# Patient Record
Sex: Female | Born: 1950 | Race: Black or African American | Hispanic: No | Smoking: Current every day smoker
Health system: Southern US, Community
[De-identification: ages and names within clinical notes are randomized; demographics above are authoritative.]

## PROBLEM LIST (undated history)

## (undated) DIAGNOSIS — N183 Chronic kidney disease, stage 3 unspecified: Secondary | ICD-10-CM

## (undated) DIAGNOSIS — I447 Left bundle-branch block, unspecified: Secondary | ICD-10-CM

## (undated) DIAGNOSIS — I10 Essential (primary) hypertension: Secondary | ICD-10-CM

## (undated) DIAGNOSIS — I340 Nonrheumatic mitral (valve) insufficiency: Secondary | ICD-10-CM

## (undated) DIAGNOSIS — I5022 Chronic systolic (congestive) heart failure: Secondary | ICD-10-CM

## (undated) DIAGNOSIS — I2510 Atherosclerotic heart disease of native coronary artery without angina pectoris: Secondary | ICD-10-CM

## (undated) DIAGNOSIS — I428 Other cardiomyopathies: Secondary | ICD-10-CM

## (undated) HISTORY — DX: Chronic kidney disease, stage 3 unspecified: N18.30

## (undated) HISTORY — DX: Left bundle-branch block, unspecified: I44.7

## (undated) HISTORY — DX: Nonrheumatic mitral (valve) insufficiency: I34.0

## (undated) HISTORY — DX: Other cardiomyopathies: I42.8

## (undated) HISTORY — DX: Atherosclerotic heart disease of native coronary artery without angina pectoris: I25.10

## (undated) HISTORY — DX: Chronic systolic (congestive) heart failure: I50.22

---

## 2004-08-30 ENCOUNTER — Emergency Department: Payer: Self-pay | Admitting: General Practice

## 2020-11-25 ENCOUNTER — Inpatient Hospital Stay
Admission: EM | Admit: 2020-11-25 | Discharge: 2020-12-09 | DRG: 286 | Disposition: A | Discharge status: Home / Self Care | Payer: Medicare PPO | Attending: Internal Medicine | Admitting: Internal Medicine

## 2020-11-25 ENCOUNTER — Emergency Department: Payer: Medicare PPO

## 2020-11-25 ENCOUNTER — Encounter: Payer: Self-pay | Admitting: Emergency Medicine

## 2020-11-25 ENCOUNTER — Other Ambulatory Visit: Payer: Self-pay

## 2020-11-25 DIAGNOSIS — K769 Liver disease, unspecified: Secondary | ICD-10-CM | POA: Diagnosis present

## 2020-11-25 DIAGNOSIS — I959 Hypotension, unspecified: Secondary | ICD-10-CM | POA: Diagnosis not present

## 2020-11-25 DIAGNOSIS — I5031 Acute diastolic (congestive) heart failure: Secondary | ICD-10-CM | POA: Diagnosis not present

## 2020-11-25 DIAGNOSIS — I43 Cardiomyopathy in diseases classified elsewhere: Secondary | ICD-10-CM | POA: Diagnosis present

## 2020-11-25 DIAGNOSIS — I248 Other forms of acute ischemic heart disease: Secondary | ICD-10-CM | POA: Diagnosis present

## 2020-11-25 DIAGNOSIS — R0781 Pleurodynia: Secondary | ICD-10-CM | POA: Diagnosis not present

## 2020-11-25 DIAGNOSIS — I5021 Acute systolic (congestive) heart failure: Secondary | ICD-10-CM | POA: Diagnosis not present

## 2020-11-25 DIAGNOSIS — Z79899 Other long term (current) drug therapy: Secondary | ICD-10-CM | POA: Diagnosis not present

## 2020-11-25 DIAGNOSIS — I5043 Acute on chronic combined systolic (congestive) and diastolic (congestive) heart failure: Secondary | ICD-10-CM | POA: Diagnosis present

## 2020-11-25 DIAGNOSIS — R000 Tachycardia, unspecified: Secondary | ICD-10-CM | POA: Diagnosis not present

## 2020-11-25 DIAGNOSIS — I5041 Acute combined systolic (congestive) and diastolic (congestive) heart failure: Secondary | ICD-10-CM | POA: Diagnosis not present

## 2020-11-25 DIAGNOSIS — E669 Obesity, unspecified: Secondary | ICD-10-CM | POA: Diagnosis present

## 2020-11-25 DIAGNOSIS — N170 Acute kidney failure with tubular necrosis: Secondary | ICD-10-CM | POA: Diagnosis not present

## 2020-11-25 DIAGNOSIS — Z8249 Family history of ischemic heart disease and other diseases of the circulatory system: Secondary | ICD-10-CM | POA: Diagnosis not present

## 2020-11-25 DIAGNOSIS — Z20822 Contact with and (suspected) exposure to covid-19: Secondary | ICD-10-CM | POA: Diagnosis present

## 2020-11-25 DIAGNOSIS — I2721 Secondary pulmonary arterial hypertension: Secondary | ICD-10-CM | POA: Diagnosis present

## 2020-11-25 DIAGNOSIS — I083 Combined rheumatic disorders of mitral, aortic and tricuspid valves: Secondary | ICD-10-CM | POA: Diagnosis present

## 2020-11-25 DIAGNOSIS — I2510 Atherosclerotic heart disease of native coronary artery without angina pectoris: Secondary | ICD-10-CM | POA: Diagnosis present

## 2020-11-25 DIAGNOSIS — I447 Left bundle-branch block, unspecified: Secondary | ICD-10-CM | POA: Diagnosis not present

## 2020-11-25 DIAGNOSIS — I160 Hypertensive urgency: Secondary | ICD-10-CM | POA: Diagnosis not present

## 2020-11-25 DIAGNOSIS — I420 Dilated cardiomyopathy: Secondary | ICD-10-CM | POA: Diagnosis not present

## 2020-11-25 DIAGNOSIS — Y92239 Unspecified place in hospital as the place of occurrence of the external cause: Secondary | ICD-10-CM | POA: Diagnosis not present

## 2020-11-25 DIAGNOSIS — R0602 Shortness of breath: Secondary | ICD-10-CM | POA: Diagnosis not present

## 2020-11-25 DIAGNOSIS — R7989 Other specified abnormal findings of blood chemistry: Secondary | ICD-10-CM | POA: Diagnosis present

## 2020-11-25 DIAGNOSIS — T501X5A Adverse effect of loop [high-ceiling] diuretics, initial encounter: Secondary | ICD-10-CM | POA: Diagnosis not present

## 2020-11-25 DIAGNOSIS — I130 Hypertensive heart and chronic kidney disease with heart failure and stage 1 through stage 4 chronic kidney disease, or unspecified chronic kidney disease: Secondary | ICD-10-CM | POA: Diagnosis present

## 2020-11-25 DIAGNOSIS — J90 Pleural effusion, not elsewhere classified: Secondary | ICD-10-CM

## 2020-11-25 DIAGNOSIS — R778 Other specified abnormalities of plasma proteins: Secondary | ICD-10-CM | POA: Diagnosis present

## 2020-11-25 DIAGNOSIS — I340 Nonrheumatic mitral (valve) insufficiency: Secondary | ICD-10-CM | POA: Diagnosis not present

## 2020-11-25 DIAGNOSIS — F17213 Nicotine dependence, cigarettes, with withdrawal: Secondary | ICD-10-CM | POA: Diagnosis not present

## 2020-11-25 DIAGNOSIS — R52 Pain, unspecified: Secondary | ICD-10-CM

## 2020-11-25 DIAGNOSIS — I10 Essential (primary) hypertension: Secondary | ICD-10-CM | POA: Diagnosis not present

## 2020-11-25 DIAGNOSIS — F17200 Nicotine dependence, unspecified, uncomplicated: Secondary | ICD-10-CM | POA: Diagnosis present

## 2020-11-25 DIAGNOSIS — F17210 Nicotine dependence, cigarettes, uncomplicated: Secondary | ICD-10-CM | POA: Diagnosis present

## 2020-11-25 DIAGNOSIS — R946 Abnormal results of thyroid function studies: Secondary | ICD-10-CM | POA: Diagnosis present

## 2020-11-25 DIAGNOSIS — Z6836 Body mass index (BMI) 36.0-36.9, adult: Secondary | ICD-10-CM

## 2020-11-25 DIAGNOSIS — I428 Other cardiomyopathies: Secondary | ICD-10-CM | POA: Diagnosis present

## 2020-11-25 DIAGNOSIS — I493 Ventricular premature depolarization: Secondary | ICD-10-CM | POA: Diagnosis not present

## 2020-11-25 DIAGNOSIS — N1831 Chronic kidney disease, stage 3a: Secondary | ICD-10-CM | POA: Diagnosis present

## 2020-11-25 DIAGNOSIS — I161 Hypertensive emergency: Secondary | ICD-10-CM | POA: Diagnosis present

## 2020-11-25 DIAGNOSIS — I509 Heart failure, unspecified: Secondary | ICD-10-CM

## 2020-11-25 DIAGNOSIS — R5381 Other malaise: Secondary | ICD-10-CM | POA: Diagnosis present

## 2020-11-25 DIAGNOSIS — J189 Pneumonia, unspecified organism: Secondary | ICD-10-CM

## 2020-11-25 HISTORY — DX: Essential (primary) hypertension: I10

## 2020-11-25 LAB — LIPID PANEL
Cholesterol: 162 mg/dL (ref 0–200)
HDL: 41 mg/dL (ref >40)
LDL Cholesterol: 101 mg/dL — ABNORMAL HIGH (ref 0–99)
Total CHOL/HDL Ratio: 4 RATIO
Triglycerides: 98 mg/dL (ref <150)
VLDL: 20 mg/dL (ref 0–40)

## 2020-11-25 LAB — COMPREHENSIVE METABOLIC PANEL
ALT: 8 U/L (ref 0–44)
AST: 28 U/L (ref 15–41)
Albumin: 4 g/dL (ref 3.5–5.0)
Alkaline Phosphatase: 45 U/L (ref 38–126)
Anion gap: 12 (ref 5–15)
BUN: 28 mg/dL — ABNORMAL HIGH (ref 8–23)
CO2: 21 mmol/L — ABNORMAL LOW (ref 22–32)
Calcium: 9.2 mg/dL (ref 8.9–10.3)
Chloride: 104 mmol/L (ref 98–111)
Creatinine, Ser: 1.3 mg/dL — ABNORMAL HIGH (ref 0.44–1.00)
GFR, Estimated: 45 mL/min — ABNORMAL LOW (ref >60)
Glucose, Bld: 134 mg/dL — ABNORMAL HIGH (ref 70–99)
Potassium: 3.6 mmol/L (ref 3.5–5.1)
Sodium: 137 mmol/L (ref 135–145)
Total Bilirubin: 1.6 mg/dL — ABNORMAL HIGH (ref 0.3–1.2)
Total Protein: 7.5 g/dL (ref 6.5–8.1)

## 2020-11-25 LAB — CBC WITH DIFFERENTIAL/PLATELET
Abs Immature Granulocytes: 0.01 10*3/uL (ref 0.00–0.07)
Basophils Absolute: 0.1 10*3/uL (ref 0.0–0.1)
Basophils Relative: 1 %
Eosinophils Absolute: 0.1 10*3/uL (ref 0.0–0.5)
Eosinophils Relative: 1 %
HCT: 41.9 % (ref 36.0–46.0)
Hemoglobin: 14.4 g/dL (ref 12.0–15.0)
Immature Granulocytes: 0 %
Lymphocytes Relative: 36 %
Lymphs Abs: 1.6 10*3/uL (ref 0.7–4.0)
MCH: 32.4 pg (ref 26.0–34.0)
MCHC: 34.4 g/dL (ref 30.0–36.0)
MCV: 94.2 fL (ref 80.0–100.0)
Monocytes Absolute: 0.4 10*3/uL (ref 0.1–1.0)
Monocytes Relative: 8 %
Neutro Abs: 2.4 10*3/uL (ref 1.7–7.7)
Neutrophils Relative %: 54 %
Platelets: 169 10*3/uL (ref 150–400)
RBC: 4.45 MIL/uL (ref 3.87–5.11)
RDW: 13.6 % (ref 11.5–15.5)
WBC: 4.5 10*3/uL (ref 4.0–10.5)
nRBC: 0 % (ref 0.0–0.2)

## 2020-11-25 LAB — MAGNESIUM: Magnesium: 2.2 mg/dL (ref 1.7–2.4)

## 2020-11-25 LAB — RESP PANEL BY RT-PCR (FLU A&B, COVID) ARPGX2
Influenza A by PCR: NEGATIVE
Influenza B by PCR: NEGATIVE
SARS Coronavirus 2 by RT PCR: NEGATIVE

## 2020-11-25 LAB — TROPONIN I (HIGH SENSITIVITY)
Troponin I (High Sensitivity): 149 ng/L (ref <18)
Troponin I (High Sensitivity): 134 ng/L (ref <18)

## 2020-11-25 LAB — HEMOGLOBIN A1C
Hgb A1c MFr Bld: 6.4 % — ABNORMAL HIGH (ref 4.8–5.6)
Mean Plasma Glucose: 137 mg/dL

## 2020-11-25 LAB — PROTIME-INR
INR: 1.2 (ref 0.8–1.2)
Prothrombin Time: 15 seconds (ref 11.4–15.2)

## 2020-11-25 LAB — APTT: aPTT: 26 seconds (ref 24–36)

## 2020-11-25 LAB — BRAIN NATRIURETIC PEPTIDE: B Natriuretic Peptide: 1370.8 pg/mL — ABNORMAL HIGH (ref 0.0–100.0)

## 2020-11-25 MED ORDER — ACETAMINOPHEN 325 MG PO TABS
650.0000 mg | ORAL_TABLET | ORAL | Status: DC | PRN
  Administered 2020-11-27 – 2020-12-02 (×11): 650 mg via ORAL
  Filled 2020-11-25 (×12): qty 2

## 2020-11-25 MED ORDER — SODIUM CHLORIDE 0.9% FLUSH: 3.0000 mL | INTRAVENOUS | Status: DC | PRN

## 2020-11-25 MED ORDER — METOPROLOL SUCCINATE ER 50 MG PO TB24
50.0000 mg | ORAL_TABLET | Freq: Every day | ORAL | Status: DC
  Administered 2020-11-25 – 2020-11-26 (×2): 50 mg via ORAL
  Filled 2020-11-25 (×2): qty 1

## 2020-11-25 MED ORDER — IOHEXOL 350 MG/ML SOLN
75.0000 mL | Freq: Once | INTRAVENOUS | Status: AC | PRN
  Administered 2020-11-25: 75 mL via INTRAVENOUS

## 2020-11-25 MED ORDER — ENOXAPARIN SODIUM 40 MG/0.4ML IJ SOSY
40.0000 mg | PREFILLED_SYRINGE | INTRAMUSCULAR | Status: DC
  Administered 2020-11-26 – 2020-11-28 (×2): 40 mg via SUBCUTANEOUS
  Filled 2020-11-25 (×3): qty 0.4

## 2020-11-25 MED ORDER — NITROGLYCERIN 2 % TD OINT
0.5000 [in_us] | TOPICAL_OINTMENT | Freq: Four times a day (QID) | TRANSDERMAL | Status: DC
  Administered 2020-11-25 – 2020-11-26 (×3): 0.5 [in_us] via TOPICAL
  Filled 2020-11-25 (×3): qty 1

## 2020-11-25 MED ORDER — SODIUM CHLORIDE 0.9% FLUSH
3.0000 mL | Freq: Two times a day (BID) | INTRAVENOUS | Status: DC
  Administered 2020-11-26 – 2020-12-06 (×19): 3 mL via INTRAVENOUS

## 2020-11-25 MED ORDER — FUROSEMIDE 10 MG/ML IJ SOLN
40.0000 mg | Freq: Once | INTRAMUSCULAR | Status: AC
  Administered 2020-11-25: 40 mg via INTRAVENOUS
  Filled 2020-11-25: qty 4

## 2020-11-25 MED ORDER — FUROSEMIDE 10 MG/ML IJ SOLN
40.0000 mg | Freq: Every day | INTRAMUSCULAR | Status: DC
  Administered 2020-11-26: 40 mg via INTRAVENOUS
  Filled 2020-11-25: qty 4

## 2020-11-25 MED ORDER — SODIUM CHLORIDE 0.9 % IV SOLN: 250.0000 mL | INTRAVENOUS | Status: DC | PRN

## 2020-11-25 MED ORDER — ONDANSETRON HCL 4 MG/2ML IJ SOLN
4.0000 mg | Freq: Four times a day (QID) | INTRAMUSCULAR | Status: DC | PRN
  Administered 2020-11-26: 4 mg via INTRAVENOUS
  Filled 2020-11-25: qty 2

## 2020-11-25 MED ORDER — SODIUM CHLORIDE 0.9 % IV SOLN: INTRAVENOUS | Status: DC

## 2020-11-25 NOTE — ED Notes (Signed)
Pt provided blanket per request

## 2020-11-25 NOTE — Consult Note (Signed)
Cardiology Consultation:   Patient ID: Evelyn Dunn MRN: [id number]; DOB: Oct 21, 1950  Admit date: 11/25/2020 Date of Consult: 11/25/2020  PCP:  No primary care provider on file.   [facility] Providers Cardiologist: New - Olsen Mccutchan   Patient Profile:   Evelyn Dunn is a 70 y.o. female with no significant past medical history (has not seen a doctor in at least 3 to 4 years), who is being seen 11/25/2020 for the evaluation of abnormal EKG at the request of Dr. Fuller Plan.  History of Present Illness:   Evelyn Dunn began to notice shortness of breath over the last 3 to 4 days as well as intermittent nausea (especially when waking up in the morning).  Her shortness of breath seems to be most pronounced when she is anxious or when lying down.  At times, she feels like she has been wheezing a bit.  She has needed to prop her self up at night the last few days to help her breathing.  She denies chest pain, palpitations, lightheadedness, and edema.  This morning, she felt anxious and short of breath after having an argument with her brother.  When EMS was summoned, left bundle branch block was identified on EKG (no prior tracing is available in our system).  Patient was noted to be quite hypertensive when EMS arrived.  She was given aspirin and nitroglycerin paste.  At this time, she still feels a little short of breath but otherwise is without symptoms.  Past medical history: None  History reviewed. No pertinent surgical history.   Home medications: None  Allergies:   No Known Allergies  Social History:   Social History   Tobacco Use   Smoking status: Every Day    Packs/day: 0.25    Pack years: 0.00    Types: Cigarettes   Smokeless tobacco: Never  Substance Use Topics   Alcohol use: Yes    Alcohol/week: 4.0 standard drinks    Types: 4 Standard drinks or equivalent per week   Drug use: Never     Family History:   Family History  Problem Relation Age of Onset   Vascular Disease  Brother      ROS:  Please see the history of present illness. All other ROS reviewed and negative.     Physical Exam/Data:   Vitals:   11/25/20 1050 11/25/20 1054 11/25/20 1059  BP:  (!) 182/114   Pulse: (!) 112    Resp: 16    Temp:   97.8 F (36.6 C)  TempSrc:   Oral  SpO2: 96%    Weight: 97.7 kg     No intake or output data in the 24 hours ending 11/25/20 1105 Last 3 Weights 11/25/2020  Weight (lbs) 215 lb 6.4 oz  Weight (kg) 97.705 kg     There is no height or weight on file to calculate BMI.  General:  Well nourished, well developed, in no acute distress HEENT: normal Lymph: no adenopathy Neck: JVP approximately 8 to 10 cm without HJR. Endocrine:  No thryomegaly Vascular: No carotid bruits; FA pulses 2+ bilaterally without bruits  Cardiac: Tachycardic but regular without murmurs, rubs, or gallops. Lungs:  clear to auscultation bilaterally, no wheezing, rhonchi or rales  Abd: soft, nontender, no hepatomegaly  Ext: no edema Musculoskeletal:  No deformities, BUE and BLE strength normal and equal Skin: warm and dry  Neuro:  CNs 2-12 intact, no focal abnormalities noted Psych:  Normal affect   EKG:  The EKG was personally  reviewed and demonstrates: Sinus tachycardia with left bundle branch block.  No prior tracing available for comparison.  Relevant CV Studies: None.  Laboratory Data:  High Sensitivity Troponin:  No results for input(s): TROPONINIHS in the last 720 hours.   ChemistryNo results for input(s): NA, K, CL, CO2, GLUCOSE, BUN, CREATININE, CALCIUM, GFRNONAA, GFRAA, ANIONGAP in the last 168 hours.  No results for input(s): PROT, ALBUMIN, AST, ALT, ALKPHOS, BILITOT in the last 168 hours. HematologyNo results for input(s): WBC, RBC, HGB, HCT, MCV, MCH, MCHC, RDW, PLT in the last 168 hours. BNPNo results for input(s): BNP, PROBNP in the last 168 hours.  DDimer No results for input(s): DDIMER in the last 168 hours.   Radiology/Studies:  No results  found.   Assessment and Plan:   LBBB: Patient presents with 3 to 4-day history of shortness of breath and intermittent nausea, found to have LBBB of uncertain chronicity on EKG.  She does not have any angina.  EKG does not meet Sgarbossa criteria.  While her dyspnea and orthopnea are concerning for heart failure, findings are not suggestive of STEMI.  Particularly given her lack of medical care over the last several years, I think that the risks of emergent catheterization outweigh benefits.  It would be helpful to obtain labs to assess renal function and hemoglobin before considering an invasive procedure like catheterization.  High sensitive troponin I should be trended.  I think is reasonable to defer aspirin at this time pending further evaluation.  Heart failure: Patient with symptoms of acute heart failure developing over the last 3 to 4 days.  She does not have any edema but exhibits JVD on exam.  She denies a history of heart disease but does not see a physician regularly.  She is also noted to be severely hypertensive.  Transthoracic echocardiogram and BNP should be checked.  Consider addition of low-dose beta-blocker in the setting of hypertension and tachycardia.  Hypertensive emergency: Patient quite hypertensive on arrival with associated dyspnea.  Concern for heart failure is also present (see above).  Consider IV nitroglycerin to help with blood pressure control and decongestion in the short-term, as well as addition of low-dose beta-blocker as noted above.  Patient may also benefit from diuresis, particularly if chest radiograph and BNP support diagnosis of heart failure.    For questions or updates, please contact [facility] Please consult [email or web address] for contact info under [facility].  Signed, Yvonne Kendall, MD  11/25/2020 11:05 AM

## 2020-11-25 NOTE — ED Notes (Signed)
Artis Delay, MD notified of troponin of 149 at this time.

## 2020-11-25 NOTE — ED Provider Notes (Signed)
[facility] Emergency Department Provider Note  ____________________________________________   Event Date/Time   First MD Initiated Contact with Patient 11/25/20 1054     (approximate)  I have reviewed the triage vital signs and the nursing notes.   HISTORY  Chief Complaint Code STEMI    HPI Evelyn Dunn is a 70 y.o. female who is otherwise healthy but has not seen a doctor for years who comes in for shortness of breath and nausea.  Initial thought was for anxiety as her brother and her had a disagreement.  When EMS got there she was noted to have a left bundle branch block but no prior EKGs.  She denied any chest pain but has had some shortness of breath and congestion for the past 4 days.  Shortness of breath is worse at nighttime and laying flat.  Nothing makes it better.  She reports some associated nausea that started today but denies any at this time.  Patient was given 324 of aspirin and 1 inch of Nitropaste.  She does report a history of smoking and drinking "night" Daily.  Denies Any Drug Use.  Denies Any Recent Falls.  Has Had 2 COVID Vaccines but Not the Booster.  Denies Any Fevers.     Medical: None     Prior to Admission medications   Not on File    Allergies Patient has no known allergies.  No family history on file.  Social History Social History   Tobacco Use   Smoking status: Every Day    Pack years: 0.00    Types: Cigarettes   Smokeless tobacco: Never  Substance Use Topics   Alcohol use: Yes   Drug use: Not Currently      Review of Systems Constitutional: No fever/chills Eyes: No visual changes. ENT: No sore throat.  Positive congestion Cardiovascular: No chest pain Respiratory: Positive shortness of breath, cough Gastrointestinal: No abdominal pain.  Positive nausea, no vomiting.  No diarrhea.  No constipation. Genitourinary: Negative for dysuria. Musculoskeletal: Negative for back pain. Skin: Negative for  rash. Neurological: Negative for headaches, focal weakness or numbness. All other ROS negative ____________________________________________   PHYSICAL EXAM:  VITAL SIGNS: ED Triage Vitals  Enc Vitals Group     BP 11/25/20 1054 (!) 182/114     Pulse Rate 11/25/20 1050 (!) 112     Resp 11/25/20 1050 16     Temp --      Temp src --      SpO2 11/25/20 1050 96 %     Weight 11/25/20 1050 215 lb 6.4 oz (97.7 kg)     Height --      Head Circumference --      Peak Flow --      Pain Score 11/25/20 1050 0     Pain Loc --      Pain Edu? --      Excl. in GC? --     Constitutional: Alert and oriented. Well appearing and in no acute distress. Eyes: Conjunctivae are normal. EOMI. Head: Atraumatic. Nose: No congestion/rhinnorhea. Mouth/Throat: Mucous membranes are moist.   Neck: No stridor. Trachea Midline. FROM Cardiovascular: Tachycardic, regular rhythm. Grossly normal heart sounds.  Good peripheral circulation. Respiratory: Normal respiratory effort.  No retractions. Lungs CTAB. Gastrointestinal: Soft and nontender. No distention. No abdominal bruits.  Musculoskeletal: No lower extremity tenderness nor edema.  No joint effusions. Neurologic:  Normal speech and language. No gross focal neurologic deficits are appreciated.  Skin:  Skin  is warm, dry and intact. No rash noted. Psychiatric: Mood and affect are normal. Speech and behavior are normal. GU: Deferred   ____________________________________________   LABS (all labs ordered are listed, but only abnormal results are displayed)  Labs Reviewed  COMPREHENSIVE METABOLIC PANEL - Abnormal; Notable for the following components:      Result Value   CO2 21 (*)    Glucose, Bld 134 (*)    BUN 28 (*)    Creatinine, Ser 1.30 (*)    Total Bilirubin 1.6 (*)    GFR, Estimated 45 (*)    All other components within normal limits  LIPID PANEL - Abnormal; Notable for the following components:   LDL Cholesterol 101 (*)    All other  components within normal limits  BRAIN NATRIURETIC PEPTIDE - Abnormal; Notable for the following components:   B Natriuretic Peptide 1,370.8 (*)    All other components within normal limits  TROPONIN I (HIGH SENSITIVITY) - Abnormal; Notable for the following components:   Troponin I (High Sensitivity) 149 (*)    All other components within normal limits  TROPONIN I (HIGH SENSITIVITY) - Abnormal; Notable for the following components:   Troponin I (High Sensitivity) 134 (*)    All other components within normal limits  RESP PANEL BY RT-PCR (FLU A&B, COVID) ARPGX2  CBC WITH DIFFERENTIAL/PLATELET  PROTIME-INR  APTT  MAGNESIUM  HEMOGLOBIN A1C   ____________________________________________   ED ECG REPORT I, Concha Se, the attending physician, personally viewed and interpreted this ECG.  Sinus tachycardia rate of 113 with left bundle branch block, T wave inversions in 1, aVL V5 and V6, negative scar Bosa ____________________________________________  RADIOLOGY Vela Prose, personally viewed and evaluated these images (plain radiographs) as part of my medical decision making, as well as reviewing the written report by the radiologist.  ED MD interpretation:  fullness r bronchi   Official radiology report(s): CT Angio Chest PE W and/or Wo Contrast  Result Date: 11/25/2020 CLINICAL DATA:  Shortness of breath.  PE suspected. EXAM: CT ANGIOGRAPHY CHEST WITH CONTRAST TECHNIQUE: Multidetector CT imaging of the chest was performed using the standard protocol during bolus administration of intravenous contrast. Multiplanar CT image reconstructions and MIPs were obtained to evaluate the vascular anatomy. CONTRAST:  49mL OMNIPAQUE IOHEXOL 350 MG/ML SOLN COMPARISON:  None. FINDINGS: Cardiovascular: Heart is enlarged. No pericardial effusion. Coronary artery calcification is evident. Atherosclerotic calcification is noted in the wall of the thoracic aorta. Enlargement of the pulmonary outflow  tract and main pulmonary arteries suggests pulmonary arterial hypertension. There is no large central pulmonary embolus in the main pulmonary outflow tract or either main pulmonary artery. No evidence for lobar pulmonary embolic disease. Segmental and subsegmental branches to both lung show no definite pulmonary embolus although assessment is markedly degraded by breathing motion and may be unreliable. Mediastinum/Nodes: Scattered small mediastinal lymph nodes are evident. 14 mm short axis subcarinal lymph node is mildly enlarged. 11 mm short axis lymph node identified in the right hilum. The esophagus has normal imaging features. There is no axillary lymphadenopathy. Lungs/Pleura: Fine parenchymal detail the lungs is obscured by breathing motion. Patchy ground-glass attenuation in the dependent lower lungs bilaterally is probably atelectasis although infectious/inflammatory airspace disease cannot be excluded. Small bilateral pleural effusions noted, right greater than left. Upper Abdomen: Reflux of injected contrast into the IVC and hepatic veins suggests right heart dysfunction. 8 mm hypervascular lesion in the medial segment left liver on 75/4 cannot be further characterized. 1.7 cm  low-density lesion posteriorly in the lateral segment left liver cannot be definitively characterized but is likely benign. Layering tiny gallstones evident. Musculoskeletal: No worrisome lytic or sclerotic osseous abnormality. Review of the MIP images confirms the above findings. IMPRESSION: 1. Limited study due to breathing motion artifact. No large central pulmonary embolus evident. Segmental and subsegmental branches to both lung show no definite pulmonary embolus although assessment is markedly degraded by breathing motion and may be unreliable. 2. Enlargement of the pulmonary outflow tract and main pulmonary arteries suggests pulmonary arterial hypertension. 3. Reflux of intravenous contrast into the IVC and hepatic veins  suggests right heart dysfunction. 4. Small bilateral pleural effusions, right greater than left. 5. Patchy ground-glass attenuation in the dependent lower lungs bilaterally is probably atelectasis although infectious/inflammatory airspace disease cannot be excluded. 6. Mild mediastinal and right hilar lymphadenopathy, potentially reactive. 7. 8 mm hypervascular lesion in the medial segment left liver is associated with a low-density lesion in the posterior aspect of the lateral segment left liver. Follow-up non emergent outpatient MRI without and with contrast after the patient recovers from this acute episode is recommended. 8. Cholelithiasis. 9. Aortic Atherosclerosis (ICD10-I70.0). Electronically Signed   By: Kennith Center M.D.   On: 11/25/2020 13:01   DG Chest Portable 1 View  Result Date: 11/25/2020 CLINICAL DATA:  Shortness of breath. EXAM: PORTABLE CHEST 1 VIEW COMPARISON:  None FINDINGS: Image rotated to the RIGHT. Cardiomediastinal contours accounting for this may be normal though there is fullness of the RIGHT hilum which is asymmetric. Added density projecting over the RIGHT mainstem bronchus. EKG leads project over the chest. No lobar consolidation. No sign of pleural effusion on frontal radiograph. No acute skeletal process on limited assessment. IMPRESSION: 1. Asymmetric fullness of the RIGHT hilum and density projecting over the RIGHT mainstem bronchus. Fullness of hilum could be due to rotation or hilar adenopathy. 2. Question of material in the RIGHT mainstem bronchus. While some of this could be due to artifact due to rotation would suggest consideration for PA and lateral chest when the patient is able or CT as warranted. Electronically Signed   By: Donzetta Kohut M.D.   On: 11/25/2020 11:34    ____________________________________________   PROCEDURES  Procedure(s) performed (including Critical Care):  .1-3 Lead EKG Interpretation  Date/Time: 11/25/2020 10:58 AM Performed by: Concha Se, MD Authorized by: Concha Se, MD     Interpretation: abnormal     ECG rate:  110s   ECG rate assessment: tachycardic     Rhythm: sinus tachycardia     Ectopy: none     Conduction: normal   .Critical Care  Date/Time: 11/25/2020 2:32 PM Performed by: Concha Se, MD Authorized by: Concha Se, MD   Critical care provider statement:    Critical care time (minutes):  35   Critical care was necessary to treat or prevent imminent or life-threatening deterioration of the following conditions: CHF needing lasix.   Critical care was time spent personally by me on the following activities:  Discussions with consultants, evaluation of patient's response to treatment, examination of patient, ordering and performing treatments and interventions, ordering and review of laboratory studies, ordering and review of radiographic studies, pulse oximetry, re-evaluation of patient's condition, obtaining history from patient or surrogate and review of old charts   ____________________________________________   INITIAL IMPRESSION / ASSESSMENT AND PLAN / ED COURSE   Evelyn Dunn was evaluated in Emergency Department on 11/25/2020 for the symptoms described in the history  of present illness. She was evaluated in the context of the global COVID-19 pandemic, which necessitated consideration that the patient might be at risk for infection with the SARS-CoV-2 virus that causes COVID-19. Institutional protocols and algorithms that pertain to the evaluation of patients at risk for COVID-19 are in a state of rapid change based on information released by regulatory bodies including the CDC and federal and state organizations. These policies and algorithms were followed during the patient's care in the ED.    Most Likely DDx:  -STEMI was called from EMS.  Dr. Okey Dupre was at bedside for evaluation but STEMI was deactivated due to patient's story and EKG only showing a left bundle branch block.  Given patient's  shortness of breath I most concerned about the possibility of pulmonary embolism versus COVID.  Will get further work-up to evaluate.   DDx that was also considered d/t potential to cause harm, but was found less likely based on history and physical (as detailed above): -PNA (no fevers, cough but CXR to evaluate) -PNX (reassured with equal b/l breath sounds, CXR to evaluate) -Symptomatic anemia (will get H&H) -Pulmonary embolism as no sob at rest, not pleuritic in nature, no hypoxia -Aortic Dissection as no tearing pain and no radiation to the mid back, pulses equal -Pericarditis no rub on exam, EKG changes or hx to suggest dx -Tamponade (no notable SOB, tachycardic, hypotensive) -Esophageal rupture (no h/o diffuse vomitting/no crepitus)  2:14 PM initial cardiac marker was elevated and BNP is elevated.  CT scan without evidence of PE but does have evidence of pulmonary arterial hypertension as well as right heart dysfunction with some small effusions.  This is most likely the cause of patient's shortness of breath.  Will give dose of IV Lasix.  Patient also noted to have a lesion on the liver that needs outpatient follow-up.  I did discuss this with patient. She is aware she needs outpt MRI   Patient's repeat troponin is downtrending therefore we will hold off on heparin.  I did let cardiology know about patient's results.  Will discuss hospital team for admission         ____________________________________________   FINAL CLINICAL IMPRESSION(S) / ED DIAGNOSES   Final diagnoses:  Tachycardia  Shortness of breath  Pleural effusion  LBBB (left bundle branch block)  Congestive heart failure, unspecified HF chronicity, unspecified heart failure type (HCC)     MEDICATIONS GIVEN DURING THIS VISIT:  Medications  iohexol (OMNIPAQUE) 350 MG/ML injection 75 mL (75 mLs Intravenous Contrast Given 11/25/20 1213)  furosemide (LASIX) injection 40 mg (40 mg Intravenous Given 11/25/20 1433)      ED Discharge Orders     None        Note:  This document was prepared using Dragon voice recognition software and may include unintentional dictation errors.    Concha Se, MD 11/25/20 1434

## 2020-11-25 NOTE — ED Notes (Signed)
Pt assisted to commode at this time.  Pt noted to have BM.  Pt ambulated with steady gait to toilet, but was noticeably winded.

## 2020-11-25 NOTE — ED Triage Notes (Signed)
Pt to ED via [facility] from home for nausea and shortness of breath. Per EMS they were called out for anxiety because she and her brother were having a disagreement. Per EMS pt has not seen a PCP in about 3 years, pt not known to have BBB, EMS 12 lead showing left BBB. Pt hypertensive. Pt was given 324 mg of ASA and 1 inch of Ntg paste was placed on left chest. Pt is a smoker. Unknown medical history. Pt is A & O on arrival

## 2020-11-25 NOTE — H&P (Addendum)
History and Physical    Evelyn Dunn:[id number] DOB: 1951/04/19 DOA: 11/25/2020  PCP: Pcp, No   Patient coming from: Home  I have personally briefly reviewed patient's old medical records in [facility]  Chief Complaint: Shortness of breath  HPI: Evelyn Dunn is a 70 y.o. female with medical history significant for nicotine dependence and hypertension who was brought into the ER by EMS after they were called out for evaluation of what was initially thought to be an anxiety episode.  Patient was having a disagreement with her brother when all of a sudden she became very short of breath and nauseous.  She states that she has had shortness of breath for the last 4 days but worse on the day of admission.  Shortness of breath is not related to rest or exertion and it is associated with 3 pillow orthopnea.  She denies having any lower extremity swelling or PND.  She complains of nausea but denies having any vomiting She denies having any chest pain, no diaphoresis, no palpitations, no dizziness, no lightheadedness, no headache, no changes in her bowel habits, no urinary symptoms, no weakness, no focal deficits, no fever, no chills, no cough, no blurred vision. Labs show sodium 137, potassium 3.6, chloride 104, bicarb 21, glucose 134, BUN 28, creatinine 1.30, calcium 9.2, magnesium 2.2, alkaline phosphatase 45, albumin 4.0, AST 28, ALT 8, total protein 7.5, BNP 1370, troponin 149 >> 134, white count 4.5, hemoglobin 14, hematocrit 41.9, MCV 94.2, RDW 13.6, platelet count 169, PT 15, INR 1.2 Respiratory viral panel is negative Chest x-ray reviewed by me shows asymmetric fullness of the RIGHT hilum and density projecting over the RIGHT mainstem bronchus. Fullness of hilum could be due to rotation or hilar adenopathy. Question of material in the RIGHT mainstem bronchus. While some of this could be due to artifact due to rotation would suggest consideration for PA and lateral chest when the  patient is able or CT as warranted. CT angiogram of the chest shows limited study due to breathing motion artifact. No large central pulmonary embolus evident. Segmental and subsegmental branches to both lung show no definite pulmonary embolus although assessment is markedly degraded by breathing motion and may be unreliable. Enlargement of the pulmonary outflow tract and main pulmonary arteries suggests pulmonary arterial hypertension. Reflux of intravenous contrast into the IVC and hepatic veins suggests right heart dysfunction.Small bilateral pleural effusions, right greater than left. Patchy ground-glass attenuation in the dependent lower lungs bilaterally is probably atelectasis although infectious/inflammatory airspace disease cannot be excluded. Mild mediastinal and right hilar lymphadenopathy, potentially reactive.  It is currently 8 mm hypervascular lesion in the medial segment left liver is associated with a low-density lesion in the posterior aspect of the lateral segment left liver. Follow-up non emergent outpatient MRI without and with contrast after the patient recovers from this acute episode is recommended. Cholelithiasis. Aortic Atherosclerosis  Twelve-lead EKG shows sinus tachycardia with a left bundle branch block of unclear chronicity   ED Course: Patient is a 70 year old female who presents to the ER via EMS for evaluation of shortness of breath associated with 3 pillow orthopnea which she has had for 4 days. Imaging shows bilateral pleural effusions and patient's blood pressure was significantly elevated when she arrived to the emergency room with blood pressure 182/114.  She initially presented to the ER as a code STEMI because her twelve-lead EKG showed a left bundle branch block and there was no prior ones to compare with.  She was seen in consultation by cardiology and code STEMI was discontinued. She received a dose of IV Lasix in the ER and will be admitted to the  hospital for further evaluation.      Review of Systems: As per HPI otherwise all other systems reviewed and negative.    Past Medical History:  Diagnosis Date   Hypertension     History reviewed. No pertinent surgical history.   reports that she has been smoking cigarettes. She has been smoking an average of 0.25 packs per day. She has never used smokeless tobacco. She reports current alcohol use of about 5.0 standard drinks of alcohol per week. She reports that she does not use drugs.  No Known Allergies  Family History  Problem Relation Age of Onset   Valvular heart disease Mother    Heart failure Father    Coronary artery disease Sister    Vascular Disease Brother       Prior to Admission medications   Medication Sig Start Date End Date Taking? Authorizing Provider  diphenhydrAMINE (BENADRYL) 25 MG tablet Take 25 mg by mouth every 6 (six) hours as needed.   Yes [provider]  lansoprazole (PREVACID) 15 MG capsule Take 15 mg by mouth daily at 12 noon.   Yes [provider]    Physical Exam: Vitals:   11/25/20 1544 11/25/20 1545 11/25/20 1600 11/25/20 1615  BP: (!) 153/115  (!) 137/96   Pulse: (!) 109 (!) 110 (!) 106 (!) 109  Resp:  (!) 23 (!) 35 (!) 21  Temp:      TempSrc:      SpO2:  92% 92% 95%  Weight:         Vitals:   11/25/20 1544 11/25/20 1545 11/25/20 1600 11/25/20 1615  BP: (!) 153/115  (!) 137/96   Pulse: (!) 109 (!) 110 (!) 106 (!) 109  Resp:  (!) 23 (!) 35 (!) 21  Temp:      TempSrc:      SpO2:  92% 92% 95%  Weight:          Constitutional: Alert and oriented x 3 . Not in any apparent distress HEENT:      Head: Normocephalic and atraumatic.         Eyes: PERLA, EOMI, Conjunctivae are normal. Sclera is non-icteric.       Mouth/Throat: Mucous membranes are moist.       Neck: Supple with no signs of meningismus. Cardiovascular: Tachycardic. No murmurs, gallops, or rubs. 2+ symmetrical distal pulses are present . No  JVD. No LE edema Respiratory: Respiratory effort normal .crackles at the lung bases bilaterally. No wheezes or rhonchi.  Gastrointestinal: Soft, non tender, and non distended with positive bowel sounds.  Genitourinary: No CVA tenderness. Musculoskeletal: Nontender with normal range of motion in all extremities. No cyanosis, or erythema of extremities. Neurologic:  Face is symmetric. Moving all extremities. No gross focal neurologic deficits . Skin: Skin is warm, dry.  No rash or ulcers Psychiatric: Mood and affect are normal    Labs on Admission: I have personally reviewed following labs and imaging studies  CBC: Recent Labs  Lab 11/25/20 1053  WBC 4.5  NEUTROABS 2.4  HGB 14.4  HCT 41.9  MCV 94.2  PLT 169   Basic Metabolic Panel: Recent Labs  Lab 11/25/20 1053  NA 137  K 3.6  CL 104  CO2 21*  GLUCOSE 134*  BUN 28*  CREATININE 1.30*  CALCIUM 9.2  MG  2.2   GFR: CrCl cannot be calculated (Unknown ideal weight.). Liver Function Tests: Recent Labs  Lab 11/25/20 1053  AST 28  ALT 8  ALKPHOS 45  BILITOT 1.6*  PROT 7.5  ALBUMIN 4.0   No results for input(s): LIPASE, AMYLASE in the last 168 hours. No results for input(s): AMMONIA in the last 168 hours. Coagulation Profile: Recent Labs  Lab 11/25/20 1053  INR 1.2   Cardiac Enzymes: No results for input(s): CKTOTAL, CKMB, CKMBINDEX, TROPONINI in the last 168 hours. BNP (last 3 results) No results for input(s): PROBNP in the last 8760 hours. HbA1C: No results for input(s): HGBA1C in the last 72 hours. CBG: No results for input(s): GLUCAP in the last 168 hours. Lipid Profile: Recent Labs    11/25/20 1053  CHOL 162  HDL 41  LDLCALC 101*  TRIG 98  CHOLHDL 4.0   Thyroid Function Tests: No results for input(s): TSH, T4TOTAL, FREET4, T3FREE, THYROIDAB in the last 72 hours. Anemia Panel: No results for input(s): VITAMINB12, FOLATE, FERRITIN, TIBC, IRON, RETICCTPCT in the last 72 hours. Urine analysis: No  results found for: COLORURINE, APPEARANCEUR, LABSPEC, PHURINE, GLUCOSEU, HGBUR, BILIRUBINUR, KETONESUR, PROTEINUR, UROBILINOGEN, NITRITE, LEUKOCYTESUR  Radiological Exams on Admission: CT Angio Chest PE W and/or Wo Contrast  Result Date: 11/25/2020 CLINICAL DATA:  Shortness of breath.  PE suspected. EXAM: CT ANGIOGRAPHY CHEST WITH CONTRAST TECHNIQUE: Multidetector CT imaging of the chest was performed using the standard protocol during bolus administration of intravenous contrast. Multiplanar CT image reconstructions and MIPs were obtained to evaluate the vascular anatomy. CONTRAST:  41mL OMNIPAQUE IOHEXOL 350 MG/ML SOLN COMPARISON:  None. FINDINGS: Cardiovascular: Heart is enlarged. No pericardial effusion. Coronary artery calcification is evident. Atherosclerotic calcification is noted in the wall of the thoracic aorta. Enlargement of the pulmonary outflow tract and main pulmonary arteries suggests pulmonary arterial hypertension. There is no large central pulmonary embolus in the main pulmonary outflow tract or either main pulmonary artery. No evidence for lobar pulmonary embolic disease. Segmental and subsegmental branches to both lung show no definite pulmonary embolus although assessment is markedly degraded by breathing motion and may be unreliable. Mediastinum/Nodes: Scattered small mediastinal lymph nodes are evident. 14 mm short axis subcarinal lymph node is mildly enlarged. 11 mm short axis lymph node identified in the right hilum. The esophagus has normal imaging features. There is no axillary lymphadenopathy. Lungs/Pleura: Fine parenchymal detail the lungs is obscured by breathing motion. Patchy ground-glass attenuation in the dependent lower lungs bilaterally is probably atelectasis although infectious/inflammatory airspace disease cannot be excluded. Small bilateral pleural effusions noted, right greater than left. Upper Abdomen: Reflux of injected contrast into the IVC and hepatic veins suggests  right heart dysfunction. 8 mm hypervascular lesion in the medial segment left liver on 75/4 cannot be further characterized. 1.7 cm low-density lesion posteriorly in the lateral segment left liver cannot be definitively characterized but is likely benign. Layering tiny gallstones evident. Musculoskeletal: No worrisome lytic or sclerotic osseous abnormality. Review of the MIP images confirms the above findings. IMPRESSION: 1. Limited study due to breathing motion artifact. No large central pulmonary embolus evident. Segmental and subsegmental branches to both lung show no definite pulmonary embolus although assessment is markedly degraded by breathing motion and may be unreliable. 2. Enlargement of the pulmonary outflow tract and main pulmonary arteries suggests pulmonary arterial hypertension. 3. Reflux of intravenous contrast into the IVC and hepatic veins suggests right heart dysfunction. 4. Small bilateral pleural effusions, right greater than left. 5. Patchy ground-glass attenuation  in the dependent lower lungs bilaterally is probably atelectasis although infectious/inflammatory airspace disease cannot be excluded. 6. Mild mediastinal and right hilar lymphadenopathy, potentially reactive. 7. 8 mm hypervascular lesion in the medial segment left liver is associated with a low-density lesion in the posterior aspect of the lateral segment left liver. Follow-up non emergent outpatient MRI without and with contrast after the patient recovers from this acute episode is recommended. 8. Cholelithiasis. 9. Aortic Atherosclerosis (ICD10-I70.0). Electronically Signed   By: Kennith Center M.D.   On: 11/25/2020 13:01   DG Chest Portable 1 View  Result Date: 11/25/2020 CLINICAL DATA:  Shortness of breath. EXAM: PORTABLE CHEST 1 VIEW COMPARISON:  None FINDINGS: Image rotated to the RIGHT. Cardiomediastinal contours accounting for this may be normal though there is fullness of the RIGHT hilum which is asymmetric. Added  density projecting over the RIGHT mainstem bronchus. EKG leads project over the chest. No lobar consolidation. No sign of pleural effusion on frontal radiograph. No acute skeletal process on limited assessment. IMPRESSION: 1. Asymmetric fullness of the RIGHT hilum and density projecting over the RIGHT mainstem bronchus. Fullness of hilum could be due to rotation or hilar adenopathy. 2. Question of material in the RIGHT mainstem bronchus. While some of this could be due to artifact due to rotation would suggest consideration for PA and lateral chest when the patient is able or CT as warranted. Electronically Signed   By: Donzetta Kohut M.D.   On: 11/25/2020 11:34     Assessment/Plan Principal Problem:   Acute CHF (congestive heart failure) (HCC) Active Problems:   Hypertensive urgency   Nicotine dependence   Elevated troponin     Acute CHF Unclear etiology and probably diastolic Patient had significantly elevated blood pressure when she arrived in the ER Optimize blood pressure control Place patient on Lasix 40 mg IV daily Place patient on  Nitropaste and beta-blocker.  Hold off on ACE inhibitor/ARB for now due to renal insufficiency Obtain 2D echocardiogram to assess LVEF. We will consult cardiology    Hypertensive urgency   Optimize blood pressure control Patient has been started on Toprol and will uptitrate medications to optimize blood pressure control Continue Nitropaste    Nicotine dependence Smoking cessation was discussed with patient in detail She declines a nicotine transdermal patch at this time    Elevated troponin Most likely secondary to demand ischemia from hypertensive urgency Continue aspirin Follow-up results of 2D echocardiogram to rule out regional wall motion abnormality   DVT prophylaxis: Lovenox  Code Status: full code  Family Communication: Greater than 50% of time was spent discussing patient's condition and plan of care with her at the bedside.   All questions and concerns have been addressed.  She verbalizes understanding and agrees with the plan. Disposition Plan: Back to previous home environment Consults called: Cardiology Status: At the time of admission, it appears that the appropriate admission status for this patient is inpatient.   This is judged to be reasonable and necessary in order to provide the required intensity of service to ensure the patient's safety given the presenting symptoms, physical exam findings, and initial radiographic and laboratory data in the context of their comorbid conditions. Patient requires inpatient status due to high intensity of service, high risk for further deterioration and high frequency of surveillance required.    Lucile Shutters MD [facility]     11/25/2020, 4:31 PM

## 2020-11-26 ENCOUNTER — Inpatient Hospital Stay (HOSPITAL_COMMUNITY)
Admit: 2020-11-26 | Discharge: 2020-11-26 | Disposition: A | Discharge status: Home / Self Care | Payer: Medicare PPO | Attending: Internal Medicine | Admitting: Internal Medicine

## 2020-11-26 DIAGNOSIS — I5031 Acute diastolic (congestive) heart failure: Secondary | ICD-10-CM

## 2020-11-26 DIAGNOSIS — I340 Nonrheumatic mitral (valve) insufficiency: Secondary | ICD-10-CM | POA: Insufficient documentation

## 2020-11-26 DIAGNOSIS — I160 Hypertensive urgency: Secondary | ICD-10-CM

## 2020-11-26 DIAGNOSIS — I5021 Acute systolic (congestive) heart failure: Secondary | ICD-10-CM

## 2020-11-26 LAB — ECHOCARDIOGRAM COMPLETE
AR max vel: 2.38 cm2
AV Peak grad: 4.5 mmHg
Ao pk vel: 1.06 m/s
Area-P 1/2: 3.26 cm2
MV M vel: 4.98 m/s
MV Peak grad: 99.2 mmHg
P 1/2 time: 400 msec
Radius: 1 cm
S' Lateral: 5.18 cm
Single Plane A4C EF: 27.2 %
Weight: 3446.4 oz

## 2020-11-26 LAB — BASIC METABOLIC PANEL
Anion gap: 9 (ref 5–15)
BUN: 29 mg/dL — ABNORMAL HIGH (ref 8–23)
CO2: 21 mmol/L — ABNORMAL LOW (ref 22–32)
Calcium: 8.8 mg/dL — ABNORMAL LOW (ref 8.9–10.3)
Chloride: 108 mmol/L (ref 98–111)
Creatinine, Ser: 1.36 mg/dL — ABNORMAL HIGH (ref 0.44–1.00)
GFR, Estimated: 42 mL/min — ABNORMAL LOW (ref >60)
Glucose, Bld: 133 mg/dL — ABNORMAL HIGH (ref 70–99)
Potassium: 3.7 mmol/L (ref 3.5–5.1)
Sodium: 138 mmol/L (ref 135–145)

## 2020-11-26 LAB — HIV ANTIBODY (ROUTINE TESTING W REFLEX): HIV Screen 4th Generation wRfx: NONREACTIVE

## 2020-11-26 MED ORDER — MAGNESIUM HYDROXIDE 400 MG/5ML PO SUSP
15.0000 mL | Freq: Every day | ORAL | Status: DC | PRN
  Administered 2020-11-26 – 2020-12-08 (×2): 15 mL via ORAL
  Filled 2020-11-26 (×2): qty 30

## 2020-11-26 MED ORDER — MELATONIN 5 MG PO TABS
5.0000 mg | ORAL_TABLET | Freq: Every day | ORAL | Status: DC
  Administered 2020-11-26 – 2020-12-08 (×13): 5 mg via ORAL
  Filled 2020-11-26 (×13): qty 1

## 2020-11-26 MED ORDER — FUROSEMIDE 10 MG/ML IJ SOLN
40.0000 mg | Freq: Two times a day (BID) | INTRAMUSCULAR | Status: DC
  Administered 2020-11-26: 40 mg via INTRAVENOUS
  Filled 2020-11-26: qty 4

## 2020-11-26 MED ORDER — ISOSORB DINITRATE-HYDRALAZINE 20-37.5 MG PO TABS
1.0000 | ORAL_TABLET | Freq: Three times a day (TID) | ORAL | Status: DC
  Administered 2020-11-26 (×2): 1 via ORAL
  Filled 2020-11-26 (×5): qty 1

## 2020-11-26 MED ORDER — NYSTATIN 100000 UNIT/GM EX CREA
TOPICAL_CREAM | Freq: Two times a day (BID) | CUTANEOUS | Status: DC
  Filled 2020-11-26: qty 15

## 2020-11-26 MED ORDER — PERFLUTREN LIPID MICROSPHERE
1.0000 mL | INTRAVENOUS | Status: AC | PRN
  Administered 2020-11-26: 5 mL via INTRAVENOUS
  Filled 2020-11-26: qty 10

## 2020-11-26 MED ORDER — NYSTATIN 100000 UNIT/GM EX CREA
TOPICAL_CREAM | Freq: Two times a day (BID) | CUTANEOUS | Status: DC
  Administered 2020-12-05: 1 via TOPICAL
  Filled 2020-11-26 (×2): qty 15

## 2020-11-26 NOTE — ED Notes (Signed)
Pt resting in bed with eyes closed at this time.  Respirations even and unlabored.

## 2020-11-26 NOTE — ED Notes (Signed)
Patients linen changed and purewick repositioned. Patient given warm blanket and resting comfortably at this time. Call light in reach. Fall precautions in place.

## 2020-11-26 NOTE — Progress Notes (Signed)
Progress Note  Patient Name: Evelyn Dunn Date of Encounter: 11/26/2020  [facility] Cardiologist: Dr. Okey Dupre  Subjective   Still with some shortness of breath although improved.  She has had echocardiogram earlier today.  Denies chest pain.  Denies edema.  Inpatient Medications    Scheduled Meds:  enoxaparin (LOVENOX) injection  40 mg Subcutaneous Q24H   furosemide  40 mg Intravenous Q12H   isosorbide-hydrALAZINE  1 tablet Oral TID   metoprolol succinate  50 mg Oral Daily   sodium chloride flush  3 mL Intravenous Q12H   Continuous Infusions:  sodium chloride     PRN Meds: sodium chloride, acetaminophen, ondansetron (ZOFRAN) IV, sodium chloride flush   Vital Signs    Vitals:   11/26/20 0942 11/26/20 1000 11/26/20 1030 11/26/20 1100  BP: (!) 136/94 (!) 128/94 123/80 116/77  Pulse: 91 93 62 89  Resp:    18  Temp:      TempSrc:      SpO2:  99% 95% 95%  Weight:       No intake or output data in the 24 hours ending 11/26/20 1118 Last 3 Weights 11/25/2020  Weight (lbs) 215 lb 6.4 oz  Weight (kg) 97.705 kg      Telemetry    Sinus rhythm- Personally Reviewed  ECG    No new tracing obtained- Personally Reviewed  Physical Exam   GEN: Mild respiratory distress Neck: No JVD Cardiac: Distant heart sounds, regular, no murmurs Respiratory: Diminished breath sounds at bases GI: Soft, nontender, distended  MS: No edema; No deformity. Neuro:  Nonfocal  Psych: Normal affect   Labs    High Sensitivity Troponin:   Recent Labs  Lab 11/25/20 1053 11/25/20 1327  TROPONINIHS 149* 134*      Chemistry Recent Labs  Lab 11/25/20 1053 11/26/20 0500  NA 137 138  K 3.6 3.7  CL 104 108  CO2 21* 21*  GLUCOSE 134* 133*  BUN 28* 29*  CREATININE 1.30* 1.36*  CALCIUM 9.2 8.8*  PROT 7.5  --   ALBUMIN 4.0  --   AST 28  --   ALT 8  --   ALKPHOS 45  --   BILITOT 1.6*  --   GFRNONAA 45* 42*  ANIONGAP 12 9     Hematology Recent Labs  Lab 11/25/20 1053  WBC  4.5  RBC 4.45  HGB 14.4  HCT 41.9  MCV 94.2  MCH 32.4  MCHC 34.4  RDW 13.6  PLT 169    BNP Recent Labs  Lab 11/25/20 1053  BNP 1,370.8*     DDimer No results for input(s): DDIMER in the last 168 hours.   Radiology    CT Angio Chest PE W and/or Wo Contrast  Result Date: 11/25/2020 CLINICAL DATA:  Shortness of breath.  PE suspected. EXAM: CT ANGIOGRAPHY CHEST WITH CONTRAST TECHNIQUE: Multidetector CT imaging of the chest was performed using the standard protocol during bolus administration of intravenous contrast. Multiplanar CT image reconstructions and MIPs were obtained to evaluate the vascular anatomy. CONTRAST:  24mL OMNIPAQUE IOHEXOL 350 MG/ML SOLN COMPARISON:  None. FINDINGS: Cardiovascular: Heart is enlarged. No pericardial effusion. Coronary artery calcification is evident. Atherosclerotic calcification is noted in the wall of the thoracic aorta. Enlargement of the pulmonary outflow tract and main pulmonary arteries suggests pulmonary arterial hypertension. There is no large central pulmonary embolus in the main pulmonary outflow tract or either main pulmonary artery. No evidence for lobar pulmonary embolic disease. Segmental and subsegmental branches  to both lung show no definite pulmonary embolus although assessment is markedly degraded by breathing motion and may be unreliable. Mediastinum/Nodes: Scattered small mediastinal lymph nodes are evident. 14 mm short axis subcarinal lymph node is mildly enlarged. 11 mm short axis lymph node identified in the right hilum. The esophagus has normal imaging features. There is no axillary lymphadenopathy. Lungs/Pleura: Fine parenchymal detail the lungs is obscured by breathing motion. Patchy ground-glass attenuation in the dependent lower lungs bilaterally is probably atelectasis although infectious/inflammatory airspace disease cannot be excluded. Small bilateral pleural effusions noted, right greater than left. Upper Abdomen: Reflux of  injected contrast into the IVC and hepatic veins suggests right heart dysfunction. 8 mm hypervascular lesion in the medial segment left liver on 75/4 cannot be further characterized. 1.7 cm low-density lesion posteriorly in the lateral segment left liver cannot be definitively characterized but is likely benign. Layering tiny gallstones evident. Musculoskeletal: No worrisome lytic or sclerotic osseous abnormality. Review of the MIP images confirms the above findings. IMPRESSION: 1. Limited study due to breathing motion artifact. No large central pulmonary embolus evident. Segmental and subsegmental branches to both lung show no definite pulmonary embolus although assessment is markedly degraded by breathing motion and may be unreliable. 2. Enlargement of the pulmonary outflow tract and main pulmonary arteries suggests pulmonary arterial hypertension. 3. Reflux of intravenous contrast into the IVC and hepatic veins suggests right heart dysfunction. 4. Small bilateral pleural effusions, right greater than left. 5. Patchy ground-glass attenuation in the dependent lower lungs bilaterally is probably atelectasis although infectious/inflammatory airspace disease cannot be excluded. 6. Mild mediastinal and right hilar lymphadenopathy, potentially reactive. 7. 8 mm hypervascular lesion in the medial segment left liver is associated with a low-density lesion in the posterior aspect of the lateral segment left liver. Follow-up non emergent outpatient MRI without and with contrast after the patient recovers from this acute episode is recommended. 8. Cholelithiasis. 9. Aortic Atherosclerosis (ICD10-I70.0). Electronically Signed   By: Kennith Center M.D.   On: 11/25/2020 13:01   DG Chest Portable 1 View  Result Date: 11/25/2020 CLINICAL DATA:  Shortness of breath. EXAM: PORTABLE CHEST 1 VIEW COMPARISON:  None FINDINGS: Image rotated to the RIGHT. Cardiomediastinal contours accounting for this may be normal though there is  fullness of the RIGHT hilum which is asymmetric. Added density projecting over the RIGHT mainstem bronchus. EKG leads project over the chest. No lobar consolidation. No sign of pleural effusion on frontal radiograph. No acute skeletal process on limited assessment. IMPRESSION: 1. Asymmetric fullness of the RIGHT hilum and density projecting over the RIGHT mainstem bronchus. Fullness of hilum could be due to rotation or hilar adenopathy. 2. Question of material in the RIGHT mainstem bronchus. While some of this could be due to artifact due to rotation would suggest consideration for PA and lateral chest when the patient is able or CT as warranted. Electronically Signed   By: Donzetta Kohut M.D.   On: 11/25/2020 11:34   ECHOCARDIOGRAM COMPLETE  Result Date: 11/26/2020    ECHOCARDIOGRAM REPORT   Patient Name:   VINCETTA PUNTER Cork Date of Exam: 11/26/2020 Medical Rec #:  [id number]      Height: Accession #:    [id number]     Weight:       215.4 lb Date of Birth:  06-22-1950      BSA:          2.122 m Patient Age:    69 years       BP:  110/83 mmHg Patient Gender: F              HR:           93 bpm. Exam Location:  [facility] Procedure: 2D Echo Indications:     CHF I50.31  History:         Patient has no prior history of Echocardiogram examinations.  Sonographer:     Overton Mam Referring Phys:  [id number] AGBATA Diagnosing Phys: Debbe Odea MD  Sonographer Comments: Technically difficult study due to poor echo windows. Image acquisition challenging due to respiratory motion. IMPRESSIONS  1. Left ventricular ejection fraction, by estimation, is 20 to 25%. The left ventricle has severely decreased function. The left ventricle demonstrates global hypokinesis. The left ventricular internal cavity size was moderately dilated. There is mild left ventricular hypertrophy. Left ventricular diastolic parameters are indeterminate.  2. Right ventricular systolic function is moderately reduced. The right  ventricular size is normal.  3. Left atrial size was severely dilated.  4. Right atrial size was severely dilated.  5. The mitral valve is normal in structure. Moderate to severe mitral valve regurgitation.  6. The aortic valve was not well visualized. Aortic valve regurgitation is mild. FINDINGS  Left Ventricle: Left ventricular ejection fraction, by estimation, is 20 to 25%. The left ventricle has severely decreased function. The left ventricle demonstrates global hypokinesis. The left ventricular internal cavity size was moderately dilated. There is mild left ventricular hypertrophy. Left ventricular diastolic parameters are indeterminate. Right Ventricle: The right ventricular size is normal. No increase in right ventricular wall thickness. Right ventricular systolic function is moderately reduced. Left Atrium: Left atrial size was severely dilated. Right Atrium: Right atrial size was severely dilated. Pericardium: There is no evidence of pericardial effusion. Mitral Valve: The mitral valve is normal in structure. Moderate to severe mitral valve regurgitation. Tricuspid Valve: The tricuspid valve is normal in structure. Tricuspid valve regurgitation is mild. Aortic Valve: The aortic valve was not well visualized. Aortic valve regurgitation is mild. Aortic regurgitation PHT measures 400 msec. Aortic valve peak gradient measures 4.5 mmHg. Pulmonic Valve: The pulmonic valve was not well visualized. Pulmonic valve regurgitation is not visualized. Aorta: The aortic root is normal in size and structure. Venous: The inferior vena cava was not well visualized. IAS/Shunts: No atrial level shunt detected by color flow Doppler.  LEFT VENTRICLE PLAX 2D LVIDd:         5.86 cm      Diastology LVIDs:         5.18 cm      LV e' medial:    6.96 cm/s LV PW:         1.14 cm      LV E/e' medial:  12.0 LV IVS:        1.25 cm      LV e' lateral:   8.16 cm/s LVOT diam:     2.30 cm      LV E/e' lateral: 10.2 LV SV:         38 LV SV  Index:   18 LVOT Area:     4.15 cm  LV Volumes (MOD) LV vol d, MOD A4C: 158.0 ml LV vol s, MOD A4C: 115.0 ml LV SV MOD A4C:     158.0 ml RIGHT VENTRICLE RV Basal diam:  3.97 cm TAPSE (M-mode): 1.3 cm LEFT ATRIUM             Index       RIGHT ATRIUM  Index LA diam:        4.20 cm 1.98 cm/m  RA Area:     19.30 cm LA Vol (A2C):   71.4 ml 33.65 ml/m RA Volume:   65.90 ml  31.06 ml/m LA Vol (A4C):   75.5 ml 35.59 ml/m LA Biplane Vol: 77.4 ml 36.48 ml/m  AORTIC VALVE AV Area (Vmax): 2.38 cm AV Vmax:        106.00 cm/s AV Peak Grad:   4.5 mmHg LVOT Vmax:      60.70 cm/s LVOT Vmean:     36.200 cm/s LVOT VTI:       0.092 m AI PHT:         400 msec  AORTA Ao Root diam: 3.50 cm MITRAL VALVE MV Area (PHT): 3.26 cm      SHUNTS MV Decel Time: 233 msec      Systemic VTI:  0.09 m MR Peak grad:    99.2 mmHg   Systemic Diam: 2.30 cm MR Mean grad:    57.0 mmHg MR Vmax:         498.00 cm/s MR Vmean:        356.0 cm/s MR PISA:         6.28 cm MR PISA Eff ROA: 40 mm MR PISA Radius:  1.00 cm MV E velocity: 83.60 cm/s MV A velocity: 23.10 cm/s MV E/A ratio:  3.62 Debbe Odea MD Electronically signed by Debbe Odea MD Signature Date/Time: 11/26/2020/11:18:15 AM    Final     Cardiac Studies   Echo 11/26/2020 1. Left ventricular ejection fraction, by estimation, is 20 to 25%. The  left ventricle has severely decreased function. The left ventricle  demonstrates global hypokinesis. The left ventricular internal cavity size  was moderately dilated. There is mild  left ventricular hypertrophy. Left ventricular diastolic parameters are  indeterminate.   2. Right ventricular systolic function is moderately reduced. The right  ventricular size is normal.   3. Left atrial size was severely dilated.   4. Right atrial size was severely dilated.   5. The mitral valve is normal in structure. Moderate to severe mitral  valve regurgitation.   6. The aortic valve was not well visualized. Aortic valve  regurgitation  is mild.  Patient Profile     70 y.o. female with history of hypertension, current smoker x40+ years presenting with worsening shortness of breath.  Found to have severely reduced ejection fraction, EF 20 to 25%.  Moderate to severe MR being seen for CHF.  Assessment & Plan   HFrEF, EF 20 to 25% -Increase Lasix to 40 mg twice daily -Continue Toprol-XL, start BiDil (avoiding ACE, ARB, Arni due to renal dysfunction).   -Tentatively plan for right and left heart cath on Monday if able to lay flat/respiratory status improves.  2.  Hypertension  -BP improved ,stop Nitropaste -Continue Toprol-XL, start BiDil.  3.  Moderate to severe MR -Lasix as above -Consider TEE for better eval, especially if left heart cath shows no significant CAD.  4.  Current smoker -Cessation advised  Total encounter time 35 minutes  Greater than 50% was spent in counseling and coordination of care with the patient     Signed, Debbe Odea, MD  11/26/2020, 11:18 AM

## 2020-11-26 NOTE — Progress Notes (Signed)
PROGRESS NOTE  Evelyn Dunn TDD:[id number] DOB: Feb 18, 1951 DOA: 11/25/2020 PCP: Pcp, No   LOS: 1 day   Brief Narrative / Interim history: 70 year old female with HTN, tobacco use, who comes to the hospital with shortness of breath.  This is been progressively getting worse over the last 4 days, worse with exertion and unable to lay flat.  Impression in the ED was acute on chronic diastolic CHF, she was placed on diuretics and was admitted to the hospital.  She was also significantly hypertensive.  Cardiology consulted as she was found to have left bundle branch block.  Subjective / 24h Interval events: She is feeling better this morning, less shortness of breath.  Has not been up to walk  Assessment & Plan: Principal Problem Acute CHF, likely diastolic-2D echo pending at this time.  BNP significantly elevated. -Seems to have responded to Lasix, continue today -Continue isosorbide hydralazine, metoprolol  Active Problems Hypertensive emergency-blood pressure improved significantly with Lasix, metoprolol.  Continue to monitor  Tobacco use-cessation has been encouraged  Troponin elevation-flat, downtrending, not in a pattern consistent with ACS.  Likely demand ischemia from severe hypertension  Creatinine elevation-creatinine is noted to be 1.3 on arrival.  Unknown past values so not clear whether she has chronic kidney disease stage IIIa versus acute kidney injury.  Creatinine is stable today with diuresis  Scheduled Meds:  enoxaparin (LOVENOX) injection  40 mg Subcutaneous Q24H   furosemide  40 mg Intravenous Q12H   isosorbide-hydrALAZINE  1 tablet Oral TID   metoprolol succinate  50 mg Oral Daily   sodium chloride flush  3 mL Intravenous Q12H   Continuous Infusions:  sodium chloride     PRN Meds:.sodium chloride, acetaminophen, ondansetron (ZOFRAN) IV, sodium chloride flush  Diet Orders (From admission, onward)     Start     Ordered   11/25/20 1450  Diet 2 gram sodium Room  service appropriate? Yes; Fluid consistency: Thin  Diet effective now       Question Answer Comment  Room service appropriate? Yes   Fluid consistency: Thin      11/25/20 1451            DVT prophylaxis: enoxaparin (LOVENOX) injection 40 mg Start: 11/25/20 1500     Code Status: Full Code  Family Communication: No family at bedside  Status is: Inpatient  Remains inpatient appropriate because:Inpatient level of care appropriate due to severity of illness  Dispo: The patient is from: Home              Anticipated d/c is to: Home              Patient currently is not medically stable to d/c.   Difficult to place patient No    Level of care: Progressive Cardiac  Consultants:  Cardiology  Procedures:  2D echo: Pending  Microbiology  none  Antimicrobials: none    Objective: Vitals:   11/26/20 0800 11/26/20 0830 11/26/20 0900 11/26/20 0942  BP: 128/88 111/72 119/77 (!) 136/94  Pulse: 63 70 91 91  Resp:      Temp:      TempSrc:      SpO2: 99% 92% 95%   Weight:       No intake or output data in the 24 hours ending 11/26/20 1023 Filed Weights   11/25/20 1050  Weight: 97.7 kg    Examination:  Constitutional: NAD Eyes: no scleral icterus ENMT: Mucous membranes are moist.  Neck: normal, supple Respiratory: clear to auscultation  bilaterally, no wheezing, no crackles. Normal respiratory effort.  Cardiovascular: Regular rate and rhythm, no murmurs / rubs / gallops.  No significant lower extremity edema Abdomen: non distended, no tenderness. Bowel sounds positive.  Musculoskeletal: no clubbing / cyanosis.  Skin: no rashes Neurologic: CN 2-12 grossly intact. Strength 5/5 in all 4.   Data Reviewed: I have independently reviewed following labs and imaging studies   CBC: Recent Labs  Lab 11/25/20 1053  WBC 4.5  NEUTROABS 2.4  HGB 14.4  HCT 41.9  MCV 94.2  PLT 169   Basic Metabolic Panel: Recent Labs  Lab 11/25/20 1053 11/26/20 0500  NA 137 138   K 3.6 3.7  CL 104 108  CO2 21* 21*  GLUCOSE 134* 133*  BUN 28* 29*  CREATININE 1.30* 1.36*  CALCIUM 9.2 8.8*  MG 2.2  --    Liver Function Tests: Recent Labs  Lab 11/25/20 1053  AST 28  ALT 8  ALKPHOS 45  BILITOT 1.6*  PROT 7.5  ALBUMIN 4.0   Coagulation Profile: Recent Labs  Lab 11/25/20 1053  INR 1.2   HbA1C: Recent Labs    11/25/20 1327  HGBA1C 6.4*   CBG: No results for input(s): GLUCAP in the last 168 hours.  Recent Results (from the past 240 hour(s))  Resp Panel by RT-PCR (Flu A&B, Covid) Nasopharyngeal Swab     Status: None   Collection Time: 11/25/20 11:18 AM   Specimen: Nasopharyngeal Swab; Nasopharyngeal(NP) swabs in vial transport medium  Result Value Ref Range Status   SARS Coronavirus 2 by RT PCR NEGATIVE NEGATIVE Final    Comment: (NOTE) SARS-CoV-2 target nucleic acids are NOT DETECTED.  The SARS-CoV-2 RNA is generally detectable in upper respiratory specimens during the acute phase of infection. The lowest concentration of SARS-CoV-2 viral copies this assay can detect is 138 copies/mL. A negative result does not preclude SARS-Cov-2 infection and should not be used as the sole basis for treatment or other patient management decisions. A negative result may occur with  improper specimen collection/handling, submission of specimen other than nasopharyngeal swab, presence of viral mutation(s) within the areas targeted by this assay, and inadequate number of viral copies(<138 copies/mL). A negative result must be combined with clinical observations, patient history, and epidemiological information. The expected result is Negative.  Fact Sheet for Patients:  BloggerCourse.com  Fact Sheet for Healthcare Providers:  SeriousBroker.it  This test is no t yet approved or cleared by the [place] FDA and  has been authorized for detection and/or diagnosis of SARS-CoV-2 by FDA under an Emergency  Use Authorization (EUA). This EUA will remain  in effect (meaning this test can be used) for the duration of the COVID-19 declaration under Section 564(b)(1) of the Act, 21 U.S.C.section 360bbb-3(b)(1), unless the authorization is terminated  or revoked sooner.       Influenza A by PCR NEGATIVE NEGATIVE Final   Influenza B by PCR NEGATIVE NEGATIVE Final    Comment: (NOTE) The Xpert Xpress SARS-CoV-2/FLU/RSV plus assay is intended as an aid in the diagnosis of influenza from Nasopharyngeal swab specimens and should not be used as a sole basis for treatment. Nasal washings and aspirates are unacceptable for Xpert Xpress SARS-CoV-2/FLU/RSV testing.  Fact Sheet for Patients: BloggerCourse.com  Fact Sheet for Healthcare Providers: SeriousBroker.it  This test is not yet approved or cleared by the [place] FDA and has been authorized for detection and/or diagnosis of SARS-CoV-2 by FDA under an Emergency Use Authorization (EUA). This EUA will remain  in effect (meaning this test can be used) for the duration of the COVID-19 declaration under Section 564(b)(1) of the Act, 21 U.S.C. section 360bbb-3(b)(1), unless the authorization is terminated or revoked.  Performed at [facility], [address] [place], [place] [address]      Radiology Studies: CT Angio Chest PE W and/or Wo Contrast  Result Date: 11/25/2020 CLINICAL DATA:  Shortness of breath.  PE suspected. EXAM: CT ANGIOGRAPHY CHEST WITH CONTRAST TECHNIQUE: Multidetector CT imaging of the chest was performed using the standard protocol during bolus administration of intravenous contrast. Multiplanar CT image reconstructions and MIPs were obtained to evaluate the vascular anatomy. CONTRAST:  5mL OMNIPAQUE IOHEXOL 350 MG/ML SOLN COMPARISON:  None. FINDINGS: Cardiovascular: Heart is enlarged. No pericardial effusion. Coronary artery calcification is evident.  Atherosclerotic calcification is noted in the wall of the thoracic aorta. Enlargement of the pulmonary outflow tract and main pulmonary arteries suggests pulmonary arterial hypertension. There is no large central pulmonary embolus in the main pulmonary outflow tract or either main pulmonary artery. No evidence for lobar pulmonary embolic disease. Segmental and subsegmental branches to both lung show no definite pulmonary embolus although assessment is markedly degraded by breathing motion and may be unreliable. Mediastinum/Nodes: Scattered small mediastinal lymph nodes are evident. 14 mm short axis subcarinal lymph node is mildly enlarged. 11 mm short axis lymph node identified in the right hilum. The esophagus has normal imaging features. There is no axillary lymphadenopathy. Lungs/Pleura: Fine parenchymal detail the lungs is obscured by breathing motion. Patchy ground-glass attenuation in the dependent lower lungs bilaterally is probably atelectasis although infectious/inflammatory airspace disease cannot be excluded. Small bilateral pleural effusions noted, right greater than left. Upper Abdomen: Reflux of injected contrast into the IVC and hepatic veins suggests right heart dysfunction. 8 mm hypervascular lesion in the medial segment left liver on 75/4 cannot be further characterized. 1.7 cm low-density lesion posteriorly in the lateral segment left liver cannot be definitively characterized but is likely benign. Layering tiny gallstones evident. Musculoskeletal: No worrisome lytic or sclerotic osseous abnormality. Review of the MIP images confirms the above findings. IMPRESSION: 1. Limited study due to breathing motion artifact. No large central pulmonary embolus evident. Segmental and subsegmental branches to both lung show no definite pulmonary embolus although assessment is markedly degraded by breathing motion and may be unreliable. 2. Enlargement of the pulmonary outflow tract and main pulmonary arteries  suggests pulmonary arterial hypertension. 3. Reflux of intravenous contrast into the IVC and hepatic veins suggests right heart dysfunction. 4. Small bilateral pleural effusions, right greater than left. 5. Patchy ground-glass attenuation in the dependent lower lungs bilaterally is probably atelectasis although infectious/inflammatory airspace disease cannot be excluded. 6. Mild mediastinal and right hilar lymphadenopathy, potentially reactive. 7. 8 mm hypervascular lesion in the medial segment left liver is associated with a low-density lesion in the posterior aspect of the lateral segment left liver. Follow-up non emergent outpatient MRI without and with contrast after the patient recovers from this acute episode is recommended. 8. Cholelithiasis. 9. Aortic Atherosclerosis (ICD10-I70.0). Electronically Signed   By: Kennith Center M.D.   On: 11/25/2020 13:01   DG Chest Portable 1 View  Result Date: 11/25/2020 CLINICAL DATA:  Shortness of breath. EXAM: PORTABLE CHEST 1 VIEW COMPARISON:  None FINDINGS: Image rotated to the RIGHT. Cardiomediastinal contours accounting for this may be normal though there is fullness of the RIGHT hilum which is asymmetric. Added density projecting over the RIGHT mainstem bronchus. EKG leads project over the chest. No  lobar consolidation. No sign of pleural effusion on frontal radiograph. No acute skeletal process on limited assessment. IMPRESSION: 1. Asymmetric fullness of the RIGHT hilum and density projecting over the RIGHT mainstem bronchus. Fullness of hilum could be due to rotation or hilar adenopathy. 2. Question of material in the RIGHT mainstem bronchus. While some of this could be due to artifact due to rotation would suggest consideration for PA and lateral chest when the patient is able or CT as warranted. Electronically Signed   By: Donzetta Kohut M.D.   On: 11/25/2020 11:34     Pamella Pert, MD, PhD [facility]  Between 7 am - 7 pm I am available, please  contact me via Amion (for emergencies) or Securechat (non urgent messages)  Between 7 pm - 7 am I am not available, please contact night coverage MD/APP via Amion

## 2020-11-26 NOTE — ED Notes (Signed)
Spoke with Cardiology MD on his rounds. D/C nitro paste, daily Lasix incr to 80mg /day, and Bidil added. NTG paste removed, awaiting Bidil from pharmacy.

## 2020-11-26 NOTE — Progress Notes (Signed)
*  PRELIMINARY RESULTS* Echocardiogram 2D Echocardiogram has been performed. Definity IV Contrast used on this study.  Lenor Coffin 11/26/2020, 10:21 AM

## 2020-11-27 LAB — CBC
HCT: 38.9 % (ref 36.0–46.0)
Hemoglobin: 13.3 g/dL (ref 12.0–15.0)
MCH: 32.8 pg (ref 26.0–34.0)
MCHC: 34.2 g/dL (ref 30.0–36.0)
MCV: 96 fL (ref 80.0–100.0)
Platelets: 154 10*3/uL (ref 150–400)
RBC: 4.05 MIL/uL (ref 3.87–5.11)
RDW: 13.6 % (ref 11.5–15.5)
WBC: 6.1 10*3/uL (ref 4.0–10.5)
nRBC: 0 % (ref 0.0–0.2)

## 2020-11-27 LAB — BASIC METABOLIC PANEL
Anion gap: 7 (ref 5–15)
BUN: 34 mg/dL — ABNORMAL HIGH (ref 8–23)
CO2: 24 mmol/L (ref 22–32)
Calcium: 8.9 mg/dL (ref 8.9–10.3)
Chloride: 109 mmol/L (ref 98–111)
Creatinine, Ser: 1.53 mg/dL — ABNORMAL HIGH (ref 0.44–1.00)
GFR, Estimated: 37 mL/min — ABNORMAL LOW (ref >60)
Glucose, Bld: 137 mg/dL — ABNORMAL HIGH (ref 70–99)
Potassium: 3.3 mmol/L — ABNORMAL LOW (ref 3.5–5.1)
Sodium: 140 mmol/L (ref 135–145)

## 2020-11-27 MED ORDER — SENNOSIDES-DOCUSATE SODIUM 8.6-50 MG PO TABS
2.0000 | ORAL_TABLET | Freq: Every day | ORAL | Status: DC
  Administered 2020-11-27 – 2020-12-08 (×11): 2 via ORAL
  Filled 2020-11-27 (×12): qty 2

## 2020-11-27 MED ORDER — MILK AND MOLASSES ENEMA
1.0000 | Freq: Once | RECTAL | Status: DC
  Filled 2020-11-27: qty 240

## 2020-11-27 MED ORDER — METOPROLOL SUCCINATE ER 25 MG PO TB24
25.0000 mg | ORAL_TABLET | Freq: Every day | ORAL | Status: DC
  Administered 2020-11-27 – 2020-12-09 (×13): 25 mg via ORAL
  Filled 2020-11-27 (×13): qty 1

## 2020-11-27 MED ORDER — POTASSIUM CHLORIDE CRYS ER 20 MEQ PO TBCR
40.0000 meq | EXTENDED_RELEASE_TABLET | Freq: Once | ORAL | Status: AC
  Administered 2020-11-27: 40 meq via ORAL
  Filled 2020-11-27: qty 2

## 2020-11-27 MED ORDER — MAGNESIUM CITRATE PO SOLN
1.0000 | Freq: Once | ORAL | Status: AC
  Administered 2020-11-27: 1 via ORAL
  Filled 2020-11-27: qty 296

## 2020-11-27 MED ORDER — FUROSEMIDE 10 MG/ML IJ SOLN
40.0000 mg | Freq: Every day | INTRAMUSCULAR | Status: DC
  Administered 2020-11-27: 40 mg via INTRAVENOUS
  Filled 2020-11-27: qty 4

## 2020-11-27 MED ORDER — HYDROXYZINE HCL 10 MG PO TABS
10.0000 mg | ORAL_TABLET | Freq: Three times a day (TID) | ORAL | Status: DC | PRN
  Administered 2020-11-27 – 2020-12-08 (×12): 10 mg via ORAL
  Filled 2020-11-27 (×13): qty 1

## 2020-11-27 NOTE — Progress Notes (Addendum)
Progress Note  Patient Name: Evelyn Dunn Date of Encounter: 11/27/2020  [facility] Cardiologist: New  Subjective   UOP - and weights trending down. She is off O2 and feeling breathing is much better. She is able to lay flat on my exam.   Inpatient Medications    Scheduled Meds:  enoxaparin (LOVENOX) injection  40 mg Subcutaneous Q24H   furosemide  40 mg Intravenous Q12H   isosorbide-hydrALAZINE  1 tablet Oral TID   melatonin  5 mg Oral QHS   metoprolol succinate  50 mg Oral Daily   nystatin cream   Topical BID   potassium chloride  40 mEq Oral Once   sodium chloride flush  3 mL Intravenous Q12H   Continuous Infusions:  sodium chloride     PRN Meds: sodium chloride, acetaminophen, magnesium hydroxide, ondansetron (ZOFRAN) IV, sodium chloride flush   Vital Signs    Vitals:   11/26/20 1559 11/26/20 1921 11/27/20 0443 11/27/20 0815  BP: 110/71 (!) 120/96 112/80 99/69  Pulse: 75 98 87 68  Resp: 18 19 18 18   Temp: 97.9 F (36.6 C) 98.3 F (36.8 C) 98.1 F (36.7 C) 98.1 F (36.7 C)  TempSrc:  Oral    SpO2: 90% 98% 96% 94%  Weight:   95.7 kg   Height:        Intake/Output Summary (Last 24 hours) at 11/27/2020 0818 Last data filed at 11/27/2020 0700 Gross per 24 hour  Intake 250 ml  Output 701 ml  Net -451 ml   Last 3 Weights 11/27/2020 11/25/2020  Weight (lbs) 211 lb 215 lb 6.4 oz  Weight (kg) 95.709 kg 97.705 kg      Telemetry    NSR, HR 80-90, PVCs - Personally Reviewed  ECG    No new - Personally Reviewed  Physical Exam   GEN: No acute distress.   Neck: + JVD Cardiac: RRR, no murmurs, rubs, or gallops.  Respiratory: Clear to auscultation bilaterally. GI: Soft, nontender, non-distended  MS: No edema; No deformity. Neuro:  Nonfocal  Psych: Normal affect   Labs    High Sensitivity Troponin:   Recent Labs  Lab 11/25/20 1053 11/25/20 1327  TROPONINIHS 149* 134*      Chemistry Recent Labs  Lab 11/25/20 1053 11/26/20 0500  11/27/20 0501  NA 137 138 140  K 3.6 3.7 3.3*  CL 104 108 109  CO2 21* 21* 24  GLUCOSE 134* 133* 137*  BUN 28* 29* 34*  CREATININE 1.30* 1.36* 1.53*  CALCIUM 9.2 8.8* 8.9  PROT 7.5  --   --   ALBUMIN 4.0  --   --   AST 28  --   --   ALT 8  --   --   ALKPHOS 45  --   --   BILITOT 1.6*  --   --   GFRNONAA 45* 42* 37*  ANIONGAP 12 9 7      Hematology Recent Labs  Lab 11/25/20 1053 11/27/20 0501  WBC 4.5 6.1  RBC 4.45 4.05  HGB 14.4 13.3  HCT 41.9 38.9  MCV 94.2 96.0  MCH 32.4 32.8  MCHC 34.4 34.2  RDW 13.6 13.6  PLT 169 154    BNP Recent Labs  Lab 11/25/20 1053  BNP 1,370.8*     DDimer No results for input(s): DDIMER in the last 168 hours.   Radiology    CT Angio Chest PE W and/or Wo Contrast  Result Date: 11/25/2020 CLINICAL DATA:  Shortness of breath.  PE suspected. EXAM: CT ANGIOGRAPHY CHEST WITH CONTRAST TECHNIQUE: Multidetector CT imaging of the chest was performed using the standard protocol during bolus administration of intravenous contrast. Multiplanar CT image reconstructions and MIPs were obtained to evaluate the vascular anatomy. CONTRAST:  66mL OMNIPAQUE IOHEXOL 350 MG/ML SOLN COMPARISON:  None. FINDINGS: Cardiovascular: Heart is enlarged. No pericardial effusion. Coronary artery calcification is evident. Atherosclerotic calcification is noted in the wall of the thoracic aorta. Enlargement of the pulmonary outflow tract and main pulmonary arteries suggests pulmonary arterial hypertension. There is no large central pulmonary embolus in the main pulmonary outflow tract or either main pulmonary artery. No evidence for lobar pulmonary embolic disease. Segmental and subsegmental branches to both lung show no definite pulmonary embolus although assessment is markedly degraded by breathing motion and may be unreliable. Mediastinum/Nodes: Scattered small mediastinal lymph nodes are evident. 14 mm short axis subcarinal lymph node is mildly enlarged. 11 mm short axis  lymph node identified in the right hilum. The esophagus has normal imaging features. There is no axillary lymphadenopathy. Lungs/Pleura: Fine parenchymal detail the lungs is obscured by breathing motion. Patchy ground-glass attenuation in the dependent lower lungs bilaterally is probably atelectasis although infectious/inflammatory airspace disease cannot be excluded. Small bilateral pleural effusions noted, right greater than left. Upper Abdomen: Reflux of injected contrast into the IVC and hepatic veins suggests right heart dysfunction. 8 mm hypervascular lesion in the medial segment left liver on 75/4 cannot be further characterized. 1.7 cm low-density lesion posteriorly in the lateral segment left liver cannot be definitively characterized but is likely benign. Layering tiny gallstones evident. Musculoskeletal: No worrisome lytic or sclerotic osseous abnormality. Review of the MIP images confirms the above findings. IMPRESSION: 1. Limited study due to breathing motion artifact. No large central pulmonary embolus evident. Segmental and subsegmental branches to both lung show no definite pulmonary embolus although assessment is markedly degraded by breathing motion and may be unreliable. 2. Enlargement of the pulmonary outflow tract and main pulmonary arteries suggests pulmonary arterial hypertension. 3. Reflux of intravenous contrast into the IVC and hepatic veins suggests right heart dysfunction. 4. Small bilateral pleural effusions, right greater than left. 5. Patchy ground-glass attenuation in the dependent lower lungs bilaterally is probably atelectasis although infectious/inflammatory airspace disease cannot be excluded. 6. Mild mediastinal and right hilar lymphadenopathy, potentially reactive. 7. 8 mm hypervascular lesion in the medial segment left liver is associated with a low-density lesion in the posterior aspect of the lateral segment left liver. Follow-up non emergent outpatient MRI without and with  contrast after the patient recovers from this acute episode is recommended. 8. Cholelithiasis. 9. Aortic Atherosclerosis (ICD10-I70.0). Electronically Signed   By: Kennith Center M.D.   On: 11/25/2020 13:01   DG Chest Portable 1 View  Result Date: 11/25/2020 CLINICAL DATA:  Shortness of breath. EXAM: PORTABLE CHEST 1 VIEW COMPARISON:  None FINDINGS: Image rotated to the RIGHT. Cardiomediastinal contours accounting for this may be normal though there is fullness of the RIGHT hilum which is asymmetric. Added density projecting over the RIGHT mainstem bronchus. EKG leads project over the chest. No lobar consolidation. No sign of pleural effusion on frontal radiograph. No acute skeletal process on limited assessment. IMPRESSION: 1. Asymmetric fullness of the RIGHT hilum and density projecting over the RIGHT mainstem bronchus. Fullness of hilum could be due to rotation or hilar adenopathy. 2. Question of material in the RIGHT mainstem bronchus. While some of this could be due to artifact due to rotation would suggest consideration for PA and  lateral chest when the patient is able or CT as warranted. Electronically Signed   By: Donzetta Kohut M.D.   On: 11/25/2020 11:34   ECHOCARDIOGRAM COMPLETE  Result Date: 11/26/2020    ECHOCARDIOGRAM REPORT   Patient Name:   Evelyn Dunn Notaro Date of Exam: 11/26/2020 Medical Rec #:  [id number]      Height: Accession #:    [id number]     Weight:       215.4 lb Date of Birth:  May 11, 1951      BSA:          2.122 m Patient Age:    69 years       BP:           110/83 mmHg Patient Gender: F              HR:           93 bpm. Exam Location:  [facility] Procedure: 2D Echo Indications:     CHF I50.31  History:         Patient has no prior history of Echocardiogram examinations.  Sonographer:     Overton Mam Referring Phys:  [id number] MEQASTMH AGBATA Diagnosing Phys: Debbe Odea MD  Sonographer Comments: Technically difficult study due to poor echo windows. Image acquisition challenging  due to respiratory motion. IMPRESSIONS  1. Left ventricular ejection fraction, by estimation, is 20 to 25%. The left ventricle has severely decreased function. The left ventricle demonstrates global hypokinesis. The left ventricular internal cavity size was moderately dilated. There is mild left ventricular hypertrophy. Left ventricular diastolic parameters are indeterminate.  2. Right ventricular systolic function is moderately reduced. The right ventricular size is normal.  3. Left atrial size was severely dilated.  4. Right atrial size was severely dilated.  5. The mitral valve is normal in structure. Moderate to severe mitral valve regurgitation.  6. The aortic valve was not well visualized. Aortic valve regurgitation is mild. FINDINGS  Left Ventricle: Left ventricular ejection fraction, by estimation, is 20 to 25%. The left ventricle has severely decreased function. The left ventricle demonstrates global hypokinesis. The left ventricular internal cavity size was moderately dilated. There is mild left ventricular hypertrophy. Left ventricular diastolic parameters are indeterminate. Right Ventricle: The right ventricular size is normal. No increase in right ventricular wall thickness. Right ventricular systolic function is moderately reduced. Left Atrium: Left atrial size was severely dilated. Right Atrium: Right atrial size was severely dilated. Pericardium: There is no evidence of pericardial effusion. Mitral Valve: The mitral valve is normal in structure. Moderate to severe mitral valve regurgitation. Tricuspid Valve: The tricuspid valve is normal in structure. Tricuspid valve regurgitation is mild. Aortic Valve: The aortic valve was not well visualized. Aortic valve regurgitation is mild. Aortic regurgitation PHT measures 400 msec. Aortic valve peak gradient measures 4.5 mmHg. Pulmonic Valve: The pulmonic valve was not well visualized. Pulmonic valve regurgitation is not visualized. Aorta: The aortic root is  normal in size and structure. Venous: The inferior vena cava was not well visualized. IAS/Shunts: No atrial level shunt detected by color flow Doppler.  LEFT VENTRICLE PLAX 2D LVIDd:         5.86 cm      Diastology LVIDs:         5.18 cm      LV e' medial:    6.96 cm/s LV PW:         1.14 cm      LV E/e' medial:  12.0 LV IVS:  1.25 cm      LV e' lateral:   8.16 cm/s LVOT diam:     2.30 cm      LV E/e' lateral: 10.2 LV SV:         38 LV SV Index:   18 LVOT Area:     4.15 cm  LV Volumes (MOD) LV vol d, MOD A4C: 158.0 ml LV vol s, MOD A4C: 115.0 ml LV SV MOD A4C:     158.0 ml RIGHT VENTRICLE RV Basal diam:  3.97 cm TAPSE (M-mode): 1.3 cm LEFT ATRIUM             Index       RIGHT ATRIUM           Index LA diam:        4.20 cm 1.98 cm/m  RA Area:     19.30 cm LA Vol (A2C):   71.4 ml 33.65 ml/m RA Volume:   65.90 ml  31.06 ml/m LA Vol (A4C):   75.5 ml 35.59 ml/m LA Biplane Vol: 77.4 ml 36.48 ml/m  AORTIC VALVE AV Area (Vmax): 2.38 cm AV Vmax:        106.00 cm/s AV Peak Grad:   4.5 mmHg LVOT Vmax:      60.70 cm/s LVOT Vmean:     36.200 cm/s LVOT VTI:       0.092 m AI PHT:         400 msec  AORTA Ao Root diam: 3.50 cm MITRAL VALVE MV Area (PHT): 3.26 cm      SHUNTS MV Decel Time: 233 msec      Systemic VTI:  0.09 m MR Peak grad:    99.2 mmHg   Systemic Diam: 2.30 cm MR Mean grad:    57.0 mmHg MR Vmax:         498.00 cm/s MR Vmean:        356.0 cm/s MR PISA:         6.28 cm MR PISA Eff ROA: 40 mm MR PISA Radius:  1.00 cm MV E velocity: 83.60 cm/s MV A velocity: 23.10 cm/s MV E/A ratio:  3.62 Debbe Odea MD Electronically signed by Debbe Odea MD Signature Date/Time: 11/26/2020/11:18:15 AM    Final     Cardiac Studies   Echo 11/26/2020 1. Left ventricular ejection fraction, by estimation, is 20 to 25%. The  left ventricle has severely decreased function. The left ventricle  demonstrates global hypokinesis. The left ventricular internal cavity size  was moderately dilated. There is mild   left ventricular hypertrophy. Left ventricular diastolic parameters are  indeterminate.   2. Right ventricular systolic function is moderately reduced. The right  ventricular size is normal.   3. Left atrial size was severely dilated.   4. Right atrial size was severely dilated.   5. The mitral valve is normal in structure. Moderate to severe mitral  valve regurgitation.   6. The aortic valve was not well visualized. Aortic valve regurgitation  is mild.  Patient Profile     70 y.o. female  with history of hypertension, current smoker x40+ years presenting with worsening shortness of breath.  Found to have severely reduced ejection fraction, EF 20 to 25%.  Moderate to severe MR being seen for CHF.  Assessment & Plan    HFrEF New EF 20-25% - BNP up to 1300 on presentation - Echo this admission showed EF 20-25%, global HK, mild LVH, moderately reduced EF, severely dialted biatrium, mod to severe  MR - IV lasix 40mg  daily - Toprol-XL 25mg  dialy - Bidil stopped for ?hypotension - No ACE/ARB/ARNI due to renal dysfunction - I/Os -700mL overnight, weight down 215lbs>211 - replete potassium - kidney function up today, possible mildly dehydrated - Plan for R/L heart cath once able to lay flat. Can likely schedule for tomorrow, however kidney function mildly worse today, may need to switch to oral diuretics.  Risks and benefits of cardiac catheterization have been discussed with the patient.  These include bleeding, infection, kidney damage, stroke, heart attack, death.  The patient understands these risks and is willing to proceed.   HTN - BP 182/144 on arrival - nitro past discontinued 6/11 - started on Toprol-XL - IV lasix - BPS improved  Mod to severe MR - can consider TEE in the future  Current smoker - cessation advised  LBBB - originally called as CODE STEMI, but no prior EKG for comparison. Denied chest pain, presented with SOB - HS troponin peak 149, BNP significantly  elevated, more supportive of acute CHF - Echo with low EF as above, she will need cardiac cath as above  Kidney insufficiency - on admission Cr 1.36, BUN 29 - today Scr/BUN 1.53/34 - unclear baseline   For questions or updates, please contact [facility] Please consult [email or web address] for contact info under        Signed, Jaliel Deavers David StallH Juanmanuel Marohl, PA-C  11/27/2020, 8:18 AM

## 2020-11-27 NOTE — Progress Notes (Signed)
PROGRESS NOTE  Evelyn Dunn OZH:[id number] DOB: [birth year] DOA: 11/25/2020 PCP: Pcp, No   LOS: 2 days   Brief Narrative / Interim history: 70 year old female with HTN, tobacco use, who comes to the hospital with shortness of breath.  This is been progressively getting worse over the last 4 days, worse with exertion and unable to lay flat.  Impression in the ED was acute on chronic diastolic CHF, she was placed on diuretics and was admitted to the hospital.  She was also significantly hypertensive.  Cardiology consulted as she was found to have left bundle branch block.  Subjective / 24h Interval events: Breathing has improved, she is now able to lay flat.  Denies any chest pain.  Assessment & Plan: Principal Problem Acute systolic CHF-patient was admitted to the hospital with fluid overload in the setting of acute systolic CHF.  A 2D echo done on 6/11 showed EF 20-25% with severely decreased LV function.  RV function was moderately reduced also.  Cardiology consulted and following -Receiving IV Lasix however creatinine slightly up today, can probably hold further diuresis but defer to cardiology -Plan for cardiac cath hopefully tomorrow -Continue metoprolol  Active Problems Hypertensive emergency-blood pressure now normalized with Lasix, now on metoprolol  Tobacco use-cessation has been encouraged  Troponin elevation-flat, downtrending, not in a pattern consistent with ACS.  Likely demand ischemia from severe hypertension  Creatinine elevation-creatinine is noted to be 1.3 on arrival.  Unknown past values so not clear whether she has chronic kidney disease stage IIIa versus acute kidney injury.  Creatinine 1.5 this morning  Scheduled Meds:  enoxaparin (LOVENOX) injection  40 mg Subcutaneous Q24H   furosemide  40 mg Intravenous Daily   melatonin  5 mg Oral QHS   metoprolol succinate  25 mg Oral Daily   nystatin cream   Topical BID   senna-docusate  2 tablet Oral QHS   sodium  chloride flush  3 mL Intravenous Q12H   Continuous Infusions:  sodium chloride     PRN Meds:.sodium chloride, acetaminophen, hydrOXYzine, magnesium hydroxide, ondansetron (ZOFRAN) IV, sodium chloride flush  Diet Orders (From admission, onward)     Start     Ordered   11/25/20 1450  Diet 2 gram sodium Room service appropriate? Yes; Fluid consistency: Thin  Diet effective now       Question Answer Comment  Room service appropriate? Yes   Fluid consistency: Thin      11/25/20 1451            DVT prophylaxis: enoxaparin (LOVENOX) injection 40 mg Start: 11/25/20 1500     Code Status: Full Code  Family Communication: No family at bedside  Status is: Inpatient  Remains inpatient appropriate because:Inpatient level of care appropriate due to severity of illness  Dispo: The patient is from: Home              Anticipated d/c is to: Home              Patient currently is not medically stable to d/c.   Difficult to place patient No    Level of care: Progressive Cardiac  Consultants:  Cardiology  Procedures:  2D echo  1. Left ventricular ejection fraction, by estimation, is 20 to 25%. The left ventricle has severely decreased function. The left ventricle demonstrates global hypokinesis. The left ventricular internal cavity size was moderately dilated. There is mild left ventricular hypertrophy. Left ventricular diastolic parameters are indeterminate.   2. Right ventricular systolic function is moderately reduced.  The right ventricular size is normal.   3. Left atrial size was severely dilated.   4. Right atrial size was severely dilated.   5. The mitral valve is normal in structure. Moderate to severe mitral valve regurgitation.   6. The aortic valve was not well visualized. Aortic valve regurgitation is mild.   Microbiology  none  Antimicrobials: none    Objective: Vitals:   11/26/20 1559 11/26/20 1921 11/27/20 0443 11/27/20 0815  BP: 110/71 (!) 120/96 112/80  99/69  Pulse: 75 98 87 68  Resp: 18 19 18 18   Temp: 97.9 F (36.6 C) 98.3 F (36.8 C) 98.1 F (36.7 C) 98.1 F (36.7 C)  TempSrc:  Oral    SpO2: 90% 98% 96% 94%  Weight:   95.7 kg   Height:        Intake/Output Summary (Last 24 hours) at 11/27/2020 1115 Last data filed at 11/27/2020 0700 Gross per 24 hour  Intake 250 ml  Output 701 ml  Net -451 ml   Filed Weights   11/25/20 1050 11/27/20 0443  Weight: 97.7 kg 95.7 kg    Examination:  Constitutional: She is in no distress Eyes: no scleral icterus ENMT: mmm Neck: normal, supple Respiratory: clear bilaterally, no wheezing, no crackles, normal respiratory effort Cardiovascular: Regular rate and rhythm, no murmurs, no edema Abdomen: soft, NT, ND, bowel sounds positive Musculoskeletal: no clubbing / cyanosis.  Skin: No rashes seen Neurologic: No focal deficits  Data Reviewed: I have independently reviewed following labs and imaging studies   CBC: Recent Labs  Lab 11/25/20 1053 11/27/20 0501  WBC 4.5 6.1  NEUTROABS 2.4  --   HGB 14.4 13.3  HCT 41.9 38.9  MCV 94.2 96.0  PLT 169 154    Basic Metabolic Panel: Recent Labs  Lab 11/25/20 1053 11/26/20 0500 11/27/20 0501  NA 137 138 140  K 3.6 3.7 3.3*  CL 104 108 109  CO2 21* 21* 24  GLUCOSE 134* 133* 137*  BUN 28* 29* 34*  CREATININE 1.30* 1.36* 1.53*  CALCIUM 9.2 8.8* 8.9  MG 2.2  --   --     Liver Function Tests: Recent Labs  Lab 11/25/20 1053  AST 28  ALT 8  ALKPHOS 45  BILITOT 1.6*  PROT 7.5  ALBUMIN 4.0    Coagulation Profile: Recent Labs  Lab 11/25/20 1053  INR 1.2    HbA1C: Recent Labs    11/25/20 1327  HGBA1C 6.4*    CBG: No results for input(s): GLUCAP in the last 168 hours.  Recent Results (from the past 240 hour(s))  Resp Panel by RT-PCR (Flu A&B, Covid) Nasopharyngeal Swab     Status: None   Collection Time: 11/25/20 11:18 AM   Specimen: Nasopharyngeal Swab; Nasopharyngeal(NP) swabs in vial transport medium  Result  Value Ref Range Status   SARS Coronavirus 2 by RT PCR NEGATIVE NEGATIVE Final    Comment: (NOTE) SARS-CoV-2 target nucleic acids are NOT DETECTED.  The SARS-CoV-2 RNA is generally detectable in upper respiratory specimens during the acute phase of infection. The lowest concentration of SARS-CoV-2 viral copies this assay can detect is 138 copies/mL. A negative result does not preclude SARS-Cov-2 infection and should not be used as the sole basis for treatment or other patient management decisions. A negative result may occur with  improper specimen collection/handling, submission of specimen other than nasopharyngeal swab, presence of viral mutation(s) within the areas targeted by this assay, and inadequate number of viral copies(<138 copies/mL). A negative  result must be combined with clinical observations, patient history, and epidemiological information. The expected result is Negative.  Fact Sheet for Patients:  BloggerCourse.com  Fact Sheet for Healthcare Providers:  SeriousBroker.it  This test is no t yet approved or cleared by the [place] FDA and  has been authorized for detection and/or diagnosis of SARS-CoV-2 by FDA under an Emergency Use Authorization (EUA). This EUA will remain  in effect (meaning this test can be used) for the duration of the COVID-19 declaration under Section 564(b)(1) of the Act, 21 U.S.C.section 360bbb-3(b)(1), unless the authorization is terminated  or revoked sooner.       Influenza A by PCR NEGATIVE NEGATIVE Final   Influenza B by PCR NEGATIVE NEGATIVE Final    Comment: (NOTE) The Xpert Xpress SARS-CoV-2/FLU/RSV plus assay is intended as an aid in the diagnosis of influenza from Nasopharyngeal swab specimens and should not be used as a sole basis for treatment. Nasal washings and aspirates are unacceptable for Xpert Xpress SARS-CoV-2/FLU/RSV testing.  Fact Sheet for  Patients: BloggerCourse.com  Fact Sheet for Healthcare Providers: SeriousBroker.it  This test is not yet approved or cleared by the [place] FDA and has been authorized for detection and/or diagnosis of SARS-CoV-2 by FDA under an Emergency Use Authorization (EUA). This EUA will remain in effect (meaning this test can be used) for the duration of the COVID-19 declaration under Section 564(b)(1) of the Act, 21 U.S.C. section 360bbb-3(b)(1), unless the authorization is terminated or revoked.  Performed at [facility], [address] [place], [place] [address]       Radiology Studies: No results found.   Pamella Pert, MD, PhD [facility]  Between 7 am - 7 pm I am available, please contact me via Amion (for emergencies) or Securechat (non urgent messages)  Between 7 pm - 7 am I am not available, please contact night coverage MD/APP via Amion

## 2020-11-28 LAB — BASIC METABOLIC PANEL
Anion gap: 15 (ref 5–15)
BUN: 45 mg/dL — ABNORMAL HIGH (ref 8–23)
CO2: 17 mmol/L — ABNORMAL LOW (ref 22–32)
Calcium: 9.2 mg/dL (ref 8.9–10.3)
Chloride: 105 mmol/L (ref 98–111)
Creatinine, Ser: 2.51 mg/dL — ABNORMAL HIGH (ref 0.44–1.00)
GFR, Estimated: 20 mL/min — ABNORMAL LOW (ref >60)
Glucose, Bld: 105 mg/dL — ABNORMAL HIGH (ref 70–99)
Potassium: 4.7 mmol/L (ref 3.5–5.1)
Sodium: 137 mmol/L (ref 135–145)

## 2020-11-28 LAB — CBC
HCT: 44.3 % (ref 36.0–46.0)
Hemoglobin: 14.3 g/dL (ref 12.0–15.0)
MCH: 32.7 pg (ref 26.0–34.0)
MCHC: 32.3 g/dL (ref 30.0–36.0)
MCV: 101.4 fL — ABNORMAL HIGH (ref 80.0–100.0)
Platelets: 147 10*3/uL — ABNORMAL LOW (ref 150–400)
RBC: 4.37 MIL/uL (ref 3.87–5.11)
RDW: 13.8 % (ref 11.5–15.5)
WBC: 7.5 10*3/uL (ref 4.0–10.5)
nRBC: 0 % (ref 0.0–0.2)

## 2020-11-28 MED ORDER — SODIUM CHLORIDE 0.9 % IV SOLN: INTRAVENOUS | Status: AC

## 2020-11-28 MED ORDER — MORPHINE SULFATE (PF) 2 MG/ML IV SOLN
1.0000 mg | Freq: Once | INTRAVENOUS | Status: AC
  Administered 2020-11-28: 1 mg via INTRAVENOUS
  Filled 2020-11-28: qty 1

## 2020-11-28 MED ORDER — ENOXAPARIN SODIUM 30 MG/0.3ML IJ SOSY
30.0000 mg | PREFILLED_SYRINGE | INTRAMUSCULAR | Status: DC
  Administered 2020-11-29 – 2020-12-01 (×3): 30 mg via SUBCUTANEOUS
  Filled 2020-11-28 (×3): qty 0.3

## 2020-11-28 NOTE — Progress Notes (Signed)
Progress Note  Patient Name: Evelyn Dunn Date of Encounter: 11/28/2020  Primary Cardiologist: New to [facility] - consult by End  Subjective   Dyspnea much improved and back to baseline. No chest pain, lower extremity swelling, abdominal distension or worsening orthopnea. At baseline, she reports stable 3-pillow orthopnea. Documented UOP 1.5 L for the pasts 24 hours with a net negative 1.9 L for the admission. BUN/SCr continue to trend upwards from an admission value of 28/1.30--> 45/2.51 currently. IV Lasix was decreased to 40 mg yesterday, and has been held this morning.   Inpatient Medications    Scheduled Meds:  enoxaparin (LOVENOX) injection  40 mg Subcutaneous Q24H   melatonin  5 mg Oral QHS   metoprolol succinate  25 mg Oral Daily   milk and molasses  1 enema Rectal Once   nystatin cream   Topical BID   senna-docusate  2 tablet Oral QHS   sodium chloride flush  3 mL Intravenous Q12H   Continuous Infusions:  sodium chloride     PRN Meds: sodium chloride, acetaminophen, hydrOXYzine, magnesium hydroxide, ondansetron (ZOFRAN) IV, sodium chloride flush   Vital Signs    Vitals:   11/28/20 0038 11/28/20 0500 11/28/20 0829 11/28/20 0923  BP: 100/62  131/84   Pulse: 65  83   Resp: 18  18   Temp: 98.2 F (36.8 C)   97.8 F (36.6 C)  TempSrc: Oral     SpO2: 96%  95%   Weight:  92.6 kg    Height:        Intake/Output Summary (Last 24 hours) at 11/28/2020 0927 Last data filed at 11/28/2020 0926 Gross per 24 hour  Intake 50 ml  Output 1500 ml  Net -1450 ml   Filed Weights   11/25/20 1050 11/27/20 0443 11/28/20 0500  Weight: 97.7 kg 95.7 kg 92.6 kg    Telemetry    SR - Personally Reviewed  ECG    No new tracings - Personally Reviewed  Physical Exam   GEN: No acute distress.   Neck: No JVD. Cardiac: RRR, II/VI systolic murmur at the apex, no rubs, or gallops.  Respiratory: Clear to auscultation bilaterally.  GI: Soft, nontender, non-distended.   MS: No  edema; No deformity. Neuro:  Alert and oriented x 3; Nonfocal.  Psych: Normal affect.  Labs    Chemistry Recent Labs  Lab 11/25/20 1053 11/26/20 0500 11/27/20 0501 11/28/20 0447  NA 137 138 140 137  K 3.6 3.7 3.3* 4.7  CL 104 108 109 105  CO2 21* 21* 24 17*  GLUCOSE 134* 133* 137* 105*  BUN 28* 29* 34* 45*  CREATININE 1.30* 1.36* 1.53* 2.51*  CALCIUM 9.2 8.8* 8.9 9.2  PROT 7.5  --   --   --   ALBUMIN 4.0  --   --   --   AST 28  --   --   --   ALT 8  --   --   --   ALKPHOS 45  --   --   --   BILITOT 1.6*  --   --   --   GFRNONAA 45* 42* 37* 20*  ANIONGAP 12 9 7 15      Hematology Recent Labs  Lab 11/25/20 1053 11/27/20 0501 11/28/20 0447  WBC 4.5 6.1 7.5  RBC 4.45 4.05 4.37  HGB 14.4 13.3 14.3  HCT 41.9 38.9 44.3  MCV 94.2 96.0 101.4*  MCH 32.4 32.8 32.7  MCHC 34.4 34.2 32.3  RDW 13.6 13.6 13.8  PLT 169 154 147*    Cardiac EnzymesNo results for input(s): TROPONINI in the last 168 hours. No results for input(s): TROPIPOC in the last 168 hours.   BNP Recent Labs  Lab 11/25/20 1053  BNP 1,370.8*     DDimer No results for input(s): DDIMER in the last 168 hours.   Radiology    No results found.  Cardiac Studies   2D echo 11/26/2020: 1. Left ventricular ejection fraction, by estimation, is 20 to 25%. The  left ventricle has severely decreased function. The left ventricle  demonstrates global hypokinesis. The left ventricular internal cavity size  was moderately dilated. There is mild  left ventricular hypertrophy. Left ventricular diastolic parameters are  indeterminate.   2. Right ventricular systolic function is moderately reduced. The right  ventricular size is normal.   3. Left atrial size was severely dilated.   4. Right atrial size was severely dilated.   5. The mitral valve is normal in structure. Moderate to severe mitral  valve regurgitation.   6. The aortic valve was not well visualized. Aortic valve regurgitation  is mild.   Patient  Profile     70 y.o. female with history of HTN and tobacco use who has not seen a physician as an outpatient in several years admitted with sudden onset of dyspnea and hypertensive emergency who we are seeing for acute HFrEF, moderate to severe mitral regurgitation, and LBBB with worsening renal function noted with IV diuresis.   Assessment & Plan    Acute HFrEF: -Dyspnea much improved and now back to baseline -IV Lasix on hold with up trending renal function, give 500 mL normal saline today -Less likely cardiorenal syndrome with noted improvement in symptoms -Will need a R/LHC this admission once renal function is improved -Toprol XL -Relative hypotension in the 90s to low 100s systolic over the past 24  hours precludes escalation of GDMT, advance as able -CHF education discussed in detail -Daily weights -Strict I/O  Elevated high sensitivity troponin: -Mildly elevated and down trending, not consistent with ACS -Echo as above -R/LHC prior to discharge as above  Moderate to severe mitral regurgitation: -Will need a TEE following improvement in clinical picture, possibly as an outpatient  AKI: -Less likely cardiorenal given significant improvement in dyspnea, now back to baseline -Will given 500 mL normal saline today as reassess renal function  Hypertensive emergency: -Blood pressure improved -Continue current medical therapy above  LBBB: -No syncope -Ischemic evaluation as above  Tobacco use: -Cessation recommended    For questions or updates, please contact [facility] Please consult [email or web address] for contact info under Cardiology/STEMI.    Signed, Eula Listen, PA-C [facility] Pager: [phone] 11/28/2020, 9:27 AM

## 2020-11-28 NOTE — Consult Note (Addendum)
   Heart Failure Nurse Navigator Note  HFrEF 20-25% mild LVH.  Normal right ventricular systolic function severe biatrial enlargement.  Moderate to severe mitral regurgitation.  Mild aortic insufficiency.  She presented to the emergency room with complaints of shortness of breath and orthopnea.    Comorbidities:  Hypertension Continued tobacco abuse  Labs:  Sodium 137, potassium 4.0, Chloride 105, CO2 17, BUN 45, creatinine 2.5 on admission 1,3.BNP on admission was 1370.  Weight 92.6 kg yesterday 95.7 Intake not documented Output 1500 mL.  Medications:  Metoprolol succinate 25 mg daily Furosemide on hold   Initial meeting with patient today, her cousin was also present.  Patient states that she currently lives with her brother.  She states that neither of them cook.  She says that they obtained her food from restaurants like  the country grilled or a steak house.  She states that she eats 1 meal a day.  Adding salt at the table.  Also discussed what to order when she orders from a restaurant, used the examples and living with heart failure booklet.  Feels that she drinks approximately 2 L of liquid daily.  She states that she does not have a scale at home.  Discussed the importance of weighing daily and recording.  We will contact TOC for scale.  She states that she wants to read through the heart failure booklet. told her that I would return tomorrow and we could discuss further.  Tresa Endo RN [facility]

## 2020-11-28 NOTE — Progress Notes (Signed)
PROGRESS NOTE  Evelyn Dunn SAY:[id number] DOB: September 16, 1950 DOA: 11/25/2020 PCP: Pcp, No   LOS: 3 days   Brief Narrative / Interim history: 70 year old female with HTN, tobacco use, who comes to the hospital with shortness of breath.  This is been progressively getting worse over the last 4 days, worse with exertion and unable to lay flat.  Impression in the ED was acute on chronic diastolic CHF, she was placed on diuretics and was admitted to the hospital.  She was also significantly hypertensive.  Cardiology consulted as she was found to have left bundle branch block.  Subjective / 24h Interval events: No chest pain, no shortness of breath.  Overall feels well, feels like the room is a little bit hot overnight  Assessment & Plan: Principal Problem Acute systolic CHF-patient was admitted to the hospital with fluid overload in the setting of acute systolic CHF.  A 2D echo done on 6/11 showed EF 20-25% with severely decreased LV function.  RV function was moderately reduced also.  Cardiology consulted and following -Creatinine significantly up today after IV Lasix.  Hold further Lasix -Probably hold cath due to elevation in creatinine -Continue metoprolol  Active Problems Hypertensive emergency-blood pressure now normalized with Lasix, now on metoprolol  Tobacco use-cessation has been encouraged  Troponin elevation-flat, downtrending, not in a pattern consistent with ACS.  Likely demand ischemia from severe hypertension  Acute kidney injury on chronic kidney disease stage IIIa -creatinine is noted to be 1.3 on arrival, likely her baseline.  After receiving Lasix jumped to 2.5 today.  Hold further Lasix..  She appears dry, will discuss with cardiology regarding fluids   Scheduled Meds:  enoxaparin (LOVENOX) injection  40 mg Subcutaneous Q24H   melatonin  5 mg Oral QHS   metoprolol succinate  25 mg Oral Daily   milk and molasses  1 enema Rectal Once   nystatin cream   Topical BID    senna-docusate  2 tablet Oral QHS   sodium chloride flush  3 mL Intravenous Q12H   Continuous Infusions:  sodium chloride     PRN Meds:.sodium chloride, acetaminophen, hydrOXYzine, magnesium hydroxide, ondansetron (ZOFRAN) IV, sodium chloride flush  Diet Orders (From admission, onward)     Start     Ordered   11/25/20 1450  Diet 2 gram sodium Room service appropriate? Yes; Fluid consistency: Thin  Diet effective now       Question Answer Comment  Room service appropriate? Yes   Fluid consistency: Thin      11/25/20 1451            DVT prophylaxis: enoxaparin (LOVENOX) injection 40 mg Start: 11/25/20 1500     Code Status: Full Code  Family Communication: No family at bedside  Status is: Inpatient  Remains inpatient appropriate because:Inpatient level of care appropriate due to severity of illness  Dispo: The patient is from: Home              Anticipated d/c is to: Home              Patient currently is not medically stable to d/c.   Difficult to place patient No    Level of care: Progressive Cardiac  Consultants:  Cardiology  Procedures:  2D echo  1. Left ventricular ejection fraction, by estimation, is 20 to 25%. The left ventricle has severely decreased function. The left ventricle demonstrates global hypokinesis. The left ventricular internal cavity size was moderately dilated. There is mild left ventricular hypertrophy. Left ventricular  diastolic parameters are indeterminate.   2. Right ventricular systolic function is moderately reduced. The right ventricular size is normal.   3. Left atrial size was severely dilated.   4. Right atrial size was severely dilated.   5. The mitral valve is normal in structure. Moderate to severe mitral valve regurgitation.   6. The aortic valve was not well visualized. Aortic valve regurgitation is mild.   Microbiology  none  Antimicrobials: none    Objective: Vitals:   11/28/20 0038 11/28/20 0500 11/28/20 0829  11/28/20 0923  BP: 100/62  131/84   Pulse: 65  83   Resp: 18  18   Temp: 98.2 F (36.8 C)   97.8 F (36.6 C)  TempSrc: Oral     SpO2: 96%  95%   Weight:  92.6 kg    Height:        Intake/Output Summary (Last 24 hours) at 11/28/2020 0959 Last data filed at 11/28/2020 6269 Gross per 24 hour  Intake 50 ml  Output 1500 ml  Net -1450 ml    Filed Weights   11/25/20 1050 11/27/20 0443 11/28/20 0500  Weight: 97.7 kg 95.7 kg 92.6 kg    Examination:  Constitutional: NAD Eyes: Anicteric ENMT: Moist mucous membranes Neck: normal, supple Respiratory: Clear bilaterally without wheezing or crackles Cardiovascular: Regular rate and rhythm, no edema Abdomen: Soft, NT, ND, bowel sounds positive Musculoskeletal: no clubbing / cyanosis.  Skin: No rashes Neurologic: Nonfocal  Data Reviewed: I have independently reviewed following labs and imaging studies   CBC: Recent Labs  Lab 11/25/20 1053 11/27/20 0501 11/28/20 0447  WBC 4.5 6.1 7.5  NEUTROABS 2.4  --   --   HGB 14.4 13.3 14.3  HCT 41.9 38.9 44.3  MCV 94.2 96.0 101.4*  PLT 169 154 147*    Basic Metabolic Panel: Recent Labs  Lab 11/25/20 1053 11/26/20 0500 11/27/20 0501 11/28/20 0447  NA 137 138 140 137  K 3.6 3.7 3.3* 4.7  CL 104 108 109 105  CO2 21* 21* 24 17*  GLUCOSE 134* 133* 137* 105*  BUN 28* 29* 34* 45*  CREATININE 1.30* 1.36* 1.53* 2.51*  CALCIUM 9.2 8.8* 8.9 9.2  MG 2.2  --   --   --     Liver Function Tests: Recent Labs  Lab 11/25/20 1053  AST 28  ALT 8  ALKPHOS 45  BILITOT 1.6*  PROT 7.5  ALBUMIN 4.0    Coagulation Profile: Recent Labs  Lab 11/25/20 1053  INR 1.2    HbA1C: Recent Labs    11/25/20 1327  HGBA1C 6.4*    CBG: No results for input(s): GLUCAP in the last 168 hours.  Recent Results (from the past 240 hour(s))  Resp Panel by RT-PCR (Flu A&B, Covid) Nasopharyngeal Swab     Status: None   Collection Time: 11/25/20 11:18 AM   Specimen: Nasopharyngeal Swab;  Nasopharyngeal(NP) swabs in vial transport medium  Result Value Ref Range Status   SARS Coronavirus 2 by RT PCR NEGATIVE NEGATIVE Final    Comment: (NOTE) SARS-CoV-2 target nucleic acids are NOT DETECTED.  The SARS-CoV-2 RNA is generally detectable in upper respiratory specimens during the acute phase of infection. The lowest concentration of SARS-CoV-2 viral copies this assay can detect is 138 copies/mL. A negative result does not preclude SARS-Cov-2 infection and should not be used as the sole basis for treatment or other patient management decisions. A negative result may occur with  improper specimen collection/handling, submission of specimen other  than nasopharyngeal swab, presence of viral mutation(s) within the areas targeted by this assay, and inadequate number of viral copies(<138 copies/mL). A negative result must be combined with clinical observations, patient history, and epidemiological information. The expected result is Negative.  Fact Sheet for Patients:  BloggerCourse.com  Fact Sheet for Healthcare Providers:  SeriousBroker.it  This test is no t yet approved or cleared by the [place] FDA and  has been authorized for detection and/or diagnosis of SARS-CoV-2 by FDA under an Emergency Use Authorization (EUA). This EUA will remain  in effect (meaning this test can be used) for the duration of the COVID-19 declaration under Section 564(b)(1) of the Act, 21 U.S.C.section 360bbb-3(b)(1), unless the authorization is terminated  or revoked sooner.       Influenza A by PCR NEGATIVE NEGATIVE Final   Influenza B by PCR NEGATIVE NEGATIVE Final    Comment: (NOTE) The Xpert Xpress SARS-CoV-2/FLU/RSV plus assay is intended as an aid in the diagnosis of influenza from Nasopharyngeal swab specimens and should not be used as a sole basis for treatment. Nasal washings and aspirates are unacceptable for Xpert Xpress  SARS-CoV-2/FLU/RSV testing.  Fact Sheet for Patients: BloggerCourse.com  Fact Sheet for Healthcare Providers: SeriousBroker.it  This test is not yet approved or cleared by the [place] FDA and has been authorized for detection and/or diagnosis of SARS-CoV-2 by FDA under an Emergency Use Authorization (EUA). This EUA will remain in effect (meaning this test can be used) for the duration of the COVID-19 declaration under Section 564(b)(1) of the Act, 21 U.S.C. section 360bbb-3(b)(1), unless the authorization is terminated or revoked.  Performed at [facility], [address], [place], [place] [address]       Radiology Studies: No results found.   Pamella Pert, MD, PhD [facility]  Between 7 am - 7 pm I am available, please contact me via Amion (for emergencies) or Securechat (non urgent messages)  Between 7 pm - 7 am I am not available, please contact night coverage MD/APP via Amion

## 2020-11-28 NOTE — Progress Notes (Signed)
IVT consulted for difficult stick.  Upon arrival, RN informed me she was successful at placing a PIV.  Consult cleared.

## 2020-11-28 NOTE — Progress Notes (Signed)
CSW consulted for heart failure home health, Heart Failure RN has completed heart failure education.   CSW provided scale at bedside.   TOC continues to follow for discharge planning needs.   [place], [place] [phone]

## 2020-11-28 NOTE — Care Management Important Message (Signed)
Important Message  Patient Details  Name: Evelyn Dunn MRN: [id number] Date of Birth: 1951/02/09   Medicare Important Message Given:  N/A - LOS <3 / Initial given by admissions  Initial Medicare IM reviewed with Keane Scrape, sister, by Dub Amis, Patient Access Associate on 11/27/2020 at 8:38am.   Johnell Comings 11/28/2020, 9:26 AM

## 2020-11-28 NOTE — Progress Notes (Signed)
11/28/20 0950  Clinical Encounter Type  Visited With Family  Visit Type Social support  Referral From  (Rounding)  Consult/Referral To Chaplain  Recommendations Follow-up  Spiritual Encounters  Spiritual Needs  (Not assessed)  Daryel November met briefly with PT's cousin, Janace Hoard, in hallway. She recommended that the chaplain follow-up later on; Chaplain Deloria Lair affirmed availability of pastoral support for PT and family.

## 2020-11-29 DIAGNOSIS — R0602 Shortness of breath: Secondary | ICD-10-CM

## 2020-11-29 DIAGNOSIS — I420 Dilated cardiomyopathy: Secondary | ICD-10-CM

## 2020-11-29 DIAGNOSIS — J90 Pleural effusion, not elsewhere classified: Secondary | ICD-10-CM

## 2020-11-29 DIAGNOSIS — R000 Tachycardia, unspecified: Secondary | ICD-10-CM

## 2020-11-29 DIAGNOSIS — R778 Other specified abnormalities of plasma proteins: Secondary | ICD-10-CM

## 2020-11-29 LAB — BASIC METABOLIC PANEL
Anion gap: 14 (ref 5–15)
BUN: 53 mg/dL — ABNORMAL HIGH (ref 8–23)
CO2: 20 mmol/L — ABNORMAL LOW (ref 22–32)
Calcium: 8.9 mg/dL (ref 8.9–10.3)
Chloride: 102 mmol/L (ref 98–111)
Creatinine, Ser: 2.75 mg/dL — ABNORMAL HIGH (ref 0.44–1.00)
GFR, Estimated: 18 mL/min — ABNORMAL LOW (ref >60)
Glucose, Bld: 115 mg/dL — ABNORMAL HIGH (ref 70–99)
Potassium: 4 mmol/L (ref 3.5–5.1)
Sodium: 136 mmol/L (ref 135–145)

## 2020-11-29 LAB — CBC
HCT: 42.3 % (ref 36.0–46.0)
Hemoglobin: 14.1 g/dL (ref 12.0–15.0)
MCH: 32.4 pg (ref 26.0–34.0)
MCHC: 33.3 g/dL (ref 30.0–36.0)
MCV: 97.2 fL (ref 80.0–100.0)
Platelets: 148 10*3/uL — ABNORMAL LOW (ref 150–400)
RBC: 4.35 MIL/uL (ref 3.87–5.11)
RDW: 13.6 % (ref 11.5–15.5)
WBC: 6.5 10*3/uL (ref 4.0–10.5)
nRBC: 0 % (ref 0.0–0.2)

## 2020-11-29 MED ORDER — ISOSORB DINITRATE-HYDRALAZINE 20-37.5 MG PO TABS
0.5000 | ORAL_TABLET | Freq: Three times a day (TID) | ORAL | Status: DC
  Administered 2020-11-29 – 2020-12-04 (×18): 0.5 via ORAL
  Filled 2020-11-29 (×19): qty 0.5

## 2020-11-29 NOTE — Progress Notes (Signed)
Progress Note  Patient Name: Evelyn Dunn Date of Encounter: 11/29/2020  Primary Cardiologist: New to [facility] - consult by End  Subjective   Mildly SOB this morning following a bath. Otherwise, she continues to feel like her dyspnea remains improved. No chest pain. Renal function trended to 2.75 today from 2.5 yesterday. Vitals stable.   Inpatient Medications    Scheduled Meds:  enoxaparin (LOVENOX) injection  30 mg Subcutaneous Q24H   melatonin  5 mg Oral QHS   metoprolol succinate  25 mg Oral Daily   milk and molasses  1 enema Rectal Once   nystatin cream   Topical BID   senna-docusate  2 tablet Oral QHS   sodium chloride flush  3 mL Intravenous Q12H   Continuous Infusions:  sodium chloride     PRN Meds: sodium chloride, acetaminophen, hydrOXYzine, magnesium hydroxide, ondansetron (ZOFRAN) IV, sodium chloride flush   Vital Signs    Vitals:   11/28/20 1958 11/29/20 0320 11/29/20 0715 11/29/20 1120  BP: 97/75 129/73 111/71 130/80  Pulse: 82 69 61 66  Resp: 17 18 18    Temp: 98.3 F (36.8 C) 98.6 F (37 C) 97.7 F (36.5 C)   TempSrc:   Oral   SpO2: 100% 96% 99% 95%  Weight:  92 kg    Height:        Intake/Output Summary (Last 24 hours) at 11/29/2020 1132 Last data filed at 11/29/2020 0950 Gross per 24 hour  Intake 885.36 ml  Output --  Net 885.36 ml   Filed Weights   11/27/20 0443 11/28/20 0500 11/29/20 0320  Weight: 95.7 kg 92.6 kg 92 kg    Telemetry    SR with occasional PVCs - Personally Reviewed  ECG    No new tracings - Personally Reviewed  Physical Exam   GEN: No acute distress.   Neck: No JVD. Cardiac: RRR, II/VI systolic murmur at the apex, no rubs, or gallops.  Respiratory: Clear to auscultation bilaterally.  GI: Soft, nontender, non-distended.   MS: No edema; No deformity. Neuro:  Alert and oriented x 3; Nonfocal.  Psych: Normal affect.  Labs    Chemistry Recent Labs  Lab 11/25/20 1053 11/26/20 0500 11/27/20 0501  11/28/20 0447 11/29/20 0543  NA 137   < > 140 137 136  K 3.6   < > 3.3* 4.7 4.0  CL 104   < > 109 105 102  CO2 21*   < > 24 17* 20*  GLUCOSE 134*   < > 137* 105* 115*  BUN 28*   < > 34* 45* 53*  CREATININE 1.30*   < > 1.53* 2.51* 2.75*  CALCIUM 9.2   < > 8.9 9.2 8.9  PROT 7.5  --   --   --   --   ALBUMIN 4.0  --   --   --   --   AST 28  --   --   --   --   ALT 8  --   --   --   --   ALKPHOS 45  --   --   --   --   BILITOT 1.6*  --   --   --   --   GFRNONAA 45*   < > 37* 20* 18*  ANIONGAP 12   < > 7 15 14    < > = values in this interval not displayed.     Hematology Recent Labs  Lab 11/27/20 0501 11/28/20 0447 11/29/20  0543  WBC 6.1 7.5 6.5  RBC 4.05 4.37 4.35  HGB 13.3 14.3 14.1  HCT 38.9 44.3 42.3  MCV 96.0 101.4* 97.2  MCH 32.8 32.7 32.4  MCHC 34.2 32.3 33.3  RDW 13.6 13.8 13.6  PLT 154 147* 148*    Cardiac EnzymesNo results for input(s): TROPONINI in the last 168 hours. No results for input(s): TROPIPOC in the last 168 hours.   BNP Recent Labs  Lab 11/25/20 1053  BNP 1,370.8*     DDimer No results for input(s): DDIMER in the last 168 hours.   Radiology    No results found.  Cardiac Studies   2D echo 11/26/2020: 1. Left ventricular ejection fraction, by estimation, is 20 to 25%. The  left ventricle has severely decreased function. The left ventricle  demonstrates global hypokinesis. The left ventricular internal cavity size  was moderately dilated. There is mild  left ventricular hypertrophy. Left ventricular diastolic parameters are  indeterminate.   2. Right ventricular systolic function is moderately reduced. The right  ventricular size is normal.   3. Left atrial size was severely dilated.   4. Right atrial size was severely dilated.   5. The mitral valve is normal in structure. Moderate to severe mitral  valve regurgitation.   6. The aortic valve was not well visualized. Aortic valve regurgitation  is mild.  Patient Profile     70 y.o.  female with history of HTN and tobacco use who has not seen a physician as an outpatient in several years admitted with sudden onset of dyspnea and hypertensive emergency who we are seeing for acute HFrEF, moderate to severe mitral regurgitation, and LBBB with worsening renal function noted with IV diuresis.   Assessment & Plan    Acute HFrEF: -Dyspnea much improved overall -She does not appear grossly volume up -Continue to hold IV Lasix with up trending renal function -Cannot exclude ATN with hypotension and some degree of over diuresis  -Will hold off on adding milrinone at this time and reassess in 24 hours -Will need a R/LHC this admission once renal function is improved -Toprol XL -Add half dose BiDil with close monitoring of BP -Relative hypotension has precludes escalation of GDMT, advance as able -CHF education discussed in detail -Daily weights -Strict I/O   Elevated high sensitivity troponin: -Mildly elevated and down trending, not consistent with ACS -Echo as above -R/LHC prior to discharge as above   Moderate to severe mitral regurgitation: -Will need a TEE following improvement in clinical picture, possibly as an outpatient   AKI: -Cannot exclude some degree of underlying renal dysfunction at baseline (no old labs) -Trend over the next 24 hours as above   Hypertensive emergency: -Blood pressure improved -Continue current medical therapy above   LBBB: -No syncope -Ischemic evaluation as above   Tobacco use: -Cessation recommended    For questions or updates, please contact [facility] Please consult [email or web address] for contact info under Cardiology/STEMI.    Signed, Eula Listen, PA-C [facility] Pager: [phone] 11/29/2020, 11:32 AM

## 2020-11-29 NOTE — Progress Notes (Signed)
PROGRESS NOTE  Evelyn Dunn VAN:[id number] DOB: [birth year] DOA: 11/25/2020 PCP: Pcp, No   LOS: 4 days   Brief Narrative / Interim history: 70 year old female with HTN, tobacco use, who comes to the hospital with shortness of breath.  This is been progressively getting worse over the last 4 days, worse with exertion and unable to lay flat.  Impression in the ED was acute on chronic diastolic CHF, she was placed on diuretics and was admitted to the hospital.  She was also significantly hypertensive.  Cardiology consulted as she was found to have a left bundle branch block and new onset systolic CHF.  Subjective / 24h Interval events: Feeling well this morning, she denies any chest pain, denies any shortness of breath   Assessment & Plan: Principal Problem Acute systolic CHF-patient was admitted to the hospital with fluid overload in the setting of acute systolic CHF.  A 2D echo done on 6/11 showed EF 20-25% with severely decreased LV function.  RV function was moderately reduced also.  Cardiology consulted and following -she was placed on Lasix and developed AKI, now on hold as she seems to be intravascularly dry. Cath pending improvement in her renal function -Continue metoprolol  Active Problems Hypertensive emergency-blood pressure now normalized  Tobacco use-cessation has been encouraged  Troponin elevation-flat, downtrending, not in a pattern consistent with ACS.  Likely demand ischemia from severe hypertension  Acute kidney injury on chronic kidney disease stage IIIa -creatinine is noted to be 1.3 on arrival, likely her baseline.  After receiving Lasix jumped to 2.5 then slightly worse at 2.7, but this suggests plateau. Anticipate will improve next -hold further Lasix, received limited fluids yesterday  Scheduled Meds:  enoxaparin (LOVENOX) injection  30 mg Subcutaneous Q24H   melatonin  5 mg Oral QHS   metoprolol succinate  25 mg Oral Daily   milk and molasses  1 enema Rectal  Once   nystatin cream   Topical BID   senna-docusate  2 tablet Oral QHS   sodium chloride flush  3 mL Intravenous Q12H   Continuous Infusions:  sodium chloride     PRN Meds:.sodium chloride, acetaminophen, hydrOXYzine, magnesium hydroxide, ondansetron (ZOFRAN) IV, sodium chloride flush  Diet Orders (From admission, onward)     Start     Ordered   11/25/20 1450  Diet 2 gram sodium Room service appropriate? Yes; Fluid consistency: Thin  Diet effective now       Question Answer Comment  Room service appropriate? Yes   Fluid consistency: Thin      11/25/20 1451            DVT prophylaxis: enoxaparin (LOVENOX) injection 30 mg Start: 11/29/20 1800     Code Status: Full Code  Family Communication: No family at bedside  Status is: Inpatient  Remains inpatient appropriate because:Inpatient level of care appropriate due to severity of illness  Dispo: The patient is from: Home              Anticipated d/c is to: Home              Patient currently is not medically stable to d/c.   Difficult to place patient No    Level of care: Progressive Cardiac  Consultants:  Cardiology  Procedures:  2D echo  1. Left ventricular ejection fraction, by estimation, is 20 to 25%. The left ventricle has severely decreased function. The left ventricle demonstrates global hypokinesis. The left ventricular internal cavity size was moderately dilated. There is mild  left ventricular hypertrophy. Left ventricular diastolic parameters are indeterminate.   2. Right ventricular systolic function is moderately reduced. The right ventricular size is normal.   3. Left atrial size was severely dilated.   4. Right atrial size was severely dilated.   5. The mitral valve is normal in structure. Moderate to severe mitral valve regurgitation.   6. The aortic valve was not well visualized. Aortic valve regurgitation is mild.   Microbiology  none  Antimicrobials: none    Objective: Vitals:    11/28/20 1958 11/29/20 0320 11/29/20 0715 11/29/20 1120  BP: 97/75 129/73 111/71 130/80  Pulse: 82 69 61 66  Resp: 17 18 18    Temp: 98.3 F (36.8 C) 98.6 F (37 C) 97.7 F (36.5 C)   TempSrc:   Oral   SpO2: 100% 96% 99% 95%  Weight:  92 kg    Height:        Intake/Output Summary (Last 24 hours) at 11/29/2020 1132 Last data filed at 11/29/2020 0950 Gross per 24 hour  Intake 885.36 ml  Output --  Net 885.36 ml    Filed Weights   11/27/20 0443 11/28/20 0500 11/29/20 0320  Weight: 95.7 kg 92.6 kg 92 kg    Examination:  Constitutional: She is in no distress, laying in bed Eyes: No scleral icterus ENMT: mmm Neck: normal, supple Respiratory: Lungs are clear bilaterally, no wheezing or crackles heard Cardiovascular: Regular rate and rhythm, no edema Abdomen: Soft, nontender, nondistended, positive bowel sounds Musculoskeletal: no clubbing / cyanosis.  Skin: No rashes seen Neurologic: No focal deficits  Data Reviewed: I have independently reviewed following labs and imaging studies   CBC: Recent Labs  Lab 11/25/20 1053 11/27/20 0501 11/28/20 0447 11/29/20 0543  WBC 4.5 6.1 7.5 6.5  NEUTROABS 2.4  --   --   --   HGB 14.4 13.3 14.3 14.1  HCT 41.9 38.9 44.3 42.3  MCV 94.2 96.0 101.4* 97.2  PLT 169 154 147* 148*    Basic Metabolic Panel: Recent Labs  Lab 11/25/20 1053 11/26/20 0500 11/27/20 0501 11/28/20 0447 11/29/20 0543  NA 137 138 140 137 136  K 3.6 3.7 3.3* 4.7 4.0  CL 104 108 109 105 102  CO2 21* 21* 24 17* 20*  GLUCOSE 134* 133* 137* 105* 115*  BUN 28* 29* 34* 45* 53*  CREATININE 1.30* 1.36* 1.53* 2.51* 2.75*  CALCIUM 9.2 8.8* 8.9 9.2 8.9  MG 2.2  --   --   --   --     Liver Function Tests: Recent Labs  Lab 11/25/20 1053  AST 28  ALT 8  ALKPHOS 45  BILITOT 1.6*  PROT 7.5  ALBUMIN 4.0    Coagulation Profile: Recent Labs  Lab 11/25/20 1053  INR 1.2    HbA1C: No results for input(s): HGBA1C in the last 72 hours.  CBG: No results  for input(s): GLUCAP in the last 168 hours.  Recent Results (from the past 240 hour(s))  Resp Panel by RT-PCR (Flu A&B, Covid) Nasopharyngeal Swab     Status: None   Collection Time: 11/25/20 11:18 AM   Specimen: Nasopharyngeal Swab; Nasopharyngeal(NP) swabs in vial transport medium  Result Value Ref Range Status   SARS Coronavirus 2 by RT PCR NEGATIVE NEGATIVE Final    Comment: (NOTE) SARS-CoV-2 target nucleic acids are NOT DETECTED.  The SARS-CoV-2 RNA is generally detectable in upper respiratory specimens during the acute phase of infection. The lowest concentration of SARS-CoV-2 viral copies this assay can detect  is 138 copies/mL. A negative result does not preclude SARS-Cov-2 infection and should not be used as the sole basis for treatment or other patient management decisions. A negative result may occur with  improper specimen collection/handling, submission of specimen other than nasopharyngeal swab, presence of viral mutation(s) within the areas targeted by this assay, and inadequate number of viral copies(<138 copies/mL). A negative result must be combined with clinical observations, patient history, and epidemiological information. The expected result is Negative.  Fact Sheet for Patients:  BloggerCourse.com  Fact Sheet for Healthcare Providers:  SeriousBroker.it  This test is no t yet approved or cleared by the [place] FDA and  has been authorized for detection and/or diagnosis of SARS-CoV-2 by FDA under an Emergency Use Authorization (EUA). This EUA will remain  in effect (meaning this test can be used) for the duration of the COVID-19 declaration under Section 564(b)(1) of the Act, 21 U.S.C.section 360bbb-3(b)(1), unless the authorization is terminated  or revoked sooner.       Influenza A by PCR NEGATIVE NEGATIVE Final   Influenza B by PCR NEGATIVE NEGATIVE Final    Comment: (NOTE) The Xpert Xpress  SARS-CoV-2/FLU/RSV plus assay is intended as an aid in the diagnosis of influenza from Nasopharyngeal swab specimens and should not be used as a sole basis for treatment. Nasal washings and aspirates are unacceptable for Xpert Xpress SARS-CoV-2/FLU/RSV testing.  Fact Sheet for Patients: BloggerCourse.com  Fact Sheet for Healthcare Providers: SeriousBroker.it  This test is not yet approved or cleared by the [place] FDA and has been authorized for detection and/or diagnosis of SARS-CoV-2 by FDA under an Emergency Use Authorization (EUA). This EUA will remain in effect (meaning this test can be used) for the duration of the COVID-19 declaration under Section 564(b)(1) of the Act, 21 U.S.C. section 360bbb-3(b)(1), unless the authorization is terminated or revoked.  Performed at [facility], [address] [place], [place] [address]       Radiology Studies: No results found.   Pamella Pert, MD, PhD [facility]  Between 7 am - 7 pm I am available, please contact me via Amion (for emergencies) or Securechat (non urgent messages)  Between 7 pm - 7 am I am not available, please contact night coverage MD/APP via Amion

## 2020-11-29 NOTE — Progress Notes (Signed)
Mobility Specialist - Progress Note   11/29/20 1541  Mobility  Activity Ambulated in hall  Level of Assistance Minimal assist, patient does 75% or more  Assistive Device Front wheel walker  Distance Ambulated (ft) 180 ft  Mobility Ambulated with assistance in hallway  Mobility Response Tolerated well  Mobility performed by Mobility specialist  $Mobility charge 1 Mobility    Pre-mobility: 72 HR, 96% SpO2 During mobility: 78 HR, 95% SpO2 Post-mobility: 73 HR, 95% SpO2   Pt lying semi-supine in bed upon arrival, utilizing RA. Pt motivated for session. O2 sat at 83% on arrival but quickly rebounded to high 90s prior to OOB. Pt denied pain, nausea, and fatigue. Pt was able to sit EOB with minA and extra time. Denied dizziness. Pt reports being independent in ADLs and denies use of AD PTA. Mildly unsteady to stand without AD, RW was provided for remainder of session. Pt ambulated in hallway, heavy breathing noted. O2 maintained > 93% during activity, still on RA. Pt reports fatigue in LE during ambulation. 3 seated rest breaks taken after every ~45' for activity tolerance. RPE 6/10 with breathing management reported as most difficult part of session. Pt returned supine with alarm set. RN notified of performance.    Filiberto Pinks Mobility Specialist 11/29/20, 3:55 PM

## 2020-11-30 DIAGNOSIS — I10 Essential (primary) hypertension: Secondary | ICD-10-CM

## 2020-11-30 DIAGNOSIS — I5021 Acute systolic (congestive) heart failure: Secondary | ICD-10-CM

## 2020-11-30 DIAGNOSIS — I447 Left bundle-branch block, unspecified: Secondary | ICD-10-CM

## 2020-11-30 LAB — CBC
HCT: 41.8 % (ref 36.0–46.0)
Hemoglobin: 13.9 g/dL (ref 12.0–15.0)
MCH: 32.5 pg (ref 26.0–34.0)
MCHC: 33.3 g/dL (ref 30.0–36.0)
MCV: 97.7 fL (ref 80.0–100.0)
Platelets: 154 10*3/uL (ref 150–400)
RBC: 4.28 MIL/uL (ref 3.87–5.11)
RDW: 13.4 % (ref 11.5–15.5)
WBC: 5.6 10*3/uL (ref 4.0–10.5)
nRBC: 0 % (ref 0.0–0.2)

## 2020-11-30 LAB — BASIC METABOLIC PANEL
Anion gap: 16 — ABNORMAL HIGH (ref 5–15)
BUN: 57 mg/dL — ABNORMAL HIGH (ref 8–23)
CO2: 20 mmol/L — ABNORMAL LOW (ref 22–32)
Calcium: 9 mg/dL (ref 8.9–10.3)
Chloride: 101 mmol/L (ref 98–111)
Creatinine, Ser: 2.54 mg/dL — ABNORMAL HIGH (ref 0.44–1.00)
GFR, Estimated: 20 mL/min — ABNORMAL LOW (ref >60)
Glucose, Bld: 106 mg/dL — ABNORMAL HIGH (ref 70–99)
Potassium: 3.9 mmol/L (ref 3.5–5.1)
Sodium: 137 mmol/L (ref 135–145)

## 2020-11-30 MED ORDER — ATORVASTATIN CALCIUM 20 MG PO TABS
40.0000 mg | ORAL_TABLET | Freq: Every day | ORAL | Status: DC
  Administered 2020-11-30 – 2020-12-08 (×10): 40 mg via ORAL
  Filled 2020-11-30 (×10): qty 2

## 2020-11-30 NOTE — Care Management Important Message (Signed)
Important Message  Patient Details  Name: CLOTILDA HAFER MRN: [id number] Date of Birth: 04/24/1951   Medicare Important Message Given:  Yes     Johnell Comings 11/30/2020, 11:06 AM

## 2020-11-30 NOTE — Progress Notes (Signed)
Mobility Specialist - Progress Note   11/30/20 1200  Mobility  Activity Refused mobility  Mobility performed by Mobility specialist    2nd attempt this date. Pt declined this AM, requested to return at lunch time to transfer to recliner. Upon second attempt, pt up in recliner eating lunch. Will attempt session another date.    Filiberto Pinks Mobility Specialist 11/30/20, 12:43 PM

## 2020-11-30 NOTE — Progress Notes (Signed)
[facility]  - [place] at [facility]   PATIENT NAME: Thomas Rhude    MR#:  [id number]  DATE OF BIRTH:  1950-11-14  SUBJECTIVE:  patient working with physical therapy walked to the sink. No chest pain. Feels her breathing is improving. Vitals are stable. Eating well.  REVIEW OF SYSTEMS:   Review of Systems  Constitutional:  Positive for malaise/fatigue. Negative for chills, fever and weight loss.  HENT:  Negative for ear discharge, ear pain and nosebleeds.   Eyes:  Negative for blurred vision, pain and discharge.  Respiratory:  Negative for sputum production, shortness of breath, wheezing and stridor.   Cardiovascular:  Negative for chest pain, palpitations, orthopnea and PND.  Gastrointestinal:  Negative for abdominal pain, diarrhea, nausea and vomiting.  Genitourinary:  Negative for frequency and urgency.  Musculoskeletal:  Negative for back pain and joint pain.  Neurological:  Positive for weakness. Negative for sensory change, speech change and focal weakness.  Psychiatric/Behavioral:  Negative for depression and hallucinations. The patient is not nervous/anxious.   Tolerating Diet:yes Tolerating PT: home health  DRUG ALLERGIES:  No Known Allergies  VITALS:  Blood pressure 131/87, pulse 88, temperature 97.8 F (36.6 C), resp. rate (!) 22, height 5\' 2"  (1.575 m), weight 92.2 kg, SpO2 94 %.  PHYSICAL EXAMINATION:   Physical Exam  GENERAL:  70 y.o.-year-old patient lying in the bed with no acute distress.  HEENT: Head atraumatic, normocephalic. Oropharynx and nasopharynx clear.  LUNGS: decreased breath sounds bilaterally, no wheezing, rales, rhonchi. No use of accessory muscles of respiration.  CARDIOVASCULAR: S1, S2 normal. No murmurs, rubs, or gallops.  ABDOMEN: Soft, nontender, nondistended. Bowel sounds present. No organomegaly or mass.  EXTREMITIES: No cyanosis, clubbing or edema b/l.    NEUROLOGIC: nonfocal PSYCHIATRIC:  patient is alert and oriented  x 2 SKIN: No obvious rash, lesion, or ulcer.   LABORATORY PANEL:  CBC Recent Labs  Lab 11/30/20 0708  WBC 5.6  HGB 13.9  HCT 41.8  PLT 154    Chemistries  Recent Labs  Lab 11/25/20 1053 11/26/20 0500 11/30/20 0708  NA 137   < > 137  K 3.6   < > 3.9  CL 104   < > 101  CO2 21*   < > 20*  GLUCOSE 134*   < > 106*  BUN 28*   < > 57*  CREATININE 1.30*   < > 2.54*  CALCIUM 9.2   < > 9.0  MG 2.2  --   --   AST 28  --   --   ALT 8  --   --   ALKPHOS 45  --   --   BILITOT 1.6*  --   --    < > = values in this interval not displayed.   Cardiac Enzymes No results for input(s): TROPONINI in the last 168 hours. RADIOLOGY:  No results found. ASSESSMENT AND PLAN:   70 year old female with HTN, tobacco use, who comes to the hospital with shortness of breath.  This is been progressively getting worse over the last 4 days, worse with exertion and unable to lay flat.  Impression in the ED was acute on chronic diastolic CHF, she was placed on diuretics and was admitted to the hospital.  She was also significantly hypertensive.  Cardiology consulted as she was found to have a left bundle branch block and new onset systolic CHF.  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 --  A 2D echo done on 6/11 showed EF 20-25% with severely decreased LV function.  RV function was moderately reduced also.   --[facility] consulted and following-she was placed on Lasix and developed AKI, now on hold as she seems to be intravascularly dry.  --Cath pending improvement in her renal function -Continue metoprolol   Hypertensive emergency-blood pressure now normalized  Tobacco use-cessation has been encouraged  Troponin elevation-flat, downtrending, not in a pattern consistent with ACS.  Likely demand ischemia from severe hypertension   Acute kidney injury on chronic kidney disease  stage IIIa  -creatinine is noted to be 1.3 on arrival, likely her baseline.  -- After receiving Lasix jumped to 2.5 then slightly worse at 2.7, but this suggests plateau. Anticipatte will improve next few days -hold further Lasix  Procedures: Family communication : left sister Corrie Dandy message  Consults : [facility] cardiology CODE STATUS: full DVT Prophylaxis : enoxaparin Level of care: Progressive Cardiac Status is: Inpatient  Remains inpatient appropriate because:Inpatient level of care appropriate due to severity of illness  Dispo: The patient is from: Home              Anticipated d/c is to: Home              Patient currently is not medically stable to d/c.   Difficult to place patient No  patient overall improving clinically. Awaiting creatinine to stabilize their after cardiac cath plan by cardiology.      TOTAL TIME TAKING CARE OF THIS PATIENT: 25 minutes.  >50% time spent on counselling and coordination of care  Note: This dictation was prepared with Dragon dictation along with smaller phrase technology. Any transcriptional errors that result from this process are unintentional.  Enedina Finner M.D    [facility]   CC: Primary care physician; Pcp, No Patient ID: [name], female   DOB: 11-Aug-1950, 70 y.o.   MRN: [id number]

## 2020-11-30 NOTE — Evaluation (Signed)
Physical Therapy Evaluation Patient Details Name: Evelyn Dunn MRN: [id number] DOB: [birth year] Today's Date: 11/30/2020   History of Present Illness  70 year old female with HTN, tobacco use, who comes to the hospital with shortness of breath.  This is been progressively getting worse over the last 4 days, worse with exertion and unable to lay flat.  Impression in the ED was acute on chronic diastolic CHF, she was placed on diuretics and was admitted to the hospital.  She was also significantly hypertensive.  Cardiology consulted as she was found to have a left bundle branch block and new onset systolic CHF.    Clinical Impression  Pt seated upon the EOB upon arrival to room and was agreeable to therapy.  Pt able to perform bed-level exercises with good technique and no complications.  Pt then proceeded to ambulate around the nursing station without any rest breaks, and good technique with FWW.  Pt then returned to bed with all needs met.  Pt SpO2 stayed above 92% throughout session.  Pt left sitting EOB with sister in room and all needs met.  Current discharge plans to HHPT remain appropriate at this time.  Pt will continue to benefit from skilled therapy in order to address deficits listed below.     Follow Up Recommendations Home health PT    Equipment Recommendations       Recommendations for Other Services       Precautions / Restrictions Precautions Precautions: Fall Restrictions Weight Bearing Restrictions: No      Mobility  Bed Mobility Overal bed mobility: Needs Assistance Bed Mobility: Sit to Supine;Supine to Sit     Supine to sit: Supervision Sit to supine: Supervision   General bed mobility comments: min cuing for technique and hand placement    Transfers Overall transfer level: Needs assistance Equipment used: 1 person hand held assist Transfers: Stand Pivot Transfers;Sit to/from Stand Sit to Stand: Supervision;Min guard Stand pivot transfers:  Supervision;Min guard       General transfer comment: min guard progressing to supervision during session  Ambulation/Gait Ambulation/Gait assistance: Supervision;Min guard Gait Distance (Feet): 170 Feet Assistive device: Rolling walker (2 wheeled) Gait Pattern/deviations: Step-through pattern;Decreased stride length     General Gait Details: Pt with slow, but safe gait pattern.  Stairs            Wheelchair Mobility    Modified Rankin (Stroke Patients Only)       Balance Overall balance assessment: Needs assistance Sitting-balance support: Feet supported Sitting balance-Leahy Scale: Good     Standing balance support: During functional activity Standing balance-Leahy Scale: Fair                               Pertinent Vitals/Pain Pain Assessment: No/denies pain    Home Living Family/patient expects to be discharged to:: Private residence Living Arrangements: Other relatives (brother) Available Help at Discharge: Family;Available PRN/intermittently Type of Home: House Home Access: Stairs to enter Entrance Stairs-Rails: Psychiatric nurse of Steps: 2 Home Layout: One level Home Equipment: Shower seat      Prior Function Level of Independence: Independent         Comments: Pt drives and reports I in self care and IADL tasks. Her and brother share IADL responsibilities. She drives and does her own grocery shopping.     Hand Dominance   Dominant Hand: Right    Extremity/Trunk Assessment   Upper Extremity Assessment Upper Extremity Assessment:  Generalized weakness    Lower Extremity Assessment Lower Extremity Assessment: Generalized weakness;Overall WFL for tasks assessed       Communication   Communication: No difficulties  Cognition Arousal/Alertness: Awake/alert Behavior During Therapy: WFL for tasks assessed/performed Overall Cognitive Status: Within Functional Limits for tasks assessed                                         General Comments      Exercises Total Joint Exercises Ankle Circles/Pumps: AROM;Strengthening;Both;10 reps;Supine Quad Sets: AROM;Strengthening;Both;10 reps;Supine Gluteal Sets: AROM;Strengthening;Both;10 reps;Supine Heel Slides: AROM;Strengthening;Both;10 reps;Supine Hip ABduction/ADduction: AROM;Strengthening;Both;10 reps;Supine Straight Leg Raises: AROM;Strengthening;Both;10 reps;Supine Marching in Standing: AROM;Strengthening;Both;10 reps;Supine Other Exercises Other Exercises: Pt was educated on roles of PT and services provided during hospital visit.   Assessment/Plan    PT Assessment Patient needs continued PT services  PT Problem List Decreased strength;Decreased activity tolerance;Decreased mobility       PT Treatment Interventions DME instruction;Gait training;Stair training;Functional mobility training;Therapeutic activities;Therapeutic exercise;Balance training;Neuromuscular re-education    PT Goals (Current goals can be found in the Care Plan section)  Acute Rehab PT Goals Patient Stated Goal: to go home PT Goal Formulation: With patient Time For Goal Achievement: 12/14/20 Potential to Achieve Goals: Good    Frequency Min 2X/week   Barriers to discharge        Co-evaluation               AM-PAC PT "6 Clicks" Mobility  Outcome Measure Help needed turning from your back to your side while in a flat bed without using bedrails?: A Little Help needed moving from lying on your back to sitting on the side of a flat bed without using bedrails?: A Little Help needed moving to and from a bed to a chair (including a wheelchair)?: A Little Help needed standing up from a chair using your arms (e.g., wheelchair or bedside chair)?: A Little Help needed to walk in hospital room?: A Little Help needed climbing 3-5 steps with a railing? : A Lot 6 Click Score: 17    End of Session Equipment Utilized During Treatment: Gait  belt Activity Tolerance: Patient tolerated treatment well Patient left: in bed;with call bell/phone within reach;with bed alarm set;with family/visitor present Nurse Communication: Mobility status PT Visit Diagnosis: Unsteadiness on feet (R26.81);Muscle weakness (generalized) (M62.81);History of falling (Z91.81);Difficulty in walking, not elsewhere classified (R26.2)    Time: 7017-7939 PT Time Calculation (min) (ACUTE ONLY): 30 min   Charges:   PT Evaluation $PT Eval Low Complexity: 1 Low PT Treatments $Gait Training: 8-22 mins $Therapeutic Exercise: 8-22 mins        Evelyn Dunn, PT, DPT 11/30/20, 4:21 PM   Christie Nottingham 11/30/2020, 4:15 PM

## 2020-11-30 NOTE — Progress Notes (Signed)
PT Cancellation Note  Patient Details Name: Evelyn Dunn MRN: [id number] DOB: 21-Nov-1950   Cancelled Treatment:    Reason Eval/Treat Not Completed: Patient declined, no reason specified.  PT chart reviewed pt and attempted to see.  Pt refused therapy this AM and requested to be seen following lunch.    Nolon Bussing, PT, DPT 11/30/20, 11:35 AM

## 2020-11-30 NOTE — Progress Notes (Signed)
Progress Note  Patient Name: Evelyn Dunn Date of Encounter: 11/30/2020  Primary Cardiologist: New to [facility] - consult by End  Subjective   Continues to feel like her breathing continues to improve. No chest pain. Renal function improving following the holding of Lasix for the past 2 days. Vitals stable.   Inpatient Medications    Scheduled Meds:  enoxaparin (LOVENOX) injection  30 mg Subcutaneous Q24H   isosorbide-hydrALAZINE  0.5 tablet Oral TID   melatonin  5 mg Oral QHS   metoprolol succinate  25 mg Oral Daily   milk and molasses  1 enema Rectal Once   nystatin cream   Topical BID   senna-docusate  2 tablet Oral QHS   sodium chloride flush  3 mL Intravenous Q12H   Continuous Infusions:  sodium chloride     PRN Meds: sodium chloride, acetaminophen, hydrOXYzine, magnesium hydroxide, ondansetron (ZOFRAN) IV, sodium chloride flush   Vital Signs    Vitals:   11/29/20 1944 11/30/20 0311 11/30/20 0747 11/30/20 0949  BP: 126/77 127/73 128/86   Pulse: 82 82 65   Resp: 16 18 18    Temp: 97.6 F (36.4 C)  97.9 F (36.6 C)   TempSrc:   Oral   SpO2: 99% 100% 94%   Weight:    92.2 kg  Height:        Intake/Output Summary (Last 24 hours) at 11/30/2020 1000 Last data filed at 11/29/2020 1411 Gross per 24 hour  Intake 120 ml  Output --  Net 120 ml    Filed Weights   11/28/20 0500 11/29/20 0320 11/30/20 0949  Weight: 92.6 kg 92 kg 92.2 kg    Telemetry    SR - Personally Reviewed  ECG    No new tracings - Personally Reviewed  Physical Exam   GEN: No acute distress.   Neck: No JVD. Cardiac: RRR, II/VI systolic murmur at the apex, no rubs, or gallops.  Respiratory: Clear to auscultation bilaterally.  GI: Soft, nontender, non-distended.   MS: No edema; No deformity. Neuro:  Alert and oriented x 3; Nonfocal.  Psych: Normal affect.  Labs    Chemistry Recent Labs  Lab 11/25/20 1053 11/26/20 0500 11/28/20 0447 11/29/20 0543 11/30/20 0708  NA 137   <  > 137 136 137  K 3.6   < > 4.7 4.0 3.9  CL 104   < > 105 102 101  CO2 21*   < > 17* 20* 20*  GLUCOSE 134*   < > 105* 115* 106*  BUN 28*   < > 45* 53* 57*  CREATININE 1.30*   < > 2.51* 2.75* 2.54*  CALCIUM 9.2   < > 9.2 8.9 9.0  PROT 7.5  --   --   --   --   ALBUMIN 4.0  --   --   --   --   AST 28  --   --   --   --   ALT 8  --   --   --   --   ALKPHOS 45  --   --   --   --   BILITOT 1.6*  --   --   --   --   GFRNONAA 45*   < > 20* 18* 20*  ANIONGAP 12   < > 15 14 16*   < > = values in this interval not displayed.      Hematology Recent Labs  Lab 11/28/20 0447 11/29/20 0543 11/30/20  0708  WBC 7.5 6.5 5.6  RBC 4.37 4.35 4.28  HGB 14.3 14.1 13.9  HCT 44.3 42.3 41.8  MCV 101.4* 97.2 97.7  MCH 32.7 32.4 32.5  MCHC 32.3 33.3 33.3  RDW 13.8 13.6 13.4  PLT 147* 148* 154     Cardiac EnzymesNo results for input(s): TROPONINI in the last 168 hours. No results for input(s): TROPIPOC in the last 168 hours.   BNP Recent Labs  Lab 11/25/20 1053  BNP 1,370.8*      DDimer No results for input(s): DDIMER in the last 168 hours.   Radiology    No results found.  Cardiac Studies   2D echo 11/26/2020: 1. Left ventricular ejection fraction, by estimation, is 20 to 25%. The  left ventricle has severely decreased function. The left ventricle  demonstrates global hypokinesis. The left ventricular internal cavity size  was moderately dilated. There is mild  left ventricular hypertrophy. Left ventricular diastolic parameters are  indeterminate.   2. Right ventricular systolic function is moderately reduced. The right  ventricular size is normal.   3. Left atrial size was severely dilated.   4. Right atrial size was severely dilated.   5. The mitral valve is normal in structure. Moderate to severe mitral  valve regurgitation.   6. The aortic valve was not well visualized. Aortic valve regurgitation  is mild.  Patient Profile     70 y.o. female with history of HTN and  tobacco use who has not seen a physician as an outpatient in several years admitted with sudden onset of dyspnea and hypertensive emergency who we are seeing for acute HFrEF, moderate to severe mitral regurgitation, and LBBB with worsening renal function noted with IV diuresis.   Assessment & Plan    Acute HFrEF: -Dyspnea much improved overall -She does not appear grossly volume up -Lasix was stopped 6/13 with up trending renal function at that time, which is now improving -Continue to hold Lasix and trend renal function on a daily basis with recommendation for [facility] based on renal function trend, stability, and is back to presumed baseline -Will hold off on adding milrinone at this time given improvement in renal function -Toprol XL and low dose BiDil -Escalate GDMT as able -CHF education discussed in detail -Daily weights -Strict I/O   Elevated high sensitivity troponin: -Mildly elevated and down trending, not consistent with ACS -Echo as above -R/LHC prior to discharge as above   Moderate to severe mitral regurgitation: -Will need a TEE following improvement in clinical picture, possibly as an outpatient   AKI: -Improving following the holding of Lasix for the past 48 hours, continue to hold Lasix -Cannot exclude some degree of underlying renal dysfunction at baseline (no old labs)   Hypertensive emergency: -Blood pressure improved -Continue current medical therapy above   LBBB: -No syncope -Ischemic evaluation as above   Tobacco use: -Cessation recommended    For questions or updates, please contact [facility] Please consult [email or web address] for contact info under Cardiology/STEMI.    Signed, Eula Listen, PA-C [facility] Pager: [phone] 11/30/2020, 10:00 AM

## 2020-11-30 NOTE — Evaluation (Signed)
Occupational Therapy Evaluation Patient Details Name: Evelyn Dunn MRN: [id number] DOB: 1951-01-18 Today's Date: 11/30/2020    History of Present Illness 70 year old female with HTN, tobacco use, who comes to the hospital with shortness of breath.  This is been progressively getting worse over the last 4 days, worse with exertion and unable to lay flat.  Impression in the ED was acute on chronic diastolic CHF, she was placed on diuretics and was admitted to the hospital.  She was also significantly hypertensive.  Cardiology consulted as she was found to have a left bundle branch block and new onset systolic CHF.   Clinical Impression   Patient presenting with decreased I in self care, balance, functional mobility/transfers, endurance, and safety awareness. Patient reports living at home independently with brother PTA. Pt drives herself to appointments and to grocery store. She shares IADL tasks with her brother in the home. They have other siblings nearby that assist as needed.Patient currently functioning at min guard and progressing to supervision during session without use of RW. Pt does fatigue quickly but is motivated for OT intervention.  Patient will benefit from acute OT to increase overall independence in the areas of ADLs, functional mobility, and safety awareness in order to safely discharge home.     Follow Up Recommendations  Home health OT;Supervision - Intermittent    Equipment Recommendations  None recommended by OT       Precautions / Restrictions Precautions Precautions: Fall Restrictions Weight Bearing Restrictions: No      Mobility Bed Mobility Overal bed mobility: Needs Assistance Bed Mobility: Sit to Supine;Supine to Sit     Supine to sit: Supervision Sit to supine: Supervision   General bed mobility comments: min cuing for techique and hand placement    Transfers Overall transfer level: Needs assistance Equipment used: 1 person hand held  assist Transfers: Stand Pivot Transfers;Sit to/from Stand Sit to Stand: Supervision;Min guard Stand pivot transfers: Supervision;Min guard       General transfer comment: min guard progressing to supervision during session    Balance Overall balance assessment: Needs assistance Sitting-balance support: Feet supported Sitting balance-Leahy Scale: Good     Standing balance support: During functional activity Standing balance-Leahy Scale: Fair                             ADL either performed or assessed with clinical judgement   ADL Overall ADL's : Needs assistance/impaired Eating/Feeding: Independent   Grooming: Wash/dry hands;Wash/dry face;Brushing hair;Standing;Supervision/safety               Lower Body Dressing: Min guard;Sit to/from stand   Toilet Transfer: Environmental education officer- Architect and Hygiene: Min guard;Sit to/from stand       Functional mobility during ADLs: Min guard;Supervision/safety       Vision Baseline Vision/History: Wears glasses Patient Visual Report: No change from baseline              Pertinent Vitals/Pain Pain Assessment: No/denies pain     Hand Dominance Right   Extremity/Trunk Assessment Upper Extremity Assessment Upper Extremity Assessment: Overall WFL for tasks assessed;Generalized weakness;RUE deficits/detail RUE Deficits / Details: Pt reports pain in shoulder with elevation   Lower Extremity Assessment Lower Extremity Assessment: Generalized weakness;Overall WFL for tasks assessed       Communication Communication Communication: No difficulties   Cognition Arousal/Alertness: Awake/alert Behavior During Therapy: WFL for tasks assessed/performed Overall Cognitive Status: Within Functional Limits for  tasks assessed                                                 Home Living Family/patient expects to be discharged to:: Private residence Living  Arrangements: Other relatives (brother) Available Help at Discharge: Family;Available PRN/intermittently Type of Home: House Home Access: Stairs to enter [employer] of Steps: 2 Entrance Stairs-Rails: Right;Left Home Layout: One level     Bathroom Shower/Tub: Runner, broadcasting/film/video: Shower seat          Prior Functioning/Environment Level of Independence: Independent        Comments: Pt drives and reports I in self care and IADL tasks. Her and brother share IADL responsibilities. She drives and does her own grocery shopping.        OT Problem List: Decreased strength;Decreased activity tolerance;Impaired balance (sitting and/or standing);Decreased safety awareness      OT Treatment/Interventions: Self-care/ADL training;Manual therapy;Therapeutic exercise;Patient/family education;Balance training;Energy conservation;Therapeutic activities;DME and/or AE instruction    OT Goals(Current goals can be found in the care plan section) Acute Rehab OT Goals Patient Stated Goal: to go home OT Goal Formulation: With patient Time For Goal Achievement: 12/14/20 Potential to Achieve Goals: Good ADL Goals Pt Will Perform Grooming: with modified independence;standing Pt Will Perform Lower Body Dressing: with modified independence;sit to/from stand Pt Will Transfer to Toilet: with modified independence;ambulating Pt Will Perform Toileting - Clothing Manipulation and hygiene: with modified independence;sit to/from stand  OT Frequency: Min 2X/week   Barriers to D/C:    none known at this time          AM-PAC OT "6 Clicks" Daily Activity     Outcome Measure Help from another person eating meals?: None Help from another person taking care of personal grooming?: None Help from another person toileting, which includes using toliet, bedpan, or urinal?: A Little Help from another person bathing (including washing, rinsing, drying)?: A Little Help from another  person to put on and taking off regular upper body clothing?: None Help from another person to put on and taking off regular lower body clothing?: A Little 6 Click Score: 21   End of Session Nurse Communication: Mobility status  Activity Tolerance: Patient tolerated treatment well Patient left: in bed;with call bell/phone within reach;with bed alarm set  OT Visit Diagnosis: Unsteadiness on feet (R26.81);Muscle weakness (generalized) (M62.81)                Time: 9169-4503 OT Time Calculation (min): 22 min Charges:  OT General Charges $OT Visit: 1 Visit OT Evaluation $OT Eval Low Complexity: 1 Low OT Treatments $Self Care/Home Management : 8-22 mins  Jackquline Denmark, MS, OTR/L , CBIS ascom [phone]  11/30/20, 11:24 AM

## 2020-12-01 DIAGNOSIS — F17213 Nicotine dependence, cigarettes, with withdrawal: Secondary | ICD-10-CM

## 2020-12-01 NOTE — Progress Notes (Signed)
   12/01/20 1223  Clinical Encounter Type  Visited With Patient  Visit Type Initial  Referral From Chaplain  Consult/Referral To Chaplain  Spiritual Encounters  Spiritual Needs Prayer;Emotional  Chaplain Lajada Janes visited room 2A-245A Pt, Mrs. Evelyn Dunn. Pt shared with me some of her medical history. I provided reflective listening and words of encouragement. Prayer was provided and accepted at the end of the visit. Pt thanked me for the visited and told me to have a good day.

## 2020-12-01 NOTE — TOC Initial Note (Signed)
Transition of Care [facility]) - Initial/Assessment Note    Patient Details  Name: Evelyn Dunn MRN: [id number] Date of Birth: 12/22/1950  [facility] of [facility]) CM/SW Contact:    Gildardo Griffes, LCSW Phone Number: 12/01/2020, 4:03 PM  Clinical Narrative:                  CSW spoke with patient regarding home health recommendations. Patient is in agreement with CSW to send referrals for home health acceptance. Patient was accepted by [facility] for Home Health PT and RN.   Patient reports she lives home with her brother, states her sister visits every day. Patient reports she has a walker at home and is not interested in a 3in1 at this time. No DME needs identified.   CSW to continue to follow for discharge planning.   Expected Discharge Plan: Home w Home Health Services Barriers to Discharge: Continued Medical Work up   Patient Goals and CMS Choice Patient states their goals for this hospitalization and ongoing recovery are:: to go home CMS Medicare.gov Compare Post Acute Care list provided to:: Patient Choice offered to / list presented to : Patient  Expected Discharge Plan and Services Expected Discharge Plan: Home w Home Health Services       Living arrangements for the past 2 months: Single Family Home                           HH Arranged: PT, RN [facility] Agency: [facility] Date [facility] Agency Contacted: 12/01/20 Time HH Agency Contacted: 1602 Representative spoke with at [facility] Agency: Brittney  Prior Living Arrangements/Services Living arrangements for the past 2 months: Single Family Home Lives with:: Siblings   Do you feel safe going back to the place where you live?: Yes               Activities of Daily Living Home Assistive Devices/Equipment: None ADL Screening (condition at time of admission) Patient's cognitive ability adequate to safely complete daily activities?: Yes Is the patient deaf or have difficulty hearing?: No Does the patient have difficulty  seeing, even when wearing glasses/contacts?: No Does the patient have difficulty concentrating, remembering, or making decisions?: No Patient able to express need for assistance with ADLs?: Yes Does the patient have difficulty dressing or bathing?: No Independently performs ADLs?: Yes (appropriate for developmental age) Does the patient have difficulty walking or climbing stairs?: No Weakness of Legs: None Weakness of Arms/Hands: None  Permission Sought/Granted                  Emotional Assessment       Orientation: : Oriented to Self, Oriented to Place, Oriented to  Time, Oriented to Situation      Admission diagnosis:  Shortness of breath [R06.02] LBBB (left bundle branch block) [I44.7] Pleural effusion [J90] Tachycardia [R00.0] Acute CHF (congestive heart failure) (HCC) [I50.9] Congestive heart failure, unspecified HF chronicity, unspecified heart failure type (HCC) [I50.9] Patient Active Problem List   Diagnosis Date Noted   LBBB (left bundle branch block)    Acute systolic CHF (congestive heart failure) (HCC)    Primary hypertension    Mitral valve insufficiency    Acute CHF (congestive heart failure) (HCC) 11/25/2020   Hypertensive urgency 11/25/2020   Nicotine dependence 11/25/2020   Elevated troponin 11/25/2020   PCP:  Pcp, No Pharmacy:  No Pharmacies Listed    Social Determinants of Health (SDOH) Interventions    Readmission  Risk Interventions No flowsheet data found.

## 2020-12-01 NOTE — Progress Notes (Signed)
[facility]  - [place] [facility]   PATIENT NAME: Evelyn Dunn    MR#:  [id number]  DATE OF BIRTH:  Oct 02, 1950  SUBJECTIVE:  patient overall doing well. No chest pain. Slept good overnight. No new issues per RN. REVIEW OF SYSTEMS:   Review of Systems  Constitutional:  Positive for malaise/fatigue. Negative for chills, fever and weight loss.  HENT:  Negative for ear discharge, ear pain and nosebleeds.   Eyes:  Negative for blurred vision, pain and discharge.  Respiratory:  Negative for sputum production, shortness of breath, wheezing and stridor.   Cardiovascular:  Negative for chest pain, palpitations, orthopnea and PND.  Gastrointestinal:  Negative for abdominal pain, diarrhea, nausea and vomiting.  Genitourinary:  Negative for frequency and urgency.  Musculoskeletal:  Negative for back pain and joint pain.  Neurological:  Positive for weakness. Negative for sensory change, speech change and focal weakness.  Psychiatric/Behavioral:  Negative for depression and hallucinations. The patient is not nervous/anxious.   Tolerating Diet:yes Tolerating PT: home health  DRUG ALLERGIES:  No Known Allergies  VITALS:  Blood pressure (!) 111/59, pulse 70, temperature 97.8 F (36.6 C), resp. rate 17, height 5\' 2"  (1.575 m), weight 91.9 kg, SpO2 97 %.  PHYSICAL EXAMINATION:   Physical Exam  GENERAL:  70 y.o.-year-old patient lying in the bed with no acute distress.  Obese HEENT: Head atraumatic, normocephalic. Oropharynx and nasopharynx clear.  LUNGS: decreased breath sounds bilaterally, no wheezing, rales, rhonchi. No use of accessory muscles of respiration.  CARDIOVASCULAR: S1, S2 normal. No murmurs, rubs, or gallops.  ABDOMEN: Soft, nontender, nondistended. Bowel sounds present. No organomegaly or mass.  EXTREMITIES: No cyanosis, clubbing or edema b/l.    NEUROLOGIC: nonfocal PSYCHIATRIC:  patient is alert and oriented x 2 SKIN: No obvious rash, lesion, or ulcer.    LABORATORY PANEL:  CBC Recent Labs  Lab 11/30/20 0708  WBC 5.6  HGB 13.9  HCT 41.8  PLT 154     Chemistries  Recent Labs  Lab 11/25/20 1053 11/26/20 0500 11/30/20 0708  NA 137   < > 137  K 3.6   < > 3.9  CL 104   < > 101  CO2 21*   < > 20*  GLUCOSE 134*   < > 106*  BUN 28*   < > 57*  CREATININE 1.30*   < > 2.54*  CALCIUM 9.2   < > 9.0  MG 2.2  --   --   AST 28  --   --   ALT 8  --   --   ALKPHOS 45  --   --   BILITOT 1.6*  --   --    < > = values in this interval not displayed.    Cardiac Enzymes No results for input(s): TROPONINI in the last 168 hours. RADIOLOGY:  No results found. ASSESSMENT AND PLAN:   70 year old female with HTN, tobacco use, who comes to the hospital with shortness of breath.  This is been progressively getting worse over the last 4 days, worse with exertion and unable to lay flat.  Impression in the ED was acute on chronic diastolic CHF, she was placed on diuretics and was admitted to the hospital.  She was also significantly hypertensive.  Cardiology consulted as she was found to have a left bundle branch block and new onset systolic CHF.  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 --  A 2D echo done on 6/11 showed EF 20-25% with severely decreased LV function.  RV function was moderately reduced also.   --[facility] Cardiology consulted and following-she was placed on Lasix and developed AKI, now on hold  --Cath pending improvement in her renal function -Continue metoprolol   Hypertensive emergency-blood pressure now normalized  Tobacco use-cessation has been encouraged  Troponin elevation-flat, downtrending, not in a pattern consistent with ACS.  Likely demand ischemia from severe hypertension   Acute kidney injury on chronic kidney disease stage IIIa  -creatinine is noted to be 1.3 on arrival, likely her baseline.  --  After receiving Lasix jumped to 2.5 then slightly worse at 2.7, but this suggests plateau. Anticipatte will improve next few days -hold further Lasix --BMP in am  Procedures: Family communication : left sister Corrie Dandy message  Consults : [facility] cardiology CODE STATUS: full DVT Prophylaxis : enoxaparin Level of care: Progressive Cardiac Status is: Inpatient  Remains inpatient appropriate because:Inpatient level of care appropriate due to severity of illness  Dispo: The patient is from: Home              Anticipated d/c is to: Home              Patient currently is not medically stable to d/c.   Difficult to place patient No  patient overall improving clinically. Awaiting creatinine to stabilize their after cardiac cath plan by cardiology.      TOTAL TIME TAKING CARE OF THIS PATIENT: 25 minutes.  >50% time spent on counselling and coordination of care  Note: This dictation was prepared with Dragon dictation along with smaller phrase technology. Any transcriptional errors that result from this process are unintentional.  Enedina Finner M.D    [facility]   CC: Primary care physician; Pcp, No Patient ID: [name], female   DOB: [birth year], 70 y.o.   MRN: [id number]

## 2020-12-01 NOTE — Plan of Care (Signed)

## 2020-12-01 NOTE — Progress Notes (Signed)
   Heart Failure Nurse Navigator Note  HFrEF 20-25%.  Normal right ventricular systolic function.  Moderate to severe mitral regurgitation.   Met with, she states that she is heart failure information.  Asked her what changes she needed to make and she answered more fresh fruits and vegetables, lean meats.  Less eating at restaurants.  Also discussed her smoking habit, she states that she smokes daily and now has been in the hospital 6 days and feels that she can give it up.  Reinforced that that is one of the things that she can do for herself.  She states that the doctors are waiting for her kidney function before she can have the heart cath.   Pricilla Riffle RN [facility]

## 2020-12-01 NOTE — Progress Notes (Signed)
Progress Note  Patient Name: Evelyn Dunn Date of Encounter: 12/01/2020  Primary Cardiologist: New to [facility] - consult by End  Subjective   Dyspnea continues to improve. No chest pain. Renal function improving following the holding of Lasix for the past 2 days. Vitals stable.   Inpatient Medications    Scheduled Meds:  atorvastatin  40 mg Oral Daily   enoxaparin (LOVENOX) injection  30 mg Subcutaneous Q24H   isosorbide-hydrALAZINE  0.5 tablet Oral TID   melatonin  5 mg Oral QHS   metoprolol succinate  25 mg Oral Daily   milk and molasses  1 enema Rectal Once   nystatin cream   Topical BID   senna-docusate  2 tablet Oral QHS   sodium chloride flush  3 mL Intravenous Q12H   Continuous Infusions:  sodium chloride     PRN Meds: sodium chloride, acetaminophen, hydrOXYzine, magnesium hydroxide, ondansetron (ZOFRAN) IV, sodium chloride flush   Vital Signs    Vitals:   11/30/20 1945 12/01/20 0358 12/01/20 0425 12/01/20 0746  BP: (!) 132/101 (!) 118/57  (!) 142/75  Pulse: 93 62  65  Resp: 18 18  18   Temp: 98.2 F (36.8 C) 98.1 F (36.7 C)  97.7 F (36.5 C)  TempSrc:      SpO2: 99% 94%  98%  Weight:   91.9 kg   Height:        Intake/Output Summary (Last 24 hours) at 12/01/2020 0845 Last data filed at 11/30/2020 1016 Gross per 24 hour  Intake 240 ml  Output --  Net 240 ml    Filed Weights   11/29/20 0320 11/30/20 0949 12/01/20 0425  Weight: 92 kg 92.2 kg 91.9 kg    Telemetry    SR - Personally Reviewed  ECG    No new tracings - Personally Reviewed  Physical Exam   GEN: No acute distress.   Neck: No JVD. Cardiac: RRR, II/VI systolic murmur at the apex, no rubs, or gallops.  Respiratory: Clear to auscultation bilaterally.  GI: Soft, nontender, non-distended.   MS: No edema; No deformity. Neuro:  Alert and oriented x 3; Nonfocal.  Psych: Normal affect.  Labs    Chemistry Recent Labs  Lab 11/25/20 1053 11/26/20 0500 11/28/20 0447  11/29/20 0543 11/30/20 0708  NA 137   < > 137 136 137  K 3.6   < > 4.7 4.0 3.9  CL 104   < > 105 102 101  CO2 21*   < > 17* 20* 20*  GLUCOSE 134*   < > 105* 115* 106*  BUN 28*   < > 45* 53* 57*  CREATININE 1.30*   < > 2.51* 2.75* 2.54*  CALCIUM 9.2   < > 9.2 8.9 9.0  PROT 7.5  --   --   --   --   ALBUMIN 4.0  --   --   --   --   AST 28  --   --   --   --   ALT 8  --   --   --   --   ALKPHOS 45  --   --   --   --   BILITOT 1.6*  --   --   --   --   GFRNONAA 45*   < > 20* 18* 20*  ANIONGAP 12   < > 15 14 16*   < > = values in this interval not displayed.      Hematology  Recent Labs  Lab 11/28/20 0447 11/29/20 0543 11/30/20 0708  WBC 7.5 6.5 5.6  RBC 4.37 4.35 4.28  HGB 14.3 14.1 13.9  HCT 44.3 42.3 41.8  MCV 101.4* 97.2 97.7  MCH 32.7 32.4 32.5  MCHC 32.3 33.3 33.3  RDW 13.8 13.6 13.4  PLT 147* 148* 154     Cardiac EnzymesNo results for input(s): TROPONINI in the last 168 hours. No results for input(s): TROPIPOC in the last 168 hours.   BNP Recent Labs  Lab 11/25/20 1053  BNP 1,370.8*      DDimer No results for input(s): DDIMER in the last 168 hours.   Radiology    No results found.  Cardiac Studies   2D echo 11/26/2020: 1. Left ventricular ejection fraction, by estimation, is 20 to 25%. The  left ventricle has severely decreased function. The left ventricle  demonstrates global hypokinesis. The left ventricular internal cavity size  was moderately dilated. There is mild  left ventricular hypertrophy. Left ventricular diastolic parameters are  indeterminate.   2. Right ventricular systolic function is moderately reduced. The right  ventricular size is normal.   3. Left atrial size was severely dilated.   4. Right atrial size was severely dilated.   5. The mitral valve is normal in structure. Moderate to severe mitral  valve regurgitation.   6. The aortic valve was not well visualized. Aortic valve regurgitation  is mild.  Patient Profile      70 y.o. female with history of HTN and tobacco use who has not seen a physician as an outpatient in several years admitted with sudden onset of dyspnea and hypertensive emergency who we are seeing for acute HFrEF, moderate to severe mitral regurgitation, and LBBB with worsening renal function noted with IV diuresis.   Assessment & Plan    Acute HFrEF: -Dyspnea much improved overall -She does not appear grossly volume up -Lasix was stopped 6/13 with up trending renal function at that time, which is now improving -Continue to hold Lasix and trend renal function on a daily basis with recommendation for [facility] based on renal function trend, stability, and is back to presumed baseline, possibly early next week -Will hold off on adding milrinone at this time given improvement in renal function -Toprol XL and low dose BiDil -Escalate GDMT as able -CHF education discussed in detail -Daily weights -Strict I/O   Elevated high sensitivity troponin: -Mildly elevated and down trending, not consistent with ACS -Echo as above -R/LHC prior to discharge as above   Moderate to severe mitral regurgitation: -Will need a TEE following improvement in clinical picture, possibly as an outpatient   AKI: -Improving following the holding of Lasix for the past 48 hours, continue to hold Lasix -Cannot exclude some degree of underlying renal dysfunction at baseline (no old labs) -BMP ordered for 6/17   Hypertensive emergency: -Blood pressure improved -Continue current medical therapy above   LBBB: -No syncope -Ischemic evaluation as above   Tobacco use: -Cessation recommended    For questions or updates, please contact [facility] Please consult [email or web address] for contact info under Cardiology/STEMI.    Signed, Eula Listen, PA-C [facility] Pager: [phone] 12/01/2020, 8:45 AM

## 2020-12-01 NOTE — Progress Notes (Signed)
OT Cancellation Note  Patient Details Name: Evelyn Dunn MRN: [id number] DOB: 23-Jun-1950   Cancelled Treatment:    Reason Eval/Treat Not Completed: Patient declined, no reason specified. Pt in bed, denies needs and declines OT intervention at this time. Will re-attempt as able.  Wynona Canes, MPH, MS, OTR/L ascom [phone] 12/01/20, 11:50 AM

## 2020-12-02 ENCOUNTER — Inpatient Hospital Stay: Payer: Medicare PPO

## 2020-12-02 DIAGNOSIS — R000 Tachycardia, unspecified: Secondary | ICD-10-CM | POA: Diagnosis not present

## 2020-12-02 DIAGNOSIS — I160 Hypertensive urgency: Secondary | ICD-10-CM | POA: Diagnosis not present

## 2020-12-02 LAB — BASIC METABOLIC PANEL
Anion gap: 7 (ref 5–15)
BUN: 39 mg/dL — ABNORMAL HIGH (ref 8–23)
CO2: 22 mmol/L (ref 22–32)
Calcium: 8.7 mg/dL — ABNORMAL LOW (ref 8.9–10.3)
Chloride: 108 mmol/L (ref 98–111)
Creatinine, Ser: 1.45 mg/dL — ABNORMAL HIGH (ref 0.44–1.00)
GFR, Estimated: 39 mL/min — ABNORMAL LOW (ref >60)
Glucose, Bld: 121 mg/dL — ABNORMAL HIGH (ref 70–99)
Potassium: 4.7 mmol/L (ref 3.5–5.1)
Sodium: 137 mmol/L (ref 135–145)

## 2020-12-02 LAB — D-DIMER, QUANTITATIVE: D-Dimer, Quant: 1.73 ug/mL-FEU — ABNORMAL HIGH (ref 0.00–0.50)

## 2020-12-02 MED ORDER — LIDOCAINE-PRILOCAINE 2.5-2.5 % EX CREA
TOPICAL_CREAM | Freq: Three times a day (TID) | CUTANEOUS | Status: DC | PRN
  Filled 2020-12-02: qty 5

## 2020-12-02 MED ORDER — DICLOFENAC SODIUM 1 % EX GEL
2.0000 g | Freq: Three times a day (TID) | CUTANEOUS | Status: DC
  Administered 2020-12-02 – 2020-12-06 (×13): 2 g via TOPICAL
  Filled 2020-12-02: qty 100

## 2020-12-02 MED ORDER — TORSEMIDE 20 MG PO TABS
20.0000 mg | ORAL_TABLET | Freq: Every day | ORAL | Status: DC
  Administered 2020-12-02 – 2020-12-03 (×2): 20 mg via ORAL
  Filled 2020-12-02 (×2): qty 1

## 2020-12-02 MED ORDER — LOSARTAN POTASSIUM 25 MG PO TABS
25.0000 mg | ORAL_TABLET | Freq: Every day | ORAL | Status: DC
  Administered 2020-12-02 – 2020-12-03 (×2): 25 mg via ORAL
  Filled 2020-12-02 (×2): qty 1

## 2020-12-02 MED ORDER — ENOXAPARIN SODIUM 100 MG/ML IJ SOSY
1.0000 mg/kg | PREFILLED_SYRINGE | Freq: Two times a day (BID) | INTRAMUSCULAR | Status: DC
  Administered 2020-12-02 – 2020-12-05 (×7): 90 mg via SUBCUTANEOUS
  Filled 2020-12-02 (×8): qty 0.9

## 2020-12-02 MED ORDER — LIDOCAINE 5 % EX OINT
TOPICAL_OINTMENT | Freq: Three times a day (TID) | CUTANEOUS | Status: DC | PRN
  Administered 2020-12-08: 1 via TOPICAL
  Filled 2020-12-02: qty 35.44

## 2020-12-02 MED ORDER — HYDROCODONE-ACETAMINOPHEN 5-325 MG PO TABS
1.0000 | ORAL_TABLET | Freq: Four times a day (QID) | ORAL | Status: DC | PRN
  Administered 2020-12-02 – 2020-12-08 (×11): 2 via ORAL
  Filled 2020-12-02 (×12): qty 2

## 2020-12-02 MED ORDER — BENZONATATE 100 MG PO CAPS
200.0000 mg | ORAL_CAPSULE | Freq: Three times a day (TID) | ORAL | Status: DC | PRN
  Administered 2020-12-02 – 2020-12-04 (×5): 200 mg via ORAL
  Filled 2020-12-02 (×5): qty 2

## 2020-12-02 MED ORDER — ENOXAPARIN SODIUM 40 MG/0.4ML IJ SOSY: 40.0000 mg | PREFILLED_SYRINGE | INTRAMUSCULAR | Status: DC

## 2020-12-02 MED ORDER — LIDOCAINE HCL URETHRAL/MUCOSAL 2 % EX GEL
1.0000 "application " | Freq: Three times a day (TID) | CUTANEOUS | Status: DC | PRN
  Filled 2020-12-02: qty 5

## 2020-12-02 MED ORDER — MORPHINE SULFATE (PF) 2 MG/ML IV SOLN
1.0000 mg | Freq: Once | INTRAVENOUS | Status: AC
  Administered 2020-12-02: 1 mg via INTRAVENOUS
  Filled 2020-12-02: qty 1

## 2020-12-02 MED ORDER — ALUM & MAG HYDROXIDE-SIMETH 200-200-20 MG/5ML PO SUSP
30.0000 mL | ORAL | Status: DC | PRN
  Administered 2020-12-02 – 2020-12-09 (×2): 30 mL via ORAL
  Filled 2020-12-02 (×3): qty 30

## 2020-12-02 NOTE — Progress Notes (Signed)
Progress Note  Patient Name: Evelyn Dunn Date of Encounter: 12/02/2020  [facility] Cardiologist:Dr. End  Subjective   Long discussion with her today, Explained that her renal function had improved, might be a candidate for catheterization today " Better to wait till Monday" Spending much of her time in the bed Denies chest pain concerning for angina, she does have shortness of breath on minimal exertion   Inpatient Medications    Scheduled Meds:  atorvastatin  40 mg Oral Daily   enoxaparin (LOVENOX) injection  40 mg Subcutaneous Q24H   isosorbide-hydrALAZINE  0.5 tablet Oral TID   melatonin  5 mg Oral QHS   metoprolol succinate  25 mg Oral Daily   milk and molasses  1 enema Rectal Once   nystatin cream   Topical BID   senna-docusate  2 tablet Oral QHS   sodium chloride flush  3 mL Intravenous Q12H   Continuous Infusions:  sodium chloride     PRN Meds: sodium chloride, acetaminophen, benzonatate, hydrOXYzine, lidocaine, magnesium hydroxide, ondansetron (ZOFRAN) IV, sodium chloride flush   Vital Signs    Vitals:   12/02/20 0348 12/02/20 0600 12/02/20 0755 12/02/20 1219  BP: (!) 129/91  132/75 130/77  Pulse: 79  70 77  Resp: 20   17  Temp: 98 F (36.7 C)  98.5 F (36.9 C) 98.3 F (36.8 C)  TempSrc:   Oral Oral  SpO2: 99%  92% 93%  Weight:  91 kg    Height:        Intake/Output Summary (Last 24 hours) at 12/02/2020 1417 Last data filed at 12/02/2020 1407 Gross per 24 hour  Intake 1080 ml  Output 2026 ml  Net -946 ml   Last 3 Weights 12/02/2020 12/01/2020 11/30/2020  Weight (lbs) 200 lb 9.9 oz 202 lb 9.6 oz 203 lb 4.8 oz  Weight (kg) 91 kg 91.9 kg 92.216 kg      Telemetry    Normal sinus rhythm- Personally Reviewed  ECG    - Personally Reviewed  Physical Exam   GEN: No acute distress.   Neck: Unable to estimate JVD Cardiac: RRR, no murmurs, rubs, or gallops.  Respiratory: Clear to auscultation bilaterally. GI: Soft, nontender,  non-distended  MS: No edema; No deformity. Neuro:  Nonfocal  Psych: Normal affect   Labs    High Sensitivity Troponin:   Recent Labs  Lab 11/25/20 1053 11/25/20 1327  TROPONINIHS 149* 134*      Chemistry Recent Labs  Lab 11/29/20 0543 11/30/20 0708 12/02/20 0451  NA 136 137 137  K 4.0 3.9 4.7  CL 102 101 108  CO2 20* 20* 22  GLUCOSE 115* 106* 121*  BUN 53* 57* 39*  CREATININE 2.75* 2.54* 1.45*  CALCIUM 8.9 9.0 8.7*  GFRNONAA 18* 20* 39*  ANIONGAP 14 16* 7     Hematology Recent Labs  Lab 11/28/20 0447 11/29/20 0543 11/30/20 0708  WBC 7.5 6.5 5.6  RBC 4.37 4.35 4.28  HGB 14.3 14.1 13.9  HCT 44.3 42.3 41.8  MCV 101.4* 97.2 97.7  MCH 32.7 32.4 32.5  MCHC 32.3 33.3 33.3  RDW 13.8 13.6 13.4  PLT 147* 148* 154    BNPNo results for input(s): BNP, PROBNP in the last 168 hours.   DDimer No results for input(s): DDIMER in the last 168 hours.   Radiology    No results found.  Cardiac Studies   Echo  1. Left ventricular ejection fraction, by estimation, is 20 to 25%. The  left ventricle has severely decreased function. The left ventricle  demonstrates global hypokinesis. The left ventricular internal cavity size  was moderately dilated. There is mild  left ventricular hypertrophy. Left ventricular diastolic parameters are  indeterminate.   2. Right ventricular systolic function is moderately reduced. The right  ventricular size is normal.   3. Left atrial size was severely dilated.   4. Right atrial size was severely dilated.   5. The mitral valve is normal in structure. Moderate to severe mitral  valve regurgitation.   6. The aortic valve was not well visualized. Aortic valve regurgitation  is mild.   Patient Profile     70 y.o. female with history of HTN and tobacco use who has not seen a physician as an outpatient in several years admitted with sudden onset of dyspnea and hypertensive emergency who we are seeing for acute HFrEF, moderate to severe  mitral regurgitation, and LBBB with worsening renal function noted with IV diuresis.   Assessment & Plan    Acute on chronic diastolic and systolic CHF   On admission, received IV Lasix x4 CT contrast, metoprolol succinate 50x2, BiDil  Subsequent hypotension , likely contributing to ATN  -Renal function has improved by numbers today -Tolerating  low-dose isosorbide and metoprolol --We will introduce low-dose ARB/losartan 25 daily, restart diuretic torsemide 20 mg daily, close monitoring of renal function daily  Cardiomyopathy   Etiology unclear, unable to exclude ischemic cardiomyopathy -Cardiac catheterization offered today, she has declined until Monday, June 28 -We will plan for right and left heart catheterization on Monday severely depressed ejection fraction EF estimated 20%  -Additional medications as detailed above    Moderate to severe MR   Secondary to LV dilation,   Repeat echocardiogram as outpatient once euvolemic on optimal medical management   Total encounter time more than 35 minutes  Greater than 50% was spent in counseling and coordination of care with the patient   For questions or updates, please contact [facility] Please consult [email or web address] for contact info under        Signed, Julien Nordmann, MD  12/02/2020, 2:17 PM

## 2020-12-02 NOTE — Progress Notes (Signed)
   12/02/20 1110  Clinical Encounter Type  Visited With Patient not available  Visit Type Initial  Referral From Other (Comment) (rounding)  Chaplain Burris attempted to visit Ms. Hink but she was sleeping soundly. Will follow-up as able or if requested.

## 2020-12-02 NOTE — Care Management Important Message (Signed)
Important Message  Patient Details  Name: Evelyn Dunn MRN: [id number] Date of Birth: [birth year]   Medicare Important Message Given:  Yes     Johnell Comings 12/02/2020, 12:34 PM

## 2020-12-02 NOTE — Progress Notes (Signed)
Complaints of left-sided rib cage pain unrelieved by anything at this time (see orders). VSS audible lung sounds bilaterally. Dr. Allena Katz updated, portable chest xray ordered.

## 2020-12-02 NOTE — Progress Notes (Signed)
[facility]  - [place] [facility]   PATIENT NAME: Evelyn Dunn    MR#:  [id number]  DATE OF BIRTH:  10/13/1950  SUBJECTIVE:  patient overall doing well. No chest pain. Slept good overnight. No new issues per RN.  REVIEW OF SYSTEMS:   Review of Systems  Constitutional:  Positive for malaise/fatigue. Negative for chills, fever and weight loss.  HENT:  Negative for ear discharge, ear pain and nosebleeds.   Eyes:  Negative for blurred vision, pain and discharge.  Respiratory:  Negative for sputum production, shortness of breath, wheezing and stridor.   Cardiovascular:  Negative for chest pain, palpitations, orthopnea and PND.  Gastrointestinal:  Negative for abdominal pain, diarrhea, nausea and vomiting.  Genitourinary:  Negative for frequency and urgency.  Musculoskeletal:  Negative for back pain and joint pain.  Neurological:  Positive for weakness. Negative for sensory change, speech change and focal weakness.  Psychiatric/Behavioral:  Negative for depression and hallucinations. The patient is not nervous/anxious.   Tolerating Diet:yes Tolerating PT: home health  DRUG ALLERGIES:  No Known Allergies  VITALS:  Blood pressure 130/77, pulse 77, temperature 98.3 F (36.8 C), temperature source Oral, resp. rate 17, height 5\' 2"  (1.575 m), weight 91 kg, SpO2 93 %.  PHYSICAL EXAMINATION:   Physical Exam  GENERAL:  70 y.o.-year-old patient lying in the bed with no acute distress.  Obese HEENT: Head atraumatic, normocephalic. Oropharynx and nasopharynx clear.  LUNGS: decreased breath sounds bilaterally, no wheezing, rales, rhonchi. No use of accessory muscles of respiration.  CARDIOVASCULAR: S1, S2 normal. No murmurs, rubs, or gallops.  ABDOMEN: Soft, nontender, nondistended. Bowel sounds present. No organomegaly or mass.  EXTREMITIES: No cyanosis, clubbing or edema b/l.    NEUROLOGIC: nonfocal PSYCHIATRIC:  patient is alert and oriented x 2 SKIN: No obvious  rash, lesion, or ulcer.   LABORATORY PANEL:  CBC Recent Labs  Lab 11/30/20 0708  WBC 5.6  HGB 13.9  HCT 41.8  PLT 154     Chemistries  Recent Labs  Lab 12/02/20 0451  NA 137  K 4.7  CL 108  CO2 22  GLUCOSE 121*  BUN 39*  CREATININE 1.45*  CALCIUM 8.7*    Cardiac Enzymes No results for input(s): TROPONINI in the last 168 hours. RADIOLOGY:  No results found. ASSESSMENT AND PLAN:   70 year old female with HTN, tobacco use, who comes to the hospital with shortness of breath.  This is been progressively getting worse over the last 4 days, worse with exertion and unable to lay flat.  Impression in the ED was acute on chronic diastolic CHF, she was placed on diuretics and was admitted to the hospital.  She was also significantly hypertensive.  Cardiology consulted as she was found to have a left bundle branch block and new onset systolic CHF.  Acute systolic CHF--new Severe cardiomyopathy EF of 20 to 25% -patient was admitted to the hospital with fluid overload in the setting of acute systolic CHF. --  A 2D echo done on 6/11 showed EF 20-25% with severely decreased LV function.  RV function was moderately reduced also.   --[facility] consulted and following-she was placed on Lasix and developed AKI, now on hold  --Cath pending improvement in her renal function -Continue metoprolol --6/17-- discussed with Dr. 7/17. He will resume cardiac meds including Diuretics and monitor creatinine over the weekend in preparation for cardiac cath on Monday   Hypertensive emergency-blood pressure now normalized  Tobacco use-cessation has been encouraged  Troponin  elevation-flat, downtrending, not in a pattern consistent with ACS.  Likely demand ischemia from severe hypertension   Acute kidney injury on chronic kidney disease stage IIIa  -creatinine is noted to be 1.3 on arrival, likely her baseline.  -- After receiving Lasix jumped to 2.5 then slightly worse at 2.7, but this  suggests plateau. -held Lasix --creat down to 1.4 (seems to be baseline)   Procedures: Family communication : left sister Corrie Dandy message  Consults : [facility] cardiology CODE STATUS: full DVT Prophylaxis : enoxaparin Level of care: Progressive Cardiac Status is: Inpatient  Remains inpatient appropriate because:Inpatient level of care appropriate due to severity of illness  Dispo: The patient is from: Home              Anticipated d/c is to: Home              Patient currently is not medically stable to d/c.   Difficult to place patient No  patient overall improving clinically. Awaiting creatinine to stabilize their after cardiac cath plan by cardiology for Monday      TOTAL TIME TAKING CARE OF THIS PATIENT: 25 minutes.  >50% time spent on counselling and coordination of care  Note: This dictation was prepared with Dragon dictation along with smaller phrase technology. Any transcriptional errors that result from this process are unintentional.  Enedina Finner M.D    [facility]   CC: Primary care physician; Pcp, No Patient ID: [name], female   DOB: Oct 22, 1950, 70 y.o.   MRN: [id number]

## 2020-12-03 ENCOUNTER — Inpatient Hospital Stay: Payer: Medicare PPO

## 2020-12-03 DIAGNOSIS — I420 Dilated cardiomyopathy: Secondary | ICD-10-CM | POA: Diagnosis not present

## 2020-12-03 LAB — BASIC METABOLIC PANEL
Anion gap: 7 (ref 5–15)
BUN: 30 mg/dL — ABNORMAL HIGH (ref 8–23)
CO2: 29 mmol/L (ref 22–32)
Calcium: 9.3 mg/dL (ref 8.9–10.3)
Chloride: 103 mmol/L (ref 98–111)
Creatinine, Ser: 1.57 mg/dL — ABNORMAL HIGH (ref 0.44–1.00)
GFR, Estimated: 35 mL/min — ABNORMAL LOW (ref >60)
Glucose, Bld: 115 mg/dL — ABNORMAL HIGH (ref 70–99)
Potassium: 4.2 mmol/L (ref 3.5–5.1)
Sodium: 139 mmol/L (ref 135–145)

## 2020-12-03 MED ORDER — SODIUM CHLORIDE 0.9% FLUSH
3.0000 mL | Freq: Two times a day (BID) | INTRAVENOUS | Status: DC
  Administered 2020-12-03 – 2020-12-06 (×6): 3 mL via INTRAVENOUS

## 2020-12-03 MED ORDER — ASPIRIN 81 MG PO CHEW
81.0000 mg | CHEWABLE_TABLET | Freq: Every day | ORAL | Status: DC
  Administered 2020-12-03 – 2020-12-09 (×6): 81 mg via ORAL
  Filled 2020-12-03 (×7): qty 1

## 2020-12-03 MED ORDER — TECHNETIUM TO 99M ALBUMIN AGGREGATED
4.0000 | Freq: Once | INTRAVENOUS | Status: AC | PRN
  Administered 2020-12-03: 3.97 via INTRAVENOUS

## 2020-12-03 NOTE — Progress Notes (Signed)
Progress Note    Evelyn Dunn  BJS:[id number] DOB: 1951-02-04  DOA: 11/25/2020 PCP: Pcp, No    Brief Narrative:     Medical records reviewed and are as summarized below:  Evelyn Dunn is an 70 y.o. female  with HTN, tobacco use, who comes to the hospital with shortness of breath.  This is been progressively getting worse over the last 4 days, worse with exertion and unable to lay flat.  Impression in the ED was acute on chronic diastolic CHF, she was placed on diuretics and was admitted to the hospital.  She was also significantly hypertensive.  Cardiology consulted as she was found to have a left bundle branch block and new onset systolic CHF.  Await Cr to be stable enough for heart cath.    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 --  A 2D echo done on 6/11 showed EF 20-25% with severely decreased LV function.  RV function was moderately reduced also.   --[facility] consulted and following-she was placed on Lasix and developed AKI, now on hold --Cath pending improvement in her renal function -Continue metoprolol --cards:  cardiac cath on Monday   Hypertensive emergency-blood pressure now normalized  Tobacco use-cessation has been encouraged  Troponin elevation-flat, downtrending, not in a pattern consistent with ACS.  Likely demand ischemia from severe hypertension   Acute kidney injury on chronic kidney disease stage IIIa -creatinine is noted to be 1.3 on arrival, likely her baseline. -- After receiving Lasix jumped to 2.5 then slightly worse at 2.7, but this suggests  plateau. -held Lasix --creat down to 1.4 (seems to be baseline)  Elevated d dimer -V/Q scan pending    obesity Body mass index is 36.73 kg/m.   Family Communication/Anticipated D/C date and plan/Code Status   DVT prophylaxis: Lovenox ordered. Code Status: Full Code.   Disposition Plan: Status is: Inpatient  Remains inpatient appropriate because:Inpatient level of care appropriate due to severity of illness  Dispo: The patient is from: Home              Anticipated d/c is to: Home              Patient currently is not medically stable to d/c.   Difficult to place patient No         Medical Consultants:   cards    Subjective:   C/o left rib pain  Objective:    Vitals:   12/03/20 0519 12/03/20 0542 12/03/20 0753 12/03/20 1201  BP: (!) 111/59  118/69 111/66  Pulse: 80  79 80  Resp: 16   19  Temp: 98.1 F (36.7 C)  98.7 F (37.1 C) 98.3 F (36.8 C)  TempSrc:      SpO2: 93%  95% 96%  Weight:  91.1 kg    Height:        Intake/Output Summary (Last 24 hours) at 12/03/2020 1215 Last data filed at 12/03/2020 1000 Gross per 24 hour  Intake 1580 ml  Output 2776 ml  Net -1196 ml   Filed Weights   12/01/20 0425 12/02/20  0600 12/03/20 0542  Weight: 91.9 kg 91 kg 91.1 kg    Exam:  General: Appearance:    Obese female in no acute distress     Lungs:     respirations unlabored  Heart:    Normal heart rate.   MS:   All extremities are intact.    Neurologic:   Awake, alert, oriented x 3.     Data Reviewed:   I have personally reviewed following labs and imaging studies:  Labs: Labs show the following:   Basic Metabolic Panel: Recent Labs  Lab 11/28/20 0447 11/29/20 0543 11/30/20 0708 12/02/20 0451 12/03/20 1014  NA 137 136 137 137 139  K 4.7 4.0 3.9 4.7 4.2  CL 105 102 101 108 103  CO2 17* 20* 20* 22 29  GLUCOSE 105* 115* 106* 121* 115*  BUN 45* 53* 57* 39* 30*  CREATININE 2.51* 2.75* 2.54* 1.45* 1.57*  CALCIUM 9.2 8.9 9.0 8.7* 9.3    GFR Estimated Creatinine Clearance: 35.5 mL/min (A) (by C-G formula based on SCr of 1.57 mg/dL (H)). Liver Function Tests: No results for input(s): AST, ALT, ALKPHOS, BILITOT, PROT, ALBUMIN in the last 168 hours. No results for input(s): LIPASE, AMYLASE in the last 168 hours. No results for input(s): AMMONIA in the last 168 hours. Coagulation profile No results for input(s): INR, PROTIME in the last 168 hours.  CBC: Recent Labs  Lab 11/27/20 0501 11/28/20 0447 11/29/20 0543 11/30/20 0708  WBC 6.1 7.5 6.5 5.6  HGB 13.3 14.3 14.1 13.9  HCT 38.9 44.3 42.3 41.8  MCV 96.0 101.4* 97.2 97.7  PLT 154 147* 148* 154   Cardiac Enzymes: No results for input(s): CKTOTAL, CKMB, CKMBINDEX, TROPONINI in the last 168 hours. BNP (last 3 results) No results for input(s): PROBNP in the last 8760 hours. CBG: No results for input(s): GLUCAP in the last 168 hours. D-Dimer: Recent Labs    12/02/20 1858  DDIMER 1.73*   Hgb A1c: No results for input(s): HGBA1C in the last 72 hours. Lipid Profile: No results for input(s): CHOL, HDL, LDLCALC, TRIG, CHOLHDL, LDLDIRECT in the last 72 hours. Thyroid function studies: No results for input(s): TSH, T4TOTAL, T3FREE, THYROIDAB in the last 72 hours.  Invalid input(s): FREET3 Anemia work up: No results for input(s): VITAMINB12, FOLATE, FERRITIN, TIBC, IRON, RETICCTPCT in the last 72 hours. Sepsis Labs: Recent Labs  Lab 11/27/20 0501 11/28/20 0447 11/29/20 0543 11/30/20 0708  WBC 6.1 7.5 6.5 5.6    Microbiology Recent Results (from the past 240 hour(s))  Resp Panel by RT-PCR (Flu A&B, Covid) Nasopharyngeal Swab     Status: None   Collection Time: 11/25/20 11:18 AM   Specimen: Nasopharyngeal Swab; Nasopharyngeal(NP) swabs in vial transport medium  Result Value Ref Range Status   SARS Coronavirus 2 by RT PCR NEGATIVE NEGATIVE Final    Comment: (NOTE) SARS-CoV-2 target nucleic acids are NOT DETECTED.  The SARS-CoV-2 RNA is generally  detectable in upper respiratory specimens during the acute phase of infection. The lowest concentration of SARS-CoV-2 viral copies this assay can detect is 138 copies/mL. A negative result does not preclude SARS-Cov-2 infection and should not be used as the sole basis for treatment or other patient management decisions. A negative result may occur with  improper specimen collection/handling, submission of specimen other than nasopharyngeal swab, presence of viral mutation(s) within the areas targeted by this assay, and inadequate number of viral copies(<138 copies/mL). A negative result must be combined with clinical observations, patient history, and epidemiological  information. The expected result is Negative.  Fact Sheet for Patients:  BloggerCourse.com  Fact Sheet for Healthcare Providers:  SeriousBroker.it  This test is no t yet approved or cleared by the [place] FDA and  has been authorized for detection and/or diagnosis of SARS-CoV-2 by FDA under an Emergency Use Authorization (EUA). This EUA will remain  in effect (meaning this test can be used) for the duration of the COVID-19 declaration under Section 564(b)(1) of the Act, 21 U.S.C.section 360bbb-3(b)(1), unless the authorization is terminated  or revoked sooner.       Influenza A by PCR NEGATIVE NEGATIVE Final   Influenza B by PCR NEGATIVE NEGATIVE Final    Comment: (NOTE) The Xpert Xpress SARS-CoV-2/FLU/RSV plus assay is intended as an aid in the diagnosis of influenza from Nasopharyngeal swab specimens and should not be used as a sole basis for treatment. Nasal washings and aspirates are unacceptable for Xpert Xpress SARS-CoV-2/FLU/RSV testing.  Fact Sheet for Patients: BloggerCourse.com  Fact Sheet for Healthcare Providers: SeriousBroker.it  This test is not yet approved or cleared by the [place] FDA  and has been authorized for detection and/or diagnosis of SARS-CoV-2 by FDA under an Emergency Use Authorization (EUA). This EUA will remain in effect (meaning this test can be used) for the duration of the COVID-19 declaration under Section 564(b)(1) of the Act, 21 U.S.C. section 360bbb-3(b)(1), unless the authorization is terminated or revoked.  Performed at [facility], [address] [place], [place] [address]     Procedures and diagnostic studies:  DG Abd Portable 1V  Result Date: 12/02/2020 CLINICAL DATA:  Abdominal pain and distension EXAM: PORTABLE ABDOMEN - 1 VIEW COMPARISON:  None. FINDINGS: Scattered large and small bowel gas is noted. No obstructive changes are seen. No free air is noted. Degenerative changes of lumbar spine are seen. IMPRESSION: No acute abnormality to correspond with the given clinical history. Electronically Signed   By: Alcide Clever M.D.   On: 12/02/2020 18:32    Medications:    aspirin  81 mg Oral Daily   atorvastatin  40 mg Oral Daily   diclofenac Sodium  2 g Topical TID AC & HS   enoxaparin (LOVENOX) injection  1 mg/kg Subcutaneous Q12H   isosorbide-hydrALAZINE  0.5 tablet Oral TID   losartan  25 mg Oral Daily   melatonin  5 mg Oral QHS   metoprolol succinate  25 mg Oral Daily   milk and molasses  1 enema Rectal Once   nystatin cream   Topical BID   senna-docusate  2 tablet Oral QHS   sodium chloride flush  3 mL Intravenous Q12H   sodium chloride flush  3 mL Intravenous Q12H   torsemide  20 mg Oral Daily   Continuous Infusions:  sodium chloride       LOS: 8 days   Joseph Art  [facility]   How to contact the [facility] Attending or Consulting provider 7A - 7P or covering provider during after hours 7P -7A, for this patient?  Check the care team in [facility] and look for a) attending/consulting [facility] provider listed and b) the [facility] team listed Log into [email or web address] and use [place]'s universal password to access. If you do  not have the password, please contact the hospital operator. Locate the [facility] provider you are looking for under [facility] and page to a number that you can be directly reached. If you still have difficulty reaching the provider, please page the [facility] (Director  on Call) for the Hospitalists listed on amion for assistance.  12/03/2020, 12:15 PM

## 2020-12-03 NOTE — Progress Notes (Signed)
Progress Note  Patient Name: Evelyn Dunn Date of Encounter: 12/03/2020  Primary Cardiologist: Yvonne Kendall, MD  Subjective   Feels that breathing cont to improve.  Coughing some and w/ that, has had left sided rib pain.  The area is also tender to touch.  Inpatient Medications    Scheduled Meds:  aspirin  81 mg Oral Daily   atorvastatin  40 mg Oral Daily   diclofenac Sodium  2 g Topical TID AC & HS   enoxaparin (LOVENOX) injection  1 mg/kg Subcutaneous Q12H   isosorbide-hydrALAZINE  0.5 tablet Oral TID   losartan  25 mg Oral Daily   melatonin  5 mg Oral QHS   metoprolol succinate  25 mg Oral Daily   milk and molasses  1 enema Rectal Once   nystatin cream   Topical BID   senna-docusate  2 tablet Oral QHS   sodium chloride flush  3 mL Intravenous Q12H   sodium chloride flush  3 mL Intravenous Q12H   torsemide  20 mg Oral Daily   Continuous Infusions:  sodium chloride     PRN Meds: sodium chloride, acetaminophen, alum & mag hydroxide-simeth, benzonatate, HYDROcodone-acetaminophen, hydrOXYzine, lidocaine, magnesium hydroxide, ondansetron (ZOFRAN) IV, sodium chloride flush   Vital Signs    Vitals:   12/02/20 2129 12/03/20 0519 12/03/20 0542 12/03/20 0753  BP: 119/71 (!) 111/59  118/69  Pulse: 79 80  79  Resp:  16    Temp: 98.4 F (36.9 C) 98.1 F (36.7 C)  98.7 F (37.1 C)  TempSrc:      SpO2: 100% 93%  95%  Weight:   91.1 kg   Height:        Intake/Output Summary (Last 24 hours) at 12/03/2020 1029 Last data filed at 12/03/2020 0430 Gross per 24 hour  Intake 1340 ml  Output 2776 ml  Net -1436 ml   Filed Weights   12/01/20 0425 12/02/20 0600 12/03/20 0542  Weight: 91.9 kg 91 kg 91.1 kg    Physical Exam   GEN: Well nourished, well developed, in no acute distress.  HEENT: Grossly normal.  Neck: Supple, obese, difficult to gauge JVP.  No carotid bruits, or masses. Cardiac: RRR, no murmurs, rubs, or gallops. No clubbing, cyanosis, edema.  Radials 2+,  DP/PT 2+ and equal bilaterally.  Respiratory:  Respirations regular and unlabored, diffuse, mild insp/exp wheezing. GI: Soft, nontender, nondistended, BS + x 4. MS: no deformity or atrophy. Skin: warm and dry, no rash. Neuro:  Strength and sensation are intact. Psych: AAOx3.  Normal affect.  Labs    Chemistry Recent Labs  Lab 11/29/20 0543 11/30/20 0708 12/02/20 0451  NA 136 137 137  K 4.0 3.9 4.7  CL 102 101 108  CO2 20* 20* 22  GLUCOSE 115* 106* 121*  BUN 53* 57* 39*  CREATININE 2.75* 2.54* 1.45*  CALCIUM 8.9 9.0 8.7*  GFRNONAA 18* 20* 39*  ANIONGAP 14 16* 7     Hematology Recent Labs  Lab 11/28/20 0447 11/29/20 0543 11/30/20 0708  WBC 7.5 6.5 5.6  RBC 4.37 4.35 4.28  HGB 14.3 14.1 13.9  HCT 44.3 42.3 41.8  MCV 101.4* 97.2 97.7  MCH 32.7 32.4 32.5  MCHC 32.3 33.3 33.3  RDW 13.8 13.6 13.4  PLT 147* 148* 154    Cardiac Enzymes  Recent Labs  Lab 11/25/20 1053 11/25/20 1327  TROPONINIHS 149* 134*      DDimer  Recent Labs  Lab 12/02/20 1858  DDIMER 1.73*  Lipids  Lab Results  Component Value Date   CHOL 162 11/25/2020   HDL 41 11/25/2020   LDLCALC 101 (H) 11/25/2020   TRIG 98 11/25/2020   CHOLHDL 4.0 11/25/2020    HbA1c  Lab Results  Component Value Date   HGBA1C 6.4 (H) 11/25/2020    Radiology    DG Abd Portable 1V  Result Date: 12/02/2020 CLINICAL DATA:  Abdominal pain and distension EXAM: PORTABLE ABDOMEN - 1 VIEW COMPARISON:  None. FINDINGS: Scattered large and small bowel gas is noted. No obstructive changes are seen. No free air is noted. Degenerative changes of lumbar spine are seen. IMPRESSION: No acute abnormality to correspond with the given clinical history. Electronically Signed   By: Alcide Clever M.D.   On: 12/02/2020 18:32    Telemetry    RSR - Personally Reviewed  Cardiac Studies   2D Echocardiogram 6.11.2022   1. Left ventricular ejection fraction, by estimation, is 20 to 25%. The  left ventricle has severely  decreased function. The left ventricle  demonstrates global hypokinesis. The left ventricular internal cavity size  was moderately dilated. There is mild  left ventricular hypertrophy. Left ventricular diastolic parameters are  indeterminate.   2. Right ventricular systolic function is moderately reduced. The right  ventricular size is normal.   3. Left atrial size was severely dilated.   4. Right atrial size was severely dilated.   5. The mitral valve is normal in structure. Moderate to severe mitral  valve regurgitation.   6. The aortic valve was not well visualized. Aortic valve regurgitation  is mild.  Patient Profile     70 y.o. female with history of HTN and tobacco use who has not seen a physician as an outpatient in several years admitted with sudden onset of dyspnea and hypertensive emergency on 11/25/2020, who we are seeing for acute HFrEF, moderate to severe mitral regurgitation, and LBBB with worsening renal function noted with IV diuresis.   Assessment & Plan   1. Acute on chronic HFrEF/Cardiomyopathy:  Admitted 6/10 w/ dyspnea and hypertensive urgency.  Found to be markedly volume overloaded.  Initially received IV lasix x 4, CT contrast, BiDil, and metoprolol and developed hypotension w/ subsequent rise in creat to 2.75 on 6/14.  Creat 1.45 on 6/17 and pending this AM.  Oral torsemide added back yesterday.  Minus 1.4L yesterday.  Wt stable.  She feels that breathing is improving.  Body habitus makes exam challenging.  Will cont oral torsemide today, while we await f/u labs. Cont ? blocker, low dose ARB, and bidil.  Provided creat is stable, plan for R and L heart cath on Monday.  The patient understands that risks include but are not limited to stroke (1 in 1000), death (1 in 1000), kidney failure [usually temporary] (1 in 500), bleeding (1 in 200), allergic reaction [possibly serious] (1 in 200), and agrees to proceed.    2.  Moderate to severe MR: In setting of LV dysfxn and  volume overload.  R & L heart cath Monday as above.  3.  AKI:  in setting of hypotension, diuresis, contrast.  Creat peaked @ 2.75 on 6/14.  Improved to 1.45 on 6/17.  Pending this AM.  Torsemide and losartan added back 6/17.  4.  Hypertensive emergency: BP stable on ? blocker, ARB, and bidil.  5.  Tob Abuse:  Cessation advised.  6.  Demand Ischemia:  Mild HsTrop elev in the setting of above (149  13).  No chest pain.  Eventual cath.  Cor Ca2+ noted on CTA earlier this admission.  Cont ? blocker and statin.  Will add asa.  7.  L sided rib pain:  in setting of coughing.  The area is tender.  CTA earlier this admission neg for PE.  Signed, Nicolasa Ducking, NP  12/03/2020, 10:29 AM    For questions or updates, please contact   Please consult [email or web address] for contact info under Cardiology/STEMI.

## 2020-12-04 DIAGNOSIS — I160 Hypertensive urgency: Secondary | ICD-10-CM | POA: Diagnosis not present

## 2020-12-04 DIAGNOSIS — I420 Dilated cardiomyopathy: Secondary | ICD-10-CM | POA: Diagnosis not present

## 2020-12-04 LAB — CBC
HCT: 42.3 % (ref 36.0–46.0)
Hemoglobin: 14.1 g/dL (ref 12.0–15.0)
MCH: 31.9 pg (ref 26.0–34.0)
MCHC: 33.3 g/dL (ref 30.0–36.0)
MCV: 95.7 fL (ref 80.0–100.0)
Platelets: 193 10*3/uL (ref 150–400)
RBC: 4.42 MIL/uL (ref 3.87–5.11)
RDW: 13.8 % (ref 11.5–15.5)
WBC: 6.9 10*3/uL (ref 4.0–10.5)
nRBC: 0 % (ref 0.0–0.2)

## 2020-12-04 LAB — BASIC METABOLIC PANEL
Anion gap: 9 (ref 5–15)
BUN: 30 mg/dL — ABNORMAL HIGH (ref 8–23)
CO2: 25 mmol/L (ref 22–32)
Calcium: 8.7 mg/dL — ABNORMAL LOW (ref 8.9–10.3)
Chloride: 105 mmol/L (ref 98–111)
Creatinine, Ser: 1.69 mg/dL — ABNORMAL HIGH (ref 0.44–1.00)
GFR, Estimated: 32 mL/min — ABNORMAL LOW (ref >60)
Glucose, Bld: 106 mg/dL — ABNORMAL HIGH (ref 70–99)
Potassium: 3.9 mmol/L (ref 3.5–5.1)
Sodium: 139 mmol/L (ref 135–145)

## 2020-12-04 NOTE — H&P (View-Only) (Signed)
Progress Note  Patient Name: Evelyn Dunn Date of Encounter: 12/04/2020  Primary Cardiologist: Yvonne Kendall, MD  Subjective   Feels that breathing is back to baseline.  She notes that she has chronic orthopnea, which persists.  No chest pain.  BUN/Creat elevated each of the past two mornings.  Both losartan and torsemide held today.  Inpatient Medications    Scheduled Meds:  aspirin  81 mg Oral Daily   atorvastatin  40 mg Oral Daily   diclofenac Sodium  2 g Topical TID AC & HS   enoxaparin (LOVENOX) injection  1 mg/kg Subcutaneous Q12H   isosorbide-hydrALAZINE  0.5 tablet Oral TID   melatonin  5 mg Oral QHS   metoprolol succinate  25 mg Oral Daily   milk and molasses  1 enema Rectal Once   nystatin cream   Topical BID   senna-docusate  2 tablet Oral QHS   sodium chloride flush  3 mL Intravenous Q12H   sodium chloride flush  3 mL Intravenous Q12H   Continuous Infusions:  sodium chloride     PRN Meds: sodium chloride, acetaminophen, alum & mag hydroxide-simeth, benzonatate, HYDROcodone-acetaminophen, hydrOXYzine, lidocaine, magnesium hydroxide, ondansetron (ZOFRAN) IV, sodium chloride flush   Vital Signs    Vitals:   12/03/20 1436 12/03/20 1950 12/04/20 0456 12/04/20 0815  BP: 102/64 130/86 (!) 141/73 134/78  Pulse: 82 84 80 80  Resp: 18 18 16 18   Temp: 97.8 F (36.6 C) 98.2 F (36.8 C) 98.4 F (36.9 C) 98 F (36.7 C)  TempSrc:    Oral  SpO2: 94% 96% 92% 97%  Weight:      Height:        Intake/Output Summary (Last 24 hours) at 12/04/2020 1130 Last data filed at 12/04/2020 0930 Gross per 24 hour  Intake 960 ml  Output 200 ml  Net 760 ml   Filed Weights   12/01/20 0425 12/02/20 0600 12/03/20 0542  Weight: 91.9 kg 91 kg 91.1 kg    Physical Exam   GEN: Obese, in no acute distress.  HEENT: Grossly normal.  Neck: Supple, obese, difficult to gauge JVP.  No carotid bruits, or masses. Cardiac: RRR, I do not appreciate any murmurs.  No rubs, or gallops.  No clubbing, cyanosis, edema.  Radials 2+, DP/PT 2+ and equal bilaterally.  Respiratory:  Respirations regular and unlabored, clear to auscultation bilaterally. GI: Obese, soft, nontender, nondistended, BS + x 4. MS: no deformity or atrophy. Skin: warm and dry, no rash. Neuro:  Strength and sensation are intact. Psych: AAOx3.  Normal affect.  Labs    Chemistry Recent Labs  Lab 12/02/20 0451 12/03/20 1014 12/04/20 0619  NA 137 139 139  K 4.7 4.2 3.9  CL 108 103 105  CO2 22 29 25   GLUCOSE 121* 115* 106*  BUN 39* 30* 30*  CREATININE 1.45* 1.57* 1.69*  CALCIUM 8.7* 9.3 8.7*  GFRNONAA 39* 35* 32*  ANIONGAP 7 7 9      Hematology Recent Labs  Lab 11/29/20 0543 11/30/20 0708 12/04/20 0619  WBC 6.5 5.6 6.9  RBC 4.35 4.28 4.42  HGB 14.1 13.9 14.1  HCT 42.3 41.8 42.3  MCV 97.2 97.7 95.7  MCH 32.4 32.5 31.9  MCHC 33.3 33.3 33.3  RDW 13.6 13.4 13.8  PLT 148* 154 193    Cardiac Enzymes  Recent Labs  Lab 11/25/20 1053 11/25/20 1327  TROPONINIHS 149* 134*     DDimer  Recent Labs  Lab 12/02/20 1858  DDIMER 1.73*  Lipids  Lab Results  Component Value Date   CHOL 162 11/25/2020   HDL 41 11/25/2020   LDLCALC 101 (H) 11/25/2020   TRIG 98 11/25/2020   CHOLHDL 4.0 11/25/2020    HbA1c  Lab Results  Component Value Date   HGBA1C 6.4 (H) 11/25/2020    Radiology    DG Chest 2 View  Result Date: 12/03/2020 CLINICAL DATA:  Shortness of breath EXAM: CHEST - 2 VIEW COMPARISON:  11/25/2020 FINDINGS: Cardiac shadow remains enlarged. Patient rotation to the right is noted further accentuating the mediastinal markings. Bibasilar airspace opacity is noted left slightly greater than right new from the prior exam. No bony abnormality is noted. IMPRESSION: Increasing bibasilar airspace opacities left greater than right. Electronically Signed   By: Mark  Lukens M.D.   On: 12/03/2020 15:29   NM Pulmonary Perfusion  Addendum Date: 12/03/2020   ADDENDUM REPORT: 12/03/2020  15:47 ADDENDUM: This addendum was made after review of a chest radiograph and lower extremity venous duplex on 12/03/2020. There is volume loss in the right lung and there are patchy densities at the left lung base that could be contributing to the perfusion abnormalities. Based on these imaging findings, pulmonary embolism cannot be excluded but the study is equivocal. Findings discussed with Dr. Vann on 12/03/2020 at 1540 hours. Electronically Signed   By: Adam  Henn M.D.   On: 12/03/2020 15:47   Addendum Date: 12/03/2020   ADDENDUM REPORT: 12/03/2020 13:23 ADDENDUM: These results were called by telephone at the time of interpretation on 12/03/2020 at 1:22 pm to provider Dr. Vann, who verbally acknowledged these results. Electronically Signed   By: Adam  Henn M.D.   On: 12/03/2020 13:23   Result Date: 12/03/2020 CLINICAL DATA:  69-year-old with suspected pulmonary embolism. Shortness of breath for 5 days. Chest radiograph and chest CT on 11/25/2020. EXAM: NUCLEAR MEDICINE PERFUSION LUNG SCAN TECHNIQUE: Perfusion images were obtained in multiple projections after intravenous injection of radiopharmaceutical. Ventilation scans intentionally deferred if perfusion scan and chest x-ray adequate for interpretation during COVID 19 epidemic. RADIOPHARMACEUTICALS:  3.97 mCi Tc-99m MAA IV COMPARISON:  Chest CT and chest radiograph 11/25/2020 FINDINGS: Abnormal perfusion in the right lung particularly along the anterior aspect of the mid and lower right lung. Evidence for a large wedge-shaped defect involving the anterior left lower lung. Most recent chest radiograph was on 11/25/2020. IMPRESSION: Abnormal perfusion in both lungs. Findings are suggestive for pulmonary embolism. Recommend updated chest radiograph to exclude significant lung abnormalities. Electronically Signed: By: Adam  Henn M.D. On: 12/03/2020 13:18   US Venous Img Lower Bilateral (DVT)  Result Date: 12/03/2020 CLINICAL DATA:  69-year-old with  abnormal perfusion on nuclear medicine examination. Findings raise concern for pulmonary embolism. Evaluate for lower extremity DVT. EXAM: BILATERAL LOWER EXTREMITY VENOUS DOPPLER ULTRASOUND TECHNIQUE: Gray-scale sonography with graded compression, as well as color Doppler and duplex ultrasound were performed to evaluate the lower extremity deep venous systems from the level of the common femoral vein and including the common femoral, femoral, profunda femoral, popliteal and calf veins including the posterior tibial, peroneal and gastrocnemius veins when visible. The superficial great saphenous vein was also interrogated. Spectral Doppler was utilized to evaluate flow at rest and with distal augmentation maneuvers in the common femoral, femoral and popliteal veins. COMPARISON:  None. FINDINGS: RIGHT LOWER EXTREMITY Common Femoral Vein: No evidence of thrombus. Normal compressibility, respiratory phasicity and response to augmentation. Saphenofemoral Junction: No evidence of thrombus. Normal compressibility and flow on color Doppler imaging. Profunda Femoral   Vein: No evidence of thrombus. Normal compressibility and flow on color Doppler imaging. Femoral Vein: No evidence of thrombus. Normal compressibility, respiratory phasicity and response to augmentation. Popliteal Vein: No evidence of thrombus. Normal compressibility, respiratory phasicity and response to augmentation. Calf Veins: No evidence of thrombus. Normal compressibility and flow on color Doppler imaging. Other Findings:  None. LEFT LOWER EXTREMITY Common Femoral Vein: No evidence of thrombus. Normal compressibility, respiratory phasicity and response to augmentation. Saphenofemoral Junction: No evidence of thrombus. Normal compressibility and flow on color Doppler imaging. Profunda Femoral Vein: No evidence of thrombus. Normal compressibility and flow on color Doppler imaging. Femoral Vein: No evidence of thrombus. Normal compressibility, respiratory  phasicity and response to augmentation. Popliteal Vein: No evidence of thrombus. Normal compressibility, respiratory phasicity and response to augmentation. Calf Veins: No evidence of thrombus. Normal compressibility and flow on color Doppler imaging. Other Findings:  None. IMPRESSION: No evidence of deep venous thrombosis in either lower extremity. Electronically Signed   By: Richarda Overlie M.D.   On: 12/03/2020 15:33   DG Abd Portable 1V  Result Date: 12/02/2020 CLINICAL DATA:  Abdominal pain and distension EXAM: PORTABLE ABDOMEN - 1 VIEW COMPARISON:  None. FINDINGS: Scattered large and small bowel gas is noted. No obstructive changes are seen. No free air is noted. Degenerative changes of lumbar spine are seen. IMPRESSION: No acute abnormality to correspond with the given clinical history. Electronically Signed   By: Alcide Clever M.D.   On: 12/02/2020 18:32    Telemetry    RSR - Personally Reviewed  Cardiac Studies   2D Echocardiogram 6.11.2022    1. Left ventricular ejection fraction, by estimation, is 20 to 25%. The  left ventricle has severely decreased function. The left ventricle  demonstrates global hypokinesis. The left ventricular internal cavity size  was moderately dilated. There is mild  left ventricular hypertrophy. Left ventricular diastolic parameters are  indeterminate.   2. Right ventricular systolic function is moderately reduced. The right  ventricular size is normal.   3. Left atrial size was severely dilated.   4. Right atrial size was severely dilated.   5. The mitral valve is normal in structure. Moderate to severe mitral  valve regurgitation.   6. The aortic valve was not well visualized. Aortic valve regurgitation  is mild.  Patient Profile     69 y.o. female with history of HTN and tobacco use who has not seen a physician as an outpatient in several years admitted with sudden onset of dyspnea and hypertensive emergency on 11/25/2020, who we are seeing for acute  HFrEF, moderate to severe mitral regurgitation, and LBBB with worsening renal function noted with IV diuresis.   Assessment & Plan    1.  Acute HFrEF/Cardiomyopathy:  Admitted 6/10 w/ dyspnea and hypertensive urgency.  Found to be markedly volume overloaded.  Initially received IV lasix x 4, CT contrast, BiDil, and metoprolol and developed hypotension w/ subsequent rise in creat to 2.75 on 6/14.  Creat 1.45 on 6/17.  Creat rising over past 2 days (1.45  1.57  1.69) since resuming oral torsemide and losartan  will hold both given plan for R and L heart cath.  + 760 ml on 6/18, though only 200 ml of UO recorded, and she says she went much more than that.   Exam challenging due to body habitus, though lungs are clear, abd soft, and no lower ext edema.  ReDS vest not currently available.  Cont ? blocker and bidil.  Plan cath  for tomorrow pending renal fxn.  The patient understands that risks include but are not limited to stroke (1 in 1000), death (1 in 1000), kidney failure [usually temporary] (1 in 500), bleeding (1 in 200), allergic reaction [possibly serious] (1 in 200), and agrees to proceed.    2.  Moderate to severe MR:  in setting of LV dysfxn and volume overload.  I do not appreciate a murmur on exam.  R&L heart cath tomorrow.  3.  AKI:  in setting of hypotension, diuresis, contrast.  As above, Creat rising again over past two days. Torsemide and losartan on hold.  F/u in AM.  4.  Hypertensive emergency:  BP has been relatively stable.  Follow off of ARB and torsemide.  5.  Tob abuse:  cessation advised.  6.  Demand Ischemia:  Mild HsTrop elev in setting of above (149  13).  No chest pain.  Cath tomorrow.  Cor Ca2+ noted on CTA earlier this admission.  Cont ? blocker, asa, and statin.  7.  L sided rib pain:  in setting of coughing.  Area is tender.  CTA earlier this admissinon for PE.  V:Q scan equivocal.  Likely MSK.  Signed, Nicolasa Ducking, NP  12/04/2020, 11:30 AM    For questions or  updates, please contact   Please consult [email or web address] for contact info under Cardiology/STEMI.

## 2020-12-04 NOTE — Progress Notes (Signed)
Progress Note    Evelyn Dunn  PJP:[id number] DOB: Apr 21, 1951  DOA: 11/25/2020 PCP: Pcp, No    Brief Narrative:     Medical records reviewed and are as summarized below:  Evelyn Dunn is an 70 y.o. female  with HTN, tobacco use, who comes to the hospital with shortness of breath.  This is been progressively getting worse over the last 4 days, worse with exertion and unable to lay flat.  Impression in the ED was acute on chronic diastolic CHF, she was placed on diuretics and was admitted to the hospital.  She was also significantly hypertensive.  Cardiology consulted as she was found to have a left bundle branch block and new onset systolic CHF.  Await Cr to be stable enough for heart cath.    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 --  A 2D echo done on 6/11 showed EF 20-25% with severely decreased LV function.  RV function was moderately reduced also.   --[facility] Cardiology consulted and following-she was placed on Lasix and developed AKI, now on hold --Cath pending improvement in her renal function -Continue metoprolol --cards:  cardiac cath on Monday   Hypertensive emergency-blood pressure now normalized  Tobacco use-cessation has been encouraged  Troponin elevation-flat, downtrending, not in a pattern consistent with ACS.  Likely demand ischemia from severe hypertension   Acute kidney injury on chronic kidney disease stage IIIa -creatinine is noted to be 1.3 on arrival, likely her baseline. -- After receiving Lasix jumped to 2.5 then slightly worse at 2.7, but this suggests  plateau. -held Lasix --creat down to 1.4 (seems to be baseline)  Elevated d dimer -V/Q scan unclear (x ray with b/l opacities l>r) -duplex LE negative -doubt PE- recent CTA negative for PE   obesity Body mass index is 36.73 kg/m.   Family Communication/Anticipated D/C date and plan/Code Status   DVT prophylaxis: Lovenox ordered. Code Status: Full Code.   Disposition Plan: Status is: Inpatient  Remains inpatient appropriate because:Inpatient level of care appropriate due to severity of illness  Dispo: The patient is from: Home              Anticipated d/c is to: Home              Patient currently is not medically stable to d/c.   Difficult to place patient No         Medical Consultants:   cards    Subjective:   Rib pain slightly better  Objective:    Vitals:   12/03/20 1436 12/03/20 1950 12/04/20 0456 12/04/20 0815  BP: 102/64 130/86 (!) 141/73 134/78  Pulse: 82 84 80 80  Resp: 18 18 16 18   Temp: 97.8 F (36.6 C) 98.2 F (36.8 C) 98.4 F (36.9 C) 98 F (36.7 C)  TempSrc:    Oral  SpO2: 94% 96% 92% 97%  Weight:      Height:        Intake/Output Summary (Last 24 hours) at 12/04/2020 1055 Last data filed at 12/04/2020 0930 Gross per 24 hour  Intake 960 ml  Output 200 ml  Net 760 ml   Filed Weights   12/01/20 0425 12/02/20 0600 12/03/20 0542  Weight: 91.9 kg 91 kg 91.1 kg    Exam:   General: Appearance:    Obese female in no acute distress     Lungs:     respirations unlabored  Heart:    Normal heart rate.   MS:   All extremities are intact.    Neurologic:   Awake, alert, oriented x 3       Data Reviewed:   I have personally reviewed following labs and imaging studies:  Labs: Labs show the following:   Basic Metabolic Panel: Recent Labs  Lab 11/29/20 0543 11/30/20 0708 12/02/20 0451 12/03/20 1014 12/04/20 0619  NA 136 137 137 139 139  K 4.0 3.9 4.7 4.2 3.9  CL 102 101 108 103 105  CO2 20* 20* 22 29 25   GLUCOSE 115*  106* 121* 115* 106*  BUN 53* 57* 39* 30* 30*  CREATININE 2.75* 2.54* 1.45* 1.57* 1.69*  CALCIUM 8.9 9.0 8.7* 9.3 8.7*   GFR Estimated Creatinine Clearance: 33 mL/min (A) (by C-G formula based on SCr of 1.69 mg/dL (H)). Liver Function Tests: No results for input(s): AST, ALT, ALKPHOS, BILITOT, PROT, ALBUMIN in the last 168 hours. No results for input(s): LIPASE, AMYLASE in the last 168 hours. No results for input(s): AMMONIA in the last 168 hours. Coagulation profile No results for input(s): INR, PROTIME in the last 168 hours.  CBC: Recent Labs  Lab 11/28/20 0447 11/29/20 0543 11/30/20 0708 12/04/20 0619  WBC 7.5 6.5 5.6 6.9  HGB 14.3 14.1 13.9 14.1  HCT 44.3 42.3 41.8 42.3  MCV 101.4* 97.2 97.7 95.7  PLT 147* 148* 154 193   Cardiac Enzymes: No results for input(s): CKTOTAL, CKMB, CKMBINDEX, TROPONINI in the last 168 hours. BNP (last 3 results) No results for input(s): PROBNP in the last 8760 hours. CBG: No results for input(s): GLUCAP in the last 168 hours. D-Dimer: Recent Labs    12/02/20 1858  DDIMER 1.73*   Hgb A1c: No results for input(s): HGBA1C in the last 72 hours. Lipid Profile: No results for input(s): CHOL, HDL, LDLCALC, TRIG, CHOLHDL, LDLDIRECT in the last 72 hours. Thyroid function studies: No results for input(s): TSH, T4TOTAL, T3FREE, THYROIDAB in the last 72 hours.  Invalid input(s): FREET3 Anemia work up: No results for input(s): VITAMINB12, FOLATE, FERRITIN, TIBC, IRON, RETICCTPCT in the last 72 hours. Sepsis Labs: Recent Labs  Lab 11/28/20 0447 11/29/20 0543 11/30/20 0708 12/04/20 0619  WBC 7.5 6.5 5.6 6.9    Microbiology Recent Results (from the past 240 hour(s))  Resp Panel by RT-PCR (Flu A&B, Covid) Nasopharyngeal Swab     Status: None   Collection Time: 11/25/20 11:18 AM   Specimen: Nasopharyngeal Swab; Nasopharyngeal(NP) swabs in vial transport medium  Result Value Ref Range Status   SARS Coronavirus 2 by RT PCR NEGATIVE NEGATIVE  Final    Comment: (NOTE) SARS-CoV-2 target nucleic acids are NOT DETECTED.  The SARS-CoV-2 RNA is generally detectable in upper respiratory specimens during the acute phase of infection. The lowest concentration of SARS-CoV-2 viral copies this assay can detect is 138 copies/mL. A negative result does not preclude SARS-Cov-2 infection and should not be used as the sole basis for treatment or other patient management decisions. A negative result may occur with  improper specimen collection/handling, submission of specimen other than nasopharyngeal swab, presence of viral mutation(s) within the areas targeted by this assay, and  inadequate number of viral copies(<138 copies/mL). A negative result must be combined with clinical observations, patient history, and epidemiological information. The expected result is Negative.  Fact Sheet for Patients:  BloggerCourse.com  Fact Sheet for Healthcare Providers:  SeriousBroker.it  This test is no t yet approved or cleared by the [place] FDA and  has been authorized for detection and/or diagnosis of SARS-CoV-2 by FDA under an Emergency Use Authorization (EUA). This EUA will remain  in effect (meaning this test can be used) for the duration of the COVID-19 declaration under Section 564(b)(1) of the Act, 21 U.S.C.section 360bbb-3(b)(1), unless the authorization is terminated  or revoked sooner.       Influenza A by PCR NEGATIVE NEGATIVE Final   Influenza B by PCR NEGATIVE NEGATIVE Final    Comment: (NOTE) The Xpert Xpress SARS-CoV-2/FLU/RSV plus assay is intended as an aid in the diagnosis of influenza from Nasopharyngeal swab specimens and should not be used as a sole basis for treatment. Nasal washings and aspirates are unacceptable for Xpert Xpress SARS-CoV-2/FLU/RSV testing.  Fact Sheet for Patients: BloggerCourse.com  Fact Sheet for Healthcare  Providers: SeriousBroker.it  This test is not yet approved or cleared by the [place] FDA and has been authorized for detection and/or diagnosis of SARS-CoV-2 by FDA under an Emergency Use Authorization (EUA). This EUA will remain in effect (meaning this test can be used) for the duration of the COVID-19 declaration under Section 564(b)(1) of the Act, 21 U.S.C. section 360bbb-3(b)(1), unless the authorization is terminated or revoked.  Performed at [facility], [address] [place], [place] [address]     Procedures and diagnostic studies:  DG Chest 2 View  Result Date: 12/03/2020 CLINICAL DATA:  Shortness of breath EXAM: CHEST - 2 VIEW COMPARISON:  11/25/2020 FINDINGS: Cardiac shadow remains enlarged. Patient rotation to the right is noted further accentuating the mediastinal markings. Bibasilar airspace opacity is noted left slightly greater than right new from the prior exam. No bony abnormality is noted. IMPRESSION: Increasing bibasilar airspace opacities left greater than right. Electronically Signed   By: Alcide Clever M.D.   On: 12/03/2020 15:29   NM Pulmonary Perfusion  Addendum Date: 12/03/2020   ADDENDUM REPORT: 12/03/2020 15:47 ADDENDUM: This addendum was made after review of a chest radiograph and lower extremity venous duplex on 12/03/2020. There is volume loss in the right lung and there are patchy densities at the left lung base that could be contributing to the perfusion abnormalities. Based on these imaging findings, pulmonary embolism cannot be excluded but the study is equivocal. Findings discussed with Dr. Benjamine Mola on 12/03/2020 at 1540 hours. Electronically Signed   By: Richarda Overlie M.D.   On: 12/03/2020 15:47   Addendum Date: 12/03/2020   ADDENDUM REPORT: 12/03/2020 13:23 ADDENDUM: These results were called by telephone at the time of interpretation on 12/03/2020 at 1:22 pm to provider Dr. Benjamine Mola, who verbally acknowledged these  results. Electronically Signed   By: Richarda Overlie M.D.   On: 12/03/2020 13:23   Result Date: 12/03/2020 CLINICAL DATA:  70 year old with suspected pulmonary embolism. Shortness of breath for 5 days. Chest radiograph and chest CT on 11/25/2020. EXAM: NUCLEAR MEDICINE PERFUSION LUNG SCAN TECHNIQUE: Perfusion images were obtained in multiple projections after intravenous injection of radiopharmaceutical. Ventilation scans intentionally deferred if perfusion scan and chest x-ray adequate for interpretation during COVID 19 epidemic. RADIOPHARMACEUTICALS:  3.97 mCi Tc-29m MAA IV COMPARISON:  Chest CT and chest radiograph 11/25/2020 FINDINGS: Abnormal perfusion in the right  lung particularly along the anterior aspect of the mid and lower right lung. Evidence for a large wedge-shaped defect involving the anterior left lower lung. Most recent chest radiograph was on 11/25/2020. IMPRESSION: Abnormal perfusion in both lungs. Findings are suggestive for pulmonary embolism. Recommend updated chest radiograph to exclude significant lung abnormalities. Electronically Signed: By: Richarda OverlieAdam  Henn M.D. On: 12/03/2020 13:18   US Venous Img Lower Bilateral (DVT)  Result Date: 12/03/2020 CLINICAL DATA:  70 year old with abnormal perfusion on nuclear medicine examination. Findings raise concern for pulmonary embolism. Evaluate for lower extremity DVT. EXAM: BILATERAL LOWER EXTREMITY VENOUS DOPPLER ULTRASOUND TECHNIQUE: Gray-scale sonography with graded compression, as well as color Doppler and duplex ultrasound were performed to evaluate the lower extremity deep venous systems from the level of the common femoral vein and including the common femoral, femoral, profunda femoral, popliteal and calf veins including the posterior tibial, peroneal and gastrocnemius veins when visible. The superficial great saphenous vein was also interrogated. Spectral Doppler was utilized to evaluate flow at rest and with distal augmentation maneuvers in the  common femoral, femoral and popliteal veins. COMPARISON:  None. FINDINGS: RIGHT LOWER EXTREMITY Common Femoral Vein: No evidence of thrombus. Normal compressibility, respiratory phasicity and response to augmentation. Saphenofemoral Junction: No evidence of thrombus. Normal compressibility and flow on color Doppler imaging. Profunda Femoral Vein: No evidence of thrombus. Normal compressibility and flow on color Doppler imaging. Femoral Vein: No evidence of thrombus. Normal compressibility, respiratory phasicity and response to augmentation. Popliteal Vein: No evidence of thrombus. Normal compressibility, respiratory phasicity and response to augmentation. Calf Veins: No evidence of thrombus. Normal compressibility and flow on color Doppler imaging. Other Findings:  None. LEFT LOWER EXTREMITY Common Femoral Vein: No evidence of thrombus. Normal compressibility, respiratory phasicity and response to augmentation. Saphenofemoral Junction: No evidence of thrombus. Normal compressibility and flow on color Doppler imaging. Profunda Femoral Vein: No evidence of thrombus. Normal compressibility and flow on color Doppler imaging. Femoral Vein: No evidence of thrombus. Normal compressibility, respiratory phasicity and response to augmentation. Popliteal Vein: No evidence of thrombus. Normal compressibility, respiratory phasicity and response to augmentation. Calf Veins: No evidence of thrombus. Normal compressibility and flow on color Doppler imaging. Other Findings:  None. IMPRESSION: No evidence of deep venous thrombosis in either lower extremity. Electronically Signed   By: Richarda OverlieAdam  Henn M.D.   On: 12/03/2020 15:33   DG Abd Portable 1V  Result Date: 12/02/2020 CLINICAL DATA:  Abdominal pain and distension EXAM: PORTABLE ABDOMEN - 1 VIEW COMPARISON:  None. FINDINGS: Scattered large and small bowel gas is noted. No obstructive changes are seen. No free air is noted. Degenerative changes of lumbar spine are seen. IMPRESSION:  No acute abnormality to correspond with the given clinical history. Electronically Signed   By: Alcide CleverMark  Lukens M.D.   On: 12/02/2020 18:32    Medications:    aspirin  81 mg Oral Daily   atorvastatin  40 mg Oral Daily   diclofenac Sodium  2 g Topical TID AC & HS   enoxaparin (LOVENOX) injection  1 mg/kg Subcutaneous Q12H   isosorbide-hydrALAZINE  0.5 tablet Oral TID   melatonin  5 mg Oral QHS   metoprolol succinate  25 mg Oral Daily   milk and molasses  1 enema Rectal Once   nystatin cream   Topical BID   senna-docusate  2 tablet Oral QHS   sodium chloride flush  3 mL Intravenous Q12H   sodium chloride flush  3 mL Intravenous Q12H   Continuous Infusions:  sodium chloride       LOS: 9 days   Joseph Art  [facility]   How to contact the [facility] Attending or Consulting provider 7A - 7P or covering provider during after hours 7P -7A, for this patient?  Check the care team in [facility] and look for a) attending/consulting [facility] provider listed and b) the [facility] team listed Log into [email or web address] and use [facility] universal password to access. If you do not have the password, please contact the hospital operator. Locate the [facility] provider you are looking for under [facility] and page to a number that you can be directly reached. If you still have difficulty reaching the provider, please page the [facility] (Director on Call) for the Hospitalists listed on amion for assistance.  12/04/2020, 10:55 AM

## 2020-12-04 NOTE — Progress Notes (Signed)
Progress Note  Patient Name: [name] Date of Encounter: 12/04/2020  Primary Cardiologist: Yvonne Kendall, MD  Subjective   Feels that breathing is back to baseline.  She notes that she has chronic orthopnea, which persists.  No chest pain.  BUN/Creat elevated each of the past two mornings.  Both losartan and torsemide held today.  Inpatient Medications    Scheduled Meds:  aspirin  81 mg Oral Daily   atorvastatin  40 mg Oral Daily   diclofenac Sodium  2 g Topical TID AC & HS   enoxaparin (LOVENOX) injection  1 mg/kg Subcutaneous Q12H   isosorbide-hydrALAZINE  0.5 tablet Oral TID   melatonin  5 mg Oral QHS   metoprolol succinate  25 mg Oral Daily   milk and molasses  1 enema Rectal Once   nystatin cream   Topical BID   senna-docusate  2 tablet Oral QHS   sodium chloride flush  3 mL Intravenous Q12H   sodium chloride flush  3 mL Intravenous Q12H   Continuous Infusions:  sodium chloride     PRN Meds: sodium chloride, acetaminophen, alum & mag hydroxide-simeth, benzonatate, HYDROcodone-acetaminophen, hydrOXYzine, lidocaine, magnesium hydroxide, ondansetron (ZOFRAN) IV, sodium chloride flush   Vital Signs    Vitals:   12/03/20 1436 12/03/20 1950 12/04/20 0456 12/04/20 0815  BP: 102/64 130/86 (!) 141/73 134/78  Pulse: 82 84 80 80  Resp: 18 18 16 18   Temp: 97.8 F (36.6 C) 98.2 F (36.8 C) 98.4 F (36.9 C) 98 F (36.7 C)  TempSrc:    Oral  SpO2: 94% 96% 92% 97%  Weight:      Height:        Intake/Output Summary (Last 24 hours) at 12/04/2020 1130 Last data filed at 12/04/2020 0930 Gross per 24 hour  Intake 960 ml  Output 200 ml  Net 760 ml   Filed Weights   12/01/20 0425 12/02/20 0600 12/03/20 0542  Weight: 91.9 kg 91 kg 91.1 kg    Physical Exam   GEN: Obese, in no acute distress.  HEENT: Grossly normal.  Neck: Supple, obese, difficult to gauge JVP.  No carotid bruits, or masses. Cardiac: RRR, I do not appreciate any murmurs.  No rubs, or gallops.  No clubbing, cyanosis, edema.  Radials 2+, DP/PT 2+ and equal bilaterally.  Respiratory:  Respirations regular and unlabored, clear to auscultation bilaterally. GI: Obese, soft, nontender, nondistended, BS + x 4. MS: no deformity or atrophy. Skin: warm and dry, no rash. Neuro:  Strength and sensation are intact. Psych: AAOx3.  Normal affect.  Labs    Chemistry Recent Labs  Lab 12/02/20 0451 12/03/20 1014 12/04/20 0619  NA 137 139 139  K 4.7 4.2 3.9  CL 108 103 105  CO2 22 29 25   GLUCOSE 121* 115* 106*  BUN 39* 30* 30*  CREATININE 1.45* 1.57* 1.69*  CALCIUM 8.7* 9.3 8.7*  GFRNONAA 39* 35* 32*  ANIONGAP 7 7 9      Hematology Recent Labs  Lab 11/29/20 0543 11/30/20 0708 12/04/20 0619  WBC 6.5 5.6 6.9  RBC 4.35 4.28 4.42  HGB 14.1 13.9 14.1  HCT 42.3 41.8 42.3  MCV 97.2 97.7 95.7  MCH 32.4 32.5 31.9  MCHC 33.3 33.3 33.3  RDW 13.6 13.4 13.8  PLT 148* 154 193    Cardiac Enzymes  Recent Labs  Lab 11/25/20 1053 11/25/20 1327  TROPONINIHS 149* 134*     DDimer  Recent Labs  Lab 12/02/20 1858  DDIMER 1.73*  Lipids  Lab Results  Component Value Date   CHOL 162 11/25/2020   HDL 41 11/25/2020   LDLCALC 101 (H) 11/25/2020   TRIG 98 11/25/2020   CHOLHDL 4.0 11/25/2020    HbA1c  Lab Results  Component Value Date   HGBA1C 6.4 (H) 11/25/2020    Radiology    DG Chest 2 View  Result Date: 12/03/2020 CLINICAL DATA:  Shortness of breath EXAM: CHEST - 2 VIEW COMPARISON:  11/25/2020 FINDINGS: Cardiac shadow remains enlarged. Patient rotation to the right is noted further accentuating the mediastinal markings. Bibasilar airspace opacity is noted left slightly greater than right new from the prior exam. No bony abnormality is noted. IMPRESSION: Increasing bibasilar airspace opacities left greater than right. Electronically Signed   By: Alcide Clever M.D.   On: 12/03/2020 15:29   NM Pulmonary Perfusion  Addendum Date: 12/03/2020   ADDENDUM REPORT: 12/03/2020  15:47 ADDENDUM: This addendum was made after review of a chest radiograph and lower extremity venous duplex on 12/03/2020. There is volume loss in the right lung and there are patchy densities at the left lung base that could be contributing to the perfusion abnormalities. Based on these imaging findings, pulmonary embolism cannot be excluded but the study is equivocal. Findings discussed with Dr. Benjamine Mola on 12/03/2020 at 1540 hours. Electronically Signed   By: Richarda Overlie M.D.   On: 12/03/2020 15:47   Addendum Date: 12/03/2020   ADDENDUM REPORT: 12/03/2020 13:23 ADDENDUM: These results were called by telephone at the time of interpretation on 12/03/2020 at 1:22 pm to provider Dr. Benjamine Mola, who verbally acknowledged these results. Electronically Signed   By: Richarda Overlie M.D.   On: 12/03/2020 13:23   Result Date: 12/03/2020 CLINICAL DATA:  70 year old with suspected pulmonary embolism. Shortness of breath for 5 days. Chest radiograph and chest CT on 11/25/2020. EXAM: NUCLEAR MEDICINE PERFUSION LUNG SCAN TECHNIQUE: Perfusion images were obtained in multiple projections after intravenous injection of radiopharmaceutical. Ventilation scans intentionally deferred if perfusion scan and chest x-ray adequate for interpretation during COVID 19 epidemic. RADIOPHARMACEUTICALS:  3.97 mCi Tc-34m MAA IV COMPARISON:  Chest CT and chest radiograph 11/25/2020 FINDINGS: Abnormal perfusion in the right lung particularly along the anterior aspect of the mid and lower right lung. Evidence for a large wedge-shaped defect involving the anterior left lower lung. Most recent chest radiograph was on 11/25/2020. IMPRESSION: Abnormal perfusion in both lungs. Findings are suggestive for pulmonary embolism. Recommend updated chest radiograph to exclude significant lung abnormalities. Electronically Signed: By: Richarda Overlie M.D. On: 12/03/2020 13:18   US Venous Img Lower Bilateral (DVT)  Result Date: 12/03/2020 CLINICAL DATA:  70 year old with  abnormal perfusion on nuclear medicine examination. Findings raise concern for pulmonary embolism. Evaluate for lower extremity DVT. EXAM: BILATERAL LOWER EXTREMITY VENOUS DOPPLER ULTRASOUND TECHNIQUE: Gray-scale sonography with graded compression, as well as color Doppler and duplex ultrasound were performed to evaluate the lower extremity deep venous systems from the level of the common femoral vein and including the common femoral, femoral, profunda femoral, popliteal and calf veins including the posterior tibial, peroneal and gastrocnemius veins when visible. The superficial great saphenous vein was also interrogated. Spectral Doppler was utilized to evaluate flow at rest and with distal augmentation maneuvers in the common femoral, femoral and popliteal veins. COMPARISON:  None. FINDINGS: RIGHT LOWER EXTREMITY Common Femoral Vein: No evidence of thrombus. Normal compressibility, respiratory phasicity and response to augmentation. Saphenofemoral Junction: No evidence of thrombus. Normal compressibility and flow on color Doppler imaging. Profunda Femoral  Vein: No evidence of thrombus. Normal compressibility and flow on color Doppler imaging. Femoral Vein: No evidence of thrombus. Normal compressibility, respiratory phasicity and response to augmentation. Popliteal Vein: No evidence of thrombus. Normal compressibility, respiratory phasicity and response to augmentation. Calf Veins: No evidence of thrombus. Normal compressibility and flow on color Doppler imaging. Other Findings:  None. LEFT LOWER EXTREMITY Common Femoral Vein: No evidence of thrombus. Normal compressibility, respiratory phasicity and response to augmentation. Saphenofemoral Junction: No evidence of thrombus. Normal compressibility and flow on color Doppler imaging. Profunda Femoral Vein: No evidence of thrombus. Normal compressibility and flow on color Doppler imaging. Femoral Vein: No evidence of thrombus. Normal compressibility, respiratory  phasicity and response to augmentation. Popliteal Vein: No evidence of thrombus. Normal compressibility, respiratory phasicity and response to augmentation. Calf Veins: No evidence of thrombus. Normal compressibility and flow on color Doppler imaging. Other Findings:  None. IMPRESSION: No evidence of deep venous thrombosis in either lower extremity. Electronically Signed   By: Richarda Overlie M.D.   On: 12/03/2020 15:33   DG Abd Portable 1V  Result Date: 12/02/2020 CLINICAL DATA:  Abdominal pain and distension EXAM: PORTABLE ABDOMEN - 1 VIEW COMPARISON:  None. FINDINGS: Scattered large and small bowel gas is noted. No obstructive changes are seen. No free air is noted. Degenerative changes of lumbar spine are seen. IMPRESSION: No acute abnormality to correspond with the given clinical history. Electronically Signed   By: Alcide Clever M.D.   On: 12/02/2020 18:32    Telemetry    RSR - Personally Reviewed  Cardiac Studies   2D Echocardiogram 6.11.2022    1. Left ventricular ejection fraction, by estimation, is 20 to 25%. The  left ventricle has severely decreased function. The left ventricle  demonstrates global hypokinesis. The left ventricular internal cavity size  was moderately dilated. There is mild  left ventricular hypertrophy. Left ventricular diastolic parameters are  indeterminate.   2. Right ventricular systolic function is moderately reduced. The right  ventricular size is normal.   3. Left atrial size was severely dilated.   4. Right atrial size was severely dilated.   5. The mitral valve is normal in structure. Moderate to severe mitral  valve regurgitation.   6. The aortic valve was not well visualized. Aortic valve regurgitation  is mild.  Patient Profile     69 y.o. female with history of HTN and tobacco use who has not seen a physician as an outpatient in several years admitted with sudden onset of dyspnea and hypertensive emergency on 11/25/2020, who we are seeing for acute  HFrEF, moderate to severe mitral regurgitation, and LBBB with worsening renal function noted with IV diuresis.   Assessment & Plan    1.  Acute HFrEF/Cardiomyopathy:  Admitted 6/10 w/ dyspnea and hypertensive urgency.  Found to be markedly volume overloaded.  Initially received IV lasix x 4, CT contrast, BiDil, and metoprolol and developed hypotension w/ subsequent rise in creat to 2.75 on 6/14.  Creat 1.45 on 6/17.  Creat rising over past 2 days (1.45  1.57  1.69) since resuming oral torsemide and losartan  will hold both given plan for R and L heart cath.  + 760 ml on 6/18, though only 200 ml of UO recorded, and she says she went much more than that.   Exam challenging due to body habitus, though lungs are clear, abd soft, and no lower ext edema.  ReDS vest not currently available.  Cont ? blocker and bidil.  Plan cath  for tomorrow pending renal fxn.  The patient understands that risks include but are not limited to stroke (1 in 1000), death (1 in 1000), kidney failure [usually temporary] (1 in 500), bleeding (1 in 200), allergic reaction [possibly serious] (1 in 200), and agrees to proceed.    2.  Moderate to severe MR:  in setting of LV dysfxn and volume overload.  I do not appreciate a murmur on exam.  R&L heart cath tomorrow.  3.  AKI:  in setting of hypotension, diuresis, contrast.  As above, Creat rising again over past two days. Torsemide and losartan on hold.  F/u in AM.  4.  Hypertensive emergency:  BP has been relatively stable.  Follow off of ARB and torsemide.  5.  Tob abuse:  cessation advised.  6.  Demand Ischemia:  Mild HsTrop elev in setting of above (149  13).  No chest pain.  Cath tomorrow.  Cor Ca2+ noted on CTA earlier this admission.  Cont ? blocker, asa, and statin.  7.  L sided rib pain:  in setting of coughing.  Area is tender.  CTA earlier this admissinon for PE.  V:Q scan equivocal.  Likely MSK.  Signed, Nicolasa Ducking, NP  12/04/2020, 11:30 AM    For questions or  updates, please contact   Please consult [email or web address] for contact info under Cardiology/STEMI.

## 2020-12-05 ENCOUNTER — Encounter: Payer: Self-pay | Admitting: Internal Medicine

## 2020-12-05 ENCOUNTER — Encounter
Admission: EM | Disposition: A | Discharge status: Home / Self Care | Payer: Self-pay | Source: Home / Self Care | Attending: Internal Medicine

## 2020-12-05 DIAGNOSIS — I161 Hypertensive emergency: Secondary | ICD-10-CM

## 2020-12-05 DIAGNOSIS — I2510 Atherosclerotic heart disease of native coronary artery without angina pectoris: Secondary | ICD-10-CM

## 2020-12-05 HISTORY — PX: RIGHT/LEFT HEART CATH AND CORONARY ANGIOGRAPHY: CATH118266

## 2020-12-05 LAB — BASIC METABOLIC PANEL
Anion gap: 9 (ref 5–15)
BUN: 26 mg/dL — ABNORMAL HIGH (ref 8–23)
CO2: 24 mmol/L (ref 22–32)
Calcium: 8.7 mg/dL — ABNORMAL LOW (ref 8.9–10.3)
Chloride: 105 mmol/L (ref 98–111)
Creatinine, Ser: 1.28 mg/dL — ABNORMAL HIGH (ref 0.44–1.00)
GFR, Estimated: 45 mL/min — ABNORMAL LOW (ref >60)
Glucose, Bld: 109 mg/dL — ABNORMAL HIGH (ref 70–99)
Potassium: 3.8 mmol/L (ref 3.5–5.1)
Sodium: 138 mmol/L (ref 135–145)

## 2020-12-05 SURGERY — RIGHT/LEFT HEART CATH AND CORONARY ANGIOGRAPHY
Anesthesia: Moderate Sedation

## 2020-12-05 MED ORDER — FENTANYL CITRATE (PF) 100 MCG/2ML IJ SOLN
INTRAMUSCULAR | Status: AC
  Filled 2020-12-05: qty 2

## 2020-12-05 MED ORDER — SODIUM CHLORIDE 0.9 % IV SOLN: 250.0000 mL | INTRAVENOUS | Status: DC | PRN

## 2020-12-05 MED ORDER — IOHEXOL 300 MG/ML  SOLN
INTRAMUSCULAR | Status: DC | PRN
  Administered 2020-12-05: 48 mL

## 2020-12-05 MED ORDER — FUROSEMIDE 10 MG/ML IJ SOLN
INTRAMUSCULAR | Status: AC
  Filled 2020-12-05: qty 2

## 2020-12-05 MED ORDER — ASPIRIN 81 MG PO CHEW
81.0000 mg | CHEWABLE_TABLET | ORAL | Status: AC
  Administered 2020-12-05: 81 mg via ORAL
  Filled 2020-12-05: qty 1

## 2020-12-05 MED ORDER — ENSURE ENLIVE PO LIQD
237.0000 mL | Freq: Two times a day (BID) | ORAL | Status: DC
  Administered 2020-12-06 (×2): 237 mL via ORAL

## 2020-12-05 MED ORDER — ISOSORB DINITRATE-HYDRALAZINE 20-37.5 MG PO TABS
1.0000 | ORAL_TABLET | Freq: Three times a day (TID) | ORAL | Status: DC
  Administered 2020-12-05 – 2020-12-07 (×6): 1 via ORAL
  Filled 2020-12-05 (×9): qty 1

## 2020-12-05 MED ORDER — ASPIRIN 81 MG PO CHEW: 81.0000 mg | CHEWABLE_TABLET | ORAL | Status: DC

## 2020-12-05 MED ORDER — SODIUM CHLORIDE 0.9 % IV SOLN: INTRAVENOUS | Status: DC

## 2020-12-05 MED ORDER — HEPARIN (PORCINE) IN NACL 1000-0.9 UT/500ML-% IV SOLN
INTRAVENOUS | Status: AC
  Filled 2020-12-05: qty 1000

## 2020-12-05 MED ORDER — HEPARIN SODIUM (PORCINE) 1000 UNIT/ML IJ SOLN
INTRAMUSCULAR | Status: DC | PRN
  Administered 2020-12-05: 4500 [IU] via INTRAVENOUS

## 2020-12-05 MED ORDER — HYDRALAZINE HCL 20 MG/ML IJ SOLN: 10.0000 mg | INTRAMUSCULAR | Status: AC | PRN

## 2020-12-05 MED ORDER — LIDOCAINE HCL (PF) 1 % IJ SOLN
INTRAMUSCULAR | Status: DC | PRN
  Administered 2020-12-05: 30 mL

## 2020-12-05 MED ORDER — FUROSEMIDE 10 MG/ML IJ SOLN
60.0000 mg | Freq: Every day | INTRAMUSCULAR | Status: DC
  Administered 2020-12-06: 60 mg via INTRAVENOUS
  Filled 2020-12-05: qty 6

## 2020-12-05 MED ORDER — SODIUM CHLORIDE 0.9% FLUSH: 3.0000 mL | INTRAVENOUS | Status: DC | PRN

## 2020-12-05 MED ORDER — MIDAZOLAM HCL 2 MG/2ML IJ SOLN
INTRAMUSCULAR | Status: DC | PRN
  Administered 2020-12-05: 0.5 mg via INTRAVENOUS

## 2020-12-05 MED ORDER — LIDOCAINE HCL (PF) 1 % IJ SOLN
INTRAMUSCULAR | Status: AC
  Filled 2020-12-05: qty 30

## 2020-12-05 MED ORDER — SODIUM CHLORIDE 0.9% FLUSH
3.0000 mL | Freq: Two times a day (BID) | INTRAVENOUS | Status: DC
  Administered 2020-12-05 – 2020-12-09 (×9): 3 mL via INTRAVENOUS

## 2020-12-05 MED ORDER — FUROSEMIDE 10 MG/ML IJ SOLN
INTRAMUSCULAR | Status: AC
  Administered 2020-12-05: 60 mg via INTRAVENOUS
  Filled 2020-12-05: qty 4

## 2020-12-05 MED ORDER — VERAPAMIL HCL 2.5 MG/ML IV SOLN
INTRAVENOUS | Status: AC
  Filled 2020-12-05: qty 2

## 2020-12-05 MED ORDER — HEPARIN SODIUM (PORCINE) 1000 UNIT/ML IJ SOLN
INTRAMUSCULAR | Status: AC
  Filled 2020-12-05: qty 1

## 2020-12-05 MED ORDER — HEPARIN (PORCINE) IN NACL 2000-0.9 UNIT/L-% IV SOLN
INTRAVENOUS | Status: DC | PRN
  Administered 2020-12-05: 1000 mL

## 2020-12-05 MED ORDER — VERAPAMIL HCL 2.5 MG/ML IV SOLN
INTRAVENOUS | Status: DC | PRN
  Administered 2020-12-05: 2.5 mg via INTRA_ARTERIAL

## 2020-12-05 MED ORDER — FENTANYL CITRATE (PF) 100 MCG/2ML IJ SOLN
INTRAMUSCULAR | Status: DC | PRN
  Administered 2020-12-05: 12.5 ug via INTRAVENOUS

## 2020-12-05 MED ORDER — MIDAZOLAM HCL 2 MG/2ML IJ SOLN
INTRAMUSCULAR | Status: AC
  Filled 2020-12-05: qty 2

## 2020-12-05 SURGICAL SUPPLY — 15 items
CATH BALLN WEDGE 5F 110CM (CATHETERS) ×2 IMPLANT
CATH INFINITI 5 FR JL3.5 (CATHETERS) ×2 IMPLANT
CATH INFINITI JR4 5F (CATHETERS) ×2 IMPLANT
DEVICE RAD TR BAND REGULAR (VASCULAR PRODUCTS) ×2 IMPLANT
DRAPE BRACHIAL (DRAPES) ×4 IMPLANT
GLIDESHEATH SLEND SS 6F .021 (SHEATH) ×2 IMPLANT
GUIDEWIRE INQWIRE 1.5J.035X260 (WIRE) ×1 IMPLANT
INQWIRE 1.5J .035X260CM (WIRE) ×2
PACK CARDIAC CATH (CUSTOM PROCEDURE TRAY) ×2 IMPLANT
PAD ELECT DEFIB RADIOL ZOLL (MISCELLANEOUS) ×2 IMPLANT
PANNUS RETENTION SYSTEM 2 PAD (MISCELLANEOUS) ×2 IMPLANT
PROTECTION STATION PRESSURIZED (MISCELLANEOUS) ×2
SET ATX SIMPLICITY (MISCELLANEOUS) ×2 IMPLANT
SHEATH GLIDE SLENDER 4/5FR (SHEATH) ×2 IMPLANT
STATION PROTECTION PRESSURIZED (MISCELLANEOUS) ×1 IMPLANT

## 2020-12-05 NOTE — Plan of Care (Signed)
  Problem: Coping: Goal: Level of anxiety will decrease Outcome: Not Progressing  Pt for Cardiac Cath today.

## 2020-12-05 NOTE — Progress Notes (Signed)
OT Cancellation Note  Patient Details Name: Evelyn Dunn MRN: [id number] DOB: Mar 11, 1951   Cancelled Treatment:    Reason Eval/Treat Not Completed: Patient at procedure or test/ unavailable. Pt out of room, per chart scheduled for cardiac cath today. Will re-attempt OT tx at later date/time as available and medically appropriate.   Wynona Canes, MPH, MS, OTR/L ascom [phone] 12/05/20, 8:09 AM

## 2020-12-05 NOTE — Interval H&P Note (Signed)
History and Physical Interval Note:  12/05/2020 8:53 AM  Evelyn Dunn  has presented today for surgery, with the diagnosis of acute HFrEF.  The various methods of treatment have been discussed with the patient and family. After consideration of risks, benefits and other options for treatment, the patient has consented to  Procedure(s): RIGHT/LEFT HEART CATH AND CORONARY ANGIOGRAPHY (N/A) as a surgical intervention.  The patient's history has been reviewed, patient examined, no change in status, stable for surgery.  I have reviewed the patient's chart and labs.  Questions were answered to the patient's satisfaction.    Cath Lab Visit (complete for each Cath Lab visit)  Clinical Evaluation Leading to the Procedure:   ACS: No.  Non-ACS:    Anginal/Heart Failure Classification: NYHA IV  Anti-ischemic medical therapy: Maximal Therapy (2 or more classes of medications)  Non-Invasive Test Results: No non-invasive testing performed (LVEF 20-25% by echo -> high risk)  Prior CABG: No previous CABG  Evelyn Dunn

## 2020-12-05 NOTE — Progress Notes (Signed)
Initial Nutrition Assessment  DOCUMENTATION CODES:   Obesity unspecified  INTERVENTION:   Ensure Enlive po BID, each supplement provides 350 kcal and 20 grams of protein  Low sodium diet education   NUTRITION DIAGNOSIS:   Inadequate oral intake related to acute illness as evidenced by per patient/family report.  GOAL:   Patient will meet greater than or equal to 90% of their needs  MONITOR:   PO intake, Supplement acceptance, Labs, Weight trends, Skin, I & O's  REASON FOR ASSESSMENT:   Malnutrition Screening Tool    ASSESSMENT:   70 y.o. female with h/o HTN, tobacco use and CKD III who is admitted with new CHF and AKI.  Met with pt in room today. Pt reports good appetite and oral intake at baseline but reports decreased oral intake over the past several days. Pt reports that her appetite is improving in hospital; pt eating 50-100% of meals. RD discussed with pt the importance of adequate nutrition needed to preserve lean muscle. RD also provided pt with low sodium diet education. Pt would like to have vanilla Ensure in hospital; RD will order. Per chart, pt is down 15lbs since admit but reports that her weight is stable at baseline.   RD provided "Low Sodium Nutrition Therapy" handout from the [employer]. Reviewed patient's dietary recall. Provided examples on ways to decrease sodium intake in diet. Discouraged intake of processed foods and use of salt shaker. Encouraged fresh fruits and vegetables as well as whole grain sources of carbohydrates to maximize fiber intake.   RD discussed why it is important for patient to adhere to diet recommendations, and emphasized the role of fluids, foods to avoid, and importance of weighing self daily. Teach back method used.  Medications reviewed and include: aspirin, lovenox, lasix, melatonin, senokot  Labs reviewed: BUN 26(H), creat 1.28(H)  NUTRITION - FOCUSED PHYSICAL EXAM:  Flowsheet Row Most Recent Value   Orbital Region No depletion  Upper Arm Region No depletion  Thoracic and Lumbar Region No depletion  Buccal Region No depletion  Temple Region No depletion  Clavicle Bone Region No depletion  Clavicle and Acromion Bone Region No depletion  Scapular Bone Region No depletion  Dorsal Hand No depletion  Patellar Region Mild depletion  Anterior Thigh Region Mild depletion  Posterior Calf Region Mild depletion  Edema (RD Assessment) None  Hair Reviewed  Eyes Reviewed  Mouth Reviewed  Skin Reviewed  Nails Reviewed      Diet Order:   Diet Order             Diet Heart Room service appropriate? Yes; Fluid consistency: Thin  Diet effective now                  EDUCATION NEEDS:   Education needs have been addressed  Skin:  Skin Assessment: Reviewed RN Assessment  Last BM:  6/19- TYPE 7  Height:   Ht Readings from Last 1 Encounters:  12/05/20 $RemoveB'5\' 2"'akWuBElJ$  (1.575 m)    Weight:   Wt Readings from Last 1 Encounters:  12/05/20 91.1 kg    Ideal Body Weight:  50 kg  BMI:  Body mass index is 36.73 kg/m.  Estimated Nutritional Needs:   Kcal:  1800-2100kcal/day  Protein:  90-105g/day  Fluid:  1.3-1.5L/day  Koleen Distance MS, RD, LDN Please refer to [facility] for RD and/or RD on-call/weekend/after hours pager

## 2020-12-05 NOTE — Care Management Important Message (Signed)
Important Message  Patient Details  Name: Evelyn Dunn MRN: [id number] Date of Birth: 1950-08-26   Medicare Important Message Given:  Yes     Johnell Comings 12/05/2020, 11:57 AM

## 2020-12-05 NOTE — Progress Notes (Signed)
Progress Note    Evelyn Dunn  ELF:[id number] DOB: [birth year]  DOA: 11/25/2020 PCP: Pcp, No    Brief Narrative:     Medical records reviewed and are as summarized below:  Evelyn Dunn is an 70 y.o. female  with HTN, tobacco use, who comes to the hospital with shortness of breath.  This is been progressively getting worse over the last 4 days, worse with exertion and unable to lay flat.  Impression in the ED was acute on chronic diastolic CHF, she was placed on diuretics and was admitted to the hospital.  She was also significantly hypertensive.  Cardiology consulted as she was found to have a left bundle branch block and new onset systolic CHF.  Await Cr to be stable enough for heart cath.     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 --  A 2D echo done on 6/11 showed EF 20-25% with severely decreased LV function.  RV function was moderately reduced also.   --[facility] consulted and following-she was placed on Lasix and developed AKI, now on hold --Cath pending improvement in her renal function -Continue metoprolol --cards:  s/p cardiac cath  Conclusions: Moderate to severe single-vessel coronary artery disease with 20-30% proximal and tandem 50-70% mid LAD stenoses.  There is mild disease involving the LCx and RCA.  Severity of cardiomyopathy is out of proportion to degree of coronary artery disease suggesting primarily non-ischemic etiology. Moderate to severely elevated left heart filling pressure. Severely elevated right heart and pulmonary artery  pressures. Moderately reduced Fick cardiac output/index.   Recommendations: Restart diuresis.  If creatinine worsens in the setting of diuresis, short-term inotropic support may be needed. Optimize goal-directed medical therapy.  Defer restarting ACEI/ARB until tomorrow in order to monitor renal function. Medical therapy and risk factor modification to prevent progression of coronary artery disease.  Once the patient has been optimized from a heart failure standpoint, myocardial perfusion stress test should be considered to assess hemodynamic significance of LAD disease. Obtain renal artery Doppler to exclude renal artery stenosis in the setting of worsening renal function with addition of ACEI/ARB.     Hypertensive emergency-blood pressure now normalized  Tobacco use-cessation has been encouraged  Troponin elevation-flat, downtrending, not in a pattern consistent with ACS.  Likely demand ischemia from severe hypertension   Acute kidney injury on chronic kidney disease stage IIIa -creatinine is noted to be 1.3 on arrival, likely her baseline. -- RAD pending  Elevated d dimer -V/Q scan unclear (x ray with b/l opacities l>r) -duplex LE negative -doubt PE- recent CTA negative for PE   obesity Body mass index is 36.73 kg/m.   Family Communication/Anticipated D/C date and plan/Code Status   DVT prophylaxis: Lovenox ordered. Code Status: Full Code.   Disposition Plan: Status is: Inpatient  Remains inpatient appropriate because:Inpatient level of care appropriate due to severity of illness  Dispo: The patient is from: Home              Anticipated d/c is to: Home  Patient currently is not medically stable to d/c.   Difficult to place patient No         Medical Consultants:   cards    Subjective:   Headed to heart cath  Objective:    Vitals:   12/05/20 1130 12/05/20 1145 12/05/20 1215 12/05/20 1236  BP: 131/68 112/69 123/64 121/70  Pulse: (!) 111  (!) 110 96 94  Resp: 18 (!) 21 19 18   Temp:   98.2 F (36.8 C) 99.1 F (37.3 C)  TempSrc:   Oral   SpO2: 90% 90% 92% 95%  Weight:      Height:        Intake/Output Summary (Last 24 hours) at 12/05/2020 1325 Last data filed at 12/05/2020 1100 Gross per 24 hour  Intake 1200 ml  Output 1700 ml  Net -500 ml   Filed Weights   12/02/20 0600 12/03/20 0542 12/05/20 0806  Weight: 91 kg 91.1 kg 91.1 kg    Exam:  Headed to heart cath      Data Reviewed:   I have personally reviewed following labs and imaging studies:  Labs: Labs show the following:   Basic Metabolic Panel: Recent Labs  Lab 11/30/20 0708 12/02/20 0451 12/03/20 1014 12/04/20 0619 12/05/20 0507  NA 137 137 139 139 138  K 3.9 4.7 4.2 3.9 3.8  CL 101 108 103 105 105  CO2 20* 22 29 25 24   GLUCOSE 106* 121* 115* 106* 109*  BUN 57* 39* 30* 30* 26*  CREATININE 2.54* 1.45* 1.57* 1.69* 1.28*  CALCIUM 9.0 8.7* 9.3 8.7* 8.7*   GFR Estimated Creatinine Clearance: 43.5 mL/min (A) (by C-G formula based on SCr of 1.28 mg/dL (H)). Liver Function Tests: No results for input(s): AST, ALT, ALKPHOS, BILITOT, PROT, ALBUMIN in the last 168 hours. No results for input(s): LIPASE, AMYLASE in the last 168 hours. No results for input(s): AMMONIA in the last 168 hours. Coagulation profile No results for input(s): INR, PROTIME in the last 168 hours.  CBC: Recent Labs  Lab 11/29/20 0543 11/30/20 0708 12/04/20 0619  WBC 6.5 5.6 6.9  HGB 14.1 13.9 14.1  HCT 42.3 41.8 42.3  MCV 97.2 97.7 95.7  PLT 148* 154 193   Cardiac Enzymes: No results for input(s): CKTOTAL, CKMB, CKMBINDEX, TROPONINI in the last 168 hours. BNP (last 3 results) No results for input(s): PROBNP in the last 8760 hours. CBG: No results for input(s): GLUCAP in the last 168 hours. D-Dimer: Recent Labs    12/02/20 1858  DDIMER 1.73*   Hgb A1c: No results for input(s): HGBA1C in the last 72 hours. Lipid Profile: No results for input(s): CHOL,  HDL, LDLCALC, TRIG, CHOLHDL, LDLDIRECT in the last 72 hours. Thyroid function studies: No results for input(s): TSH, T4TOTAL, T3FREE, THYROIDAB in the last 72 hours.  Invalid input(s): FREET3 Anemia work up: No results for input(s): VITAMINB12, FOLATE, FERRITIN, TIBC, IRON, RETICCTPCT in the last 72 hours. Sepsis Labs: Recent Labs  Lab 11/29/20 0543 11/30/20 0708 12/04/20 0619  WBC 6.5 5.6 6.9    Microbiology No results found for this or any previous visit (from the past 240 hour(s)).   Procedures and diagnostic studies:  DG Chest 2 View  Result Date: 12/03/2020 CLINICAL DATA:  Shortness of breath EXAM: CHEST - 2 VIEW COMPARISON:  11/25/2020 FINDINGS: Cardiac shadow remains enlarged. Patient rotation to the right is noted further accentuating the mediastinal markings. Bibasilar airspace opacity is noted left slightly greater than right new from the prior exam.  No bony abnormality is noted. IMPRESSION: Increasing bibasilar airspace opacities left greater than right. Electronically Signed   By: Alcide Clever M.D.   On: 12/03/2020 15:29   CARDIAC CATHETERIZATION  Result Date: 12/05/2020 Conclusions: 1. Moderate to severe single-vessel coronary artery disease with 20-30% proximal and tandem 50-70% mid LAD stenoses.  There is mild disease involving the LCx and RCA.  Severity of cardiomyopathy is out of proportion to degree of coronary artery disease suggesting primarily non-ischemic etiology. 2. Moderate to severely elevated left heart filling pressure. 3. Severely elevated right heart and pulmonary artery pressures. 4. Moderately reduced Fick cardiac output/index. Recommendations: 1. Restart diuresis.  If creatinine worsens in the setting of diuresis, short-term inotropic support may be needed. 2. Optimize goal-directed medical therapy.  Defer restarting ACEI/ARB until tomorrow in order to monitor renal function. 3. Medical therapy and risk factor modification to prevent progression of  coronary artery disease.  Once the patient has been optimized from a heart failure standpoint, myocardial perfusion stress test should be considered to assess hemodynamic significance of LAD disease. 4. Obtain renal artery Doppler to exclude renal artery stenosis in the setting of worsening renal function with addition of ACEI/ARB. Yvonne Kendall, MD [facility]   US Venous Img Lower Bilateral (DVT)  Result Date: 12/03/2020 CLINICAL DATA:  70 year old with abnormal perfusion on nuclear medicine examination. Findings raise concern for pulmonary embolism. Evaluate for lower extremity DVT. EXAM: BILATERAL LOWER EXTREMITY VENOUS DOPPLER ULTRASOUND TECHNIQUE: Gray-scale sonography with graded compression, as well as color Doppler and duplex ultrasound were performed to evaluate the lower extremity deep venous systems from the level of the common femoral vein and including the common femoral, femoral, profunda femoral, popliteal and calf veins including the posterior tibial, peroneal and gastrocnemius veins when visible. The superficial great saphenous vein was also interrogated. Spectral Doppler was utilized to evaluate flow at rest and with distal augmentation maneuvers in the common femoral, femoral and popliteal veins. COMPARISON:  None. FINDINGS: RIGHT LOWER EXTREMITY Common Femoral Vein: No evidence of thrombus. Normal compressibility, respiratory phasicity and response to augmentation. Saphenofemoral Junction: No evidence of thrombus. Normal compressibility and flow on color Doppler imaging. Profunda Femoral Vein: No evidence of thrombus. Normal compressibility and flow on color Doppler imaging. Femoral Vein: No evidence of thrombus. Normal compressibility, respiratory phasicity and response to augmentation. Popliteal Vein: No evidence of thrombus. Normal compressibility, respiratory phasicity and response to augmentation. Calf Veins: No evidence of thrombus. Normal compressibility and flow on color Doppler  imaging. Other Findings:  None. LEFT LOWER EXTREMITY Common Femoral Vein: No evidence of thrombus. Normal compressibility, respiratory phasicity and response to augmentation. Saphenofemoral Junction: No evidence of thrombus. Normal compressibility and flow on color Doppler imaging. Profunda Femoral Vein: No evidence of thrombus. Normal compressibility and flow on color Doppler imaging. Femoral Vein: No evidence of thrombus. Normal compressibility, respiratory phasicity and response to augmentation. Popliteal Vein: No evidence of thrombus. Normal compressibility, respiratory phasicity and response to augmentation. Calf Veins: No evidence of thrombus. Normal compressibility and flow on color Doppler imaging. Other Findings:  None. IMPRESSION: No evidence of deep venous thrombosis in either lower extremity. Electronically Signed   By: Richarda Overlie M.D.   On: 12/03/2020 15:33    Medications:    aspirin  81 mg Oral Daily   atorvastatin  40 mg Oral Daily   diclofenac Sodium  2 g Topical TID AC & HS   enoxaparin (LOVENOX) injection  1 mg/kg Subcutaneous Q12H   furosemide  furosemide  60 mg Intravenous Daily   isosorbide-hydrALAZINE  1 tablet Oral TID   melatonin  5 mg Oral QHS   metoprolol succinate  25 mg Oral Daily   milk and molasses  1 enema Rectal Once   nystatin cream   Topical BID   senna-docusate  2 tablet Oral QHS   sodium chloride flush  3 mL Intravenous Q12H   sodium chloride flush  3 mL Intravenous Q12H   sodium chloride flush  3 mL Intravenous Q12H   Continuous Infusions:  sodium chloride     sodium chloride       LOS: 10 days   Joseph Art  [facility]   How to contact the [facility] Attending or Consulting provider 7A - 7P or covering provider during after hours 7P -7A, for this patient?  Check the care team in [facility] and look for a) attending/consulting [facility] provider listed and b) the [facility] team listed Log into [email or web address] and use [place]'s universal password to  access. If you do not have the password, please contact the hospital operator. Locate the [facility] provider you are looking for under [facility] and page to a number that you can be directly reached. If you still have difficulty reaching the provider, please page the [facility] (Director on Call) for the Hospitalists listed on amion for assistance.  12/05/2020, 1:25 PM

## 2020-12-05 NOTE — Progress Notes (Signed)
Progress Note  Patient Name: Evelyn Dunn Date of Encounter: 12/05/2020  [facility] Cardiologist: Yvonne Kendall, MD   Subjective   Breathing improved.  No chest pain reported.  Inpatient Medications    Scheduled Meds:  aspirin  81 mg Oral Daily   atorvastatin  40 mg Oral Daily   diclofenac Sodium  2 g Topical TID AC & HS   enoxaparin (LOVENOX) injection  1 mg/kg Subcutaneous Q12H   [START ON 12/06/2020] feeding supplement  237 mL Oral BID BM   furosemide  60 mg Intravenous Daily   isosorbide-hydrALAZINE  1 tablet Oral TID   melatonin  5 mg Oral QHS   metoprolol succinate  25 mg Oral Daily   milk and molasses  1 enema Rectal Once   nystatin cream   Topical BID   senna-docusate  2 tablet Oral QHS   sodium chloride flush  3 mL Intravenous Q12H   sodium chloride flush  3 mL Intravenous Q12H   sodium chloride flush  3 mL Intravenous Q12H   Continuous Infusions:  sodium chloride     sodium chloride     PRN Meds: sodium chloride, sodium chloride, acetaminophen, alum & mag hydroxide-simeth, benzonatate, HYDROcodone-acetaminophen, hydrOXYzine, lidocaine, magnesium hydroxide, ondansetron (ZOFRAN) IV, sodium chloride flush, sodium chloride flush   Vital Signs    Vitals:   12/05/20 1130 12/05/20 1145 12/05/20 1215 12/05/20 1236  BP: 131/68 112/69 123/64 121/70  Pulse: (!) 111 (!) 110 96 94  Resp: 18 (!) 21 19 18   Temp:   98.2 F (36.8 C) 99.1 F (37.3 C)  TempSrc:   Oral   SpO2: 90% 90% 92% 95%  Weight:      Height:        Intake/Output Summary (Last 24 hours) at 12/05/2020 1928 Last data filed at 12/05/2020 1700 Gross per 24 hour  Intake 840 ml  Output 2600 ml  Net -1760 ml   Last 3 Weights 12/05/2020 12/03/2020 12/02/2020  Weight (lbs) 200 lb 13.4 oz 200 lb 13.4 oz 200 lb 9.9 oz  Weight (kg) 91.1 kg 91.1 kg 91 kg      Telemetry    NSR with PAC's and PVC's - Personally Reviewed  ECG    No new tracing  Physical Exam   GEN: No acute distress.    Neck: JVP difficult to assess due to body habitus. Cardiac: RRR with 2/6 systolic murmur Respiratory: Mildly diminished breath sounds at both lung bases GI: Soft, nontender, non-distended  MS: Trace pretibial edema Neuro:  Nonfocal  Psych: Normal affect   Labs    High Sensitivity Troponin:   Recent Labs  Lab 11/25/20 1053 11/25/20 1327  TROPONINIHS 149* 134*      Chemistry Recent Labs  Lab 12/03/20 1014 12/04/20 0619 12/05/20 0507  NA 139 139 138  K 4.2 3.9 3.8  CL 103 105 105  CO2 29 25 24   GLUCOSE 115* 106* 109*  BUN 30* 30* 26*  CREATININE 1.57* 1.69* 1.28*  CALCIUM 9.3 8.7* 8.7*  GFRNONAA 35* 32* 45*  ANIONGAP 7 9 9      Hematology Recent Labs  Lab 11/29/20 0543 11/30/20 0708 12/04/20 0619  WBC 6.5 5.6 6.9  RBC 4.35 4.28 4.42  HGB 14.1 13.9 14.1  HCT 42.3 41.8 42.3  MCV 97.2 97.7 95.7  MCH 32.4 32.5 31.9  MCHC 33.3 33.3 33.3  RDW 13.6 13.4 13.8  PLT 148* 154 193    BNPNo results for input(s): BNP, PROBNP in the last  168 hours.   DDimer  Recent Labs  Lab 12/02/20 1858  DDIMER 1.73*     Radiology    CARDIAC CATHETERIZATION  Result Date: 12/05/2020 Conclusions: 1. Moderate to severe single-vessel coronary artery disease with 20-30% proximal and tandem 50-70% mid LAD stenoses.  There is mild disease involving the LCx and RCA.  Severity of cardiomyopathy is out of proportion to degree of coronary artery disease suggesting primarily non-ischemic etiology. 2. Moderate to severely elevated left heart filling pressure. 3. Severely elevated right heart and pulmonary artery pressures. 4. Moderately reduced Fick cardiac output/index. Recommendations: 1. Restart diuresis.  If creatinine worsens in the setting of diuresis, short-term inotropic support may be needed. 2. Optimize goal-directed medical therapy.  Defer restarting ACEI/ARB until tomorrow in order to monitor renal function. 3. Medical therapy and risk factor modification to prevent progression of  coronary artery disease.  Once the patient has been optimized from a heart failure standpoint, myocardial perfusion stress test should be considered to assess hemodynamic significance of LAD disease. 4. Obtain renal artery Doppler to exclude renal artery stenosis in the setting of worsening renal function with addition of ACEI/ARB. Yvonne Kendall, MD [facility]    Cardiac Studies   R/LHC (see above)  TTE (11/26/2020):  1. Left ventricular ejection fraction, by estimation, is 20 to 25%. The  left ventricle has severely decreased function. The left ventricle  demonstrates global hypokinesis. The left ventricular internal cavity size  was moderately dilated. There is mild  left ventricular hypertrophy. Left ventricular diastolic parameters are  indeterminate.   2. Right ventricular systolic function is moderately reduced. The right  ventricular size is normal.   3. Left atrial size was severely dilated.   4. Right atrial size was severely dilated.   5. The mitral valve is normal in structure. Moderate to severe mitral  valve regurgitation.   6. The aortic valve was not well visualized. Aortic valve regurgitation  is mild.   Patient Profile     70 y.o. female with history of hypertension and tobacco use, admitted with acute HFrEF and hypertensive emergency.  Assessment & Plan    Acute HFrEF: New dx with LVEF 20-25% this admission.  R/LHC today shows significantly elevated left and right heart filling pressures.  Diuresis and ARB have been held due to recent AKI.  Cath showed single vessel CAD with moderate to severe mid LAD disease; cardiomyopathy out of proportion to degree of CAD most consistent with primarily non-ischemic substrate. Start furosemide 60 mg daily. Continue metoprolol succinate 25 mg daily; defer adding ACEI/ARB pending follow-up creatinine and renal artery Doppler in the setting of recent AKI and difficult to control hypertension. Increase Bidil to 1 tablet TID. If  renal function worsens with diuresis and no evidence of RAS, the patient may need transfer to ICU for IV inotropes to facilitate diuresis in the setting of low-output heart failure and cardiorenal syndrome. Plan to add spironolactone and SGLT-2 inhibitor prior to discharge, if renal function allows. Chest TSH and ferritin; HIV negative on admission.  Coronary artery disease: Cath today with borderline severe mid LAD disease and non-obstructive CAD involving LCx and RCA. Aspirin 81 mg daily. High-intensity statin therapy. Once the patient has been optimized from a heart-failure standpoint, consider myocardial perfusion stress test for functional assessment of LAD disease.  Hypertension: BP labile but still elevated at times. Continue metoprolol succinate 25 mg daily. Increase BiDil to 1 tablet TID. Renal artery Doppler.  Consider rechallenging with ACEI/ARB if no evidence of  bilateral RAS and creatinine stable following cath/diuresis.    For questions or updates, please contact [facility] Please consult [email or web address] for contact info under [facility].    Signed, Yvonne Kendall, MD  12/05/2020, 7:28 PM

## 2020-12-05 NOTE — Progress Notes (Signed)
PT Cancellation Note  Patient Details Name: Evelyn Dunn MRN: [id number] DOB: 11/11/1950   Cancelled Treatment:    Reason Eval/Treat Not Completed: Patient at procedure or test/unavailable. Pt off floor for cardiac cath. PT on hold. Will check in at later date/time as available and medically appropriate.  Delphia Grates. Fairly IV, PT, DPT Physical Therapist- [place]  [facility]  12/05/2020, 8:47 AM

## 2020-12-06 ENCOUNTER — Inpatient Hospital Stay: Payer: Medicare PPO

## 2020-12-06 DIAGNOSIS — I2510 Atherosclerotic heart disease of native coronary artery without angina pectoris: Secondary | ICD-10-CM | POA: Diagnosis not present

## 2020-12-06 DIAGNOSIS — I5021 Acute systolic (congestive) heart failure: Secondary | ICD-10-CM | POA: Diagnosis not present

## 2020-12-06 LAB — BASIC METABOLIC PANEL
Anion gap: 8 (ref 5–15)
BUN: 23 mg/dL (ref 8–23)
CO2: 27 mmol/L (ref 22–32)
Calcium: 8.7 mg/dL — ABNORMAL LOW (ref 8.9–10.3)
Chloride: 103 mmol/L (ref 98–111)
Creatinine, Ser: 1.27 mg/dL — ABNORMAL HIGH (ref 0.44–1.00)
GFR, Estimated: 46 mL/min — ABNORMAL LOW (ref >60)
Glucose, Bld: 119 mg/dL — ABNORMAL HIGH (ref 70–99)
Potassium: 3.7 mmol/L (ref 3.5–5.1)
Sodium: 138 mmol/L (ref 135–145)

## 2020-12-06 LAB — FERRITIN: Ferritin: 71 ng/mL (ref 11–307)

## 2020-12-06 LAB — TSH: TSH: 6.831 u[IU]/mL — ABNORMAL HIGH (ref 0.350–4.500)

## 2020-12-06 LAB — T4, FREE: Free T4: 1.12 ng/dL (ref 0.61–1.12)

## 2020-12-06 MED ORDER — FUROSEMIDE 10 MG/ML IJ SOLN
60.0000 mg | Freq: Two times a day (BID) | INTRAMUSCULAR | Status: DC
  Administered 2020-12-06: 60 mg via INTRAVENOUS
  Filled 2020-12-06: qty 6

## 2020-12-06 MED ORDER — LOSARTAN POTASSIUM 25 MG PO TABS
25.0000 mg | ORAL_TABLET | Freq: Every day | ORAL | Status: DC
  Administered 2020-12-06 – 2020-12-07 (×2): 25 mg via ORAL
  Filled 2020-12-06 (×2): qty 1

## 2020-12-06 MED ORDER — ENOXAPARIN SODIUM 60 MG/0.6ML IJ SOSY
0.5000 mg/kg | PREFILLED_SYRINGE | INTRAMUSCULAR | Status: DC
  Administered 2020-12-06 – 2020-12-08 (×3): 50 mg via SUBCUTANEOUS
  Filled 2020-12-06 (×4): qty 0.5

## 2020-12-06 NOTE — TOC Progression Note (Addendum)
Transition of Care [facility]) - Progression Note    Patient Details  Name: Evelyn Dunn MRN: [id number] Date of Birth: [birth year]  Transition of Care [facility]) CM/SW Contact  Gildardo Griffes, [place] Phone Number: 12/06/2020, 9:47 AM  Clinical Narrative:     CSW continues to follow for discharge planning needs, has been set up with Home Health PT and RN through [place] [facility] [place]. Patient had declined any DME needs at this time. Scale was previously delivered to patient's bedside.   Pending medical readiness to dc.   Expected Discharge Plan: Home w Home Health Services Barriers to Discharge: Continued Medical Work up  Expected Discharge Plan and Services Expected Discharge Plan: Home w Home Health Services       Living arrangements for the past 2 months: Single Family Home                           HH Arranged: PT, RN [facility] Agency: [facility] Date [facility] Agency Contacted: 12/01/20 Time HH Agency Contacted: [phone] Representative spoke with at [facility] Agency: Brittney   Social Determinants of Health (SDOH) Interventions    Readmission Risk Interventions No flowsheet data found.

## 2020-12-06 NOTE — Progress Notes (Signed)
Occupational Therapy Treatment Patient Details Name: Evelyn Dunn MRN: [id number] DOB: [birth year] Today's Date: 12/06/2020    History of present illness 70 year old female with HTN, tobacco use, who comes to the hospital with shortness of breath.  This is been progressively getting worse over the last 4 days, worse with exertion and unable to lay flat.  Impression in the ED was acute on chronic diastolic CHF, she was placed on diuretics and was admitted to the hospital.  She was also significantly hypertensive.  Cardiology consulted as she was found to have a left bundle branch block and new onset systolic CHF.   OT comments  Upon entering the room, pt supine in bed and calling out for toileting needs. Pt exiting the bed without assistance and ambulating in room from bed >bathroom with min guard. Pt able to have BM this session and performs hygiene while seated. Pt performing LB clothing management in standing with close supervision. Pt then ambulating to sink for hand hygiene and grooming while standing with close supervision. Pt returning to bed at end of session.  Follow Up Recommendations  Home health OT;Supervision - Intermittent    Equipment Recommendations  None recommended by OT       Precautions / Restrictions Precautions Precautions: Fall Restrictions Weight Bearing Restrictions: No       Mobility Bed Mobility Overal bed mobility: Needs Assistance Bed Mobility: Sit to Supine;Supine to Sit     Supine to sit: Supervision Sit to supine: Supervision   General bed mobility comments: no physical assistance needed with min cuing for technique    Transfers Overall transfer level: Needs assistance Equipment used: 1 person hand held assist Transfers: Stand Pivot Transfers;Sit to/from Stand Sit to Stand: Supervision Stand pivot transfers: Min guard       General transfer comment: min HHA for safety    Balance Overall balance assessment: Needs  assistance Sitting-balance support: Feet supported Sitting balance-Leahy Scale: Good     Standing balance support: During functional activity Standing balance-Leahy Scale: Fair                             ADL either performed or assessed with clinical judgement   ADL Overall ADL's : Needs assistance/impaired     Grooming: Wash/dry hands;Wash/dry face;Brushing hair;Standing;Supervision/safety;Oral care                   Toilet Transfer: Supervision/safety;Regular Social worker and Hygiene: Supervision/safety;Sit to/from stand       Functional mobility during ADLs: Supervision/safety;Min guard General ADL Comments: without use of AD     Vision Baseline Vision/History: Wears glasses Patient Visual Report: No change from baseline            Cognition Arousal/Alertness: Awake/alert Behavior During Therapy: WFL for tasks assessed/performed Overall Cognitive Status: Within Functional Limits for tasks assessed                                                     Pertinent Vitals/ Pain       Pain Assessment: No/denies pain   Frequency  Min 2X/week        Progress Toward Goals  OT Goals(current goals can now be found in the care plan section)  Progress towards OT goals: Progressing toward goals  Acute Rehab OT Goals Patient Stated Goal: to go home OT Goal Formulation: With patient Time For Goal Achievement: 12/14/20 Potential to Achieve Goals: Good  Plan Discharge plan remains appropriate       AM-PAC OT "6 Clicks" Daily Activity     Outcome Measure   Help from another person eating meals?: None Help from another person taking care of personal grooming?: None Help from another person toileting, which includes using toliet, bedpan, or urinal?: A Little Help from another person bathing (including washing, rinsing, drying)?: A Little Help from another person to put on and taking off regular  upper body clothing?: None Help from another person to put on and taking off regular lower body clothing?: A Little 6 Click Score: 21    End of Session Equipment Utilized During Treatment: Rolling walker  OT Visit Diagnosis: Unsteadiness on feet (R26.81);Muscle weakness (generalized) (M62.81)   Activity Tolerance Patient tolerated treatment well   Patient Left in bed;with call bell/phone within reach;with bed alarm set   Nurse Communication Mobility status        Time: 0932-6712 OT Time Calculation (min): 23 min  Charges: OT General Charges $OT Visit: 1 Visit OT Treatments $Self Care/Home Management : 23-37 mins  Jackquline Denmark, MS, OTR/L , CBIS ascom [phone]  12/06/20, 3:24 PM

## 2020-12-06 NOTE — Progress Notes (Signed)
PT Cancellation Note  Patient Details Name: Evelyn Dunn MRN: [id number] DOB: [birth year]   Cancelled Treatment:    Reason Eval/Treat Not Completed: Patient at procedure or test/unavailable. Attempted to see pt ~9:45 this am, she was out of room for procedure.  Since that time pt was able to ambulate ~200 ft in the hallway with walker.     Malachi Pro, DPT 12/06/2020, 1:24 PM

## 2020-12-06 NOTE — Progress Notes (Signed)
Mobility Specialist - Progress Note   12/06/20 1200  Mobility  Activity Ambulated in hall  Level of Assistance Standby assist, set-up cues, supervision of patient - no hands on  Assistive Device Front wheel walker  Distance Ambulated (ft) 200 ft  Mobility Ambulated with assistance in hallway  Mobility Response Tolerated well  Mobility performed by Mobility specialist  $Mobility charge 1 Mobility    Pre-mobility: 88 HR, 93% SpO2 During mobility: 96 HR, 95% SpO2   Pt motivated for session. MinA to exit bed, no dizziness. Pt ambulated in hallway with supervision and RW. No LOB. Denied SOB on RA, O2 maintained the 90s. No rest breaks, denied fatigue. Pt motivated to ambulate later this evening, will attempt as time permits.    Filiberto Pinks Mobility Specialist 12/06/20, 12:30 PM

## 2020-12-06 NOTE — Progress Notes (Signed)
Mobility Specialist - Progress Note   12/06/20 1600  Mobility  Activity Ambulated in hall  Level of Assistance Standby assist, set-up cues, supervision of patient - no hands on  Assistive Device Front wheel walker  Distance Ambulated (ft) 200 ft  Mobility Ambulated with assistance in hallway  Mobility Response Tolerated well  Mobility performed by Mobility specialist  $Mobility charge 1 Mobility    Pt ambulated in hallway 2nd time this date. No LOB with RW. No dizziness. Denied SOB on RA.    Evelyn Dunn Mobility Specialist 12/06/20, 4:04 PM

## 2020-12-06 NOTE — Progress Notes (Signed)
Progress Note    Evelyn Dunn  JTT:[id number] DOB: May 22, 1951  DOA: 11/25/2020 PCP: Pcp, No    Brief Narrative:     Medical records reviewed and are as summarized below:  Evelyn Dunn is an 70 y.o. female  with HTN, tobacco use, who comes to the hospital with shortness of breath.  This is been progressively getting worse over the last 4 days, worse with exertion and unable to lay flat.  Impression in the ED was acute on chronic diastolic CHF, she was placed on diuretics and was admitted to the hospital.  She was also significantly hypertensive.  Cardiology consulted as she was found to have a left bundle branch block and new onset systolic CHF.  Heart cath done 6/20.  Plan per cardiology.   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 --  A 2D echo done on 6/11 showed EF 20-25% with severely decreased LV function.  RV function was moderately reduced also.   -Continue metoprolol --cards:  s/p cardiac cath  Conclusions: Moderate to severe single-vessel coronary artery disease with 20-30% proximal and tandem 50-70% mid LAD stenoses.  There is mild disease involving the LCx and RCA.  Severity of cardiomyopathy is out of proportion to degree of coronary artery disease suggesting primarily non-ischemic etiology. Moderate to severely elevated left heart filling pressure. Severely elevated right heart and pulmonary artery pressures. Moderately reduced Fick  cardiac output/index.   Recommendations: Restart diuresis.  If creatinine worsens in the setting of diuresis, short-term inotropic support may be needed. Optimize goal-directed medical therapy.  Defer restarting ACEI/ARB until tomorrow in order to monitor renal function. Medical therapy and risk factor modification to prevent progression of coronary artery disease.  Once the patient has been optimized from a heart failure standpoint, myocardial perfusion stress test should be considered to assess hemodynamic significance of LAD disease. Obtain renal artery Doppler to exclude renal artery stenosis in the setting of worsening renal function with addition of ACEI/ARB.     Hypertensive emergency-blood pressure now normalized  Tobacco use-cessation has been encouraged   Acute kidney injury on chronic kidney disease stage IIIa -creatinine is noted to be 1.3 on arrival, likely her baseline. -- RAD pending  Elevated TSH -check free t4  Elevated d dimer -V/Q scan unclear for PE (x ray with b/l opacities l>r) -duplex LE negative -doubt PE- recent CTA negative for PE   obesity Body mass index is 39.88 kg/m.   Family Communication/Anticipated D/C date and plan/Code Status   DVT prophylaxis: Lovenox ordered. Code Status: Full Code.  Disposition Plan: Status is: Inpatient  Remains inpatient appropriate because:Inpatient level of care appropriate due to severity of illness  Dispo: The patient is from: Home              Anticipated d/c is to: Home              Patient currently is not medically stable to d/c.- plan per cardiology   Difficult  to place patient No         Medical Consultants:   cards    Subjective:   Breathing better and asking when she might be discharged  Objective:    Vitals:   12/05/20 1236 12/05/20 2037 12/06/20 0427 12/06/20 0803  BP: 121/70 121/72 125/83 128/74  Pulse: 94 85 84 89  Resp: 18 20 20 16   Temp: 99.1 F (37.3 C) 98.8 F (37.1 C)  98.4 F (36.9 C) 97.6 F (36.4 C)  TempSrc:   Oral Oral  SpO2: 95% 97% 95% 96%  Weight:   98.9 kg   Height:        Intake/Output Summary (Last 24 hours) at 12/06/2020 12/08/2020 Last data filed at 12/06/2020 0430 Gross per 24 hour  Intake 1080 ml  Output 2300 ml  Net -1220 ml   Filed Weights   12/03/20 0542 12/05/20 0806 12/06/20 0427  Weight: 91.1 kg 91.1 kg 98.9 kg    Exam:  General: Appearance:    Obese female in no acute distress     Lungs:     respirations unlabored  Heart:    Normal heart rate.      Neurologic:   Awake, alert, oriented x 3. No apparent focal neurological           defect.          Data Reviewed:   I have personally reviewed following labs and imaging studies:  Labs: Labs show the following:   Basic Metabolic Panel: Recent Labs  Lab 12/02/20 0451 12/03/20 1014 12/04/20 0619 12/05/20 0507 12/06/20 0514  NA 137 139 139 138 138  K 4.7 4.2 3.9 3.8 3.7  CL 108 103 105 105 103  CO2 22 29 25 24 27   GLUCOSE 121* 115* 106* 109* 119*  BUN 39* 30* 30* 26* 23  CREATININE 1.45* 1.57* 1.69* 1.28* 1.27*  CALCIUM 8.7* 9.3 8.7* 8.7* 8.7*   GFR Estimated Creatinine Clearance: 45.9 mL/min (A) (by C-G formula based on SCr of 1.27 mg/dL (H)). Liver Function Tests: No results for input(s): AST, ALT, ALKPHOS, BILITOT, PROT, ALBUMIN in the last 168 hours. No results for input(s): LIPASE, AMYLASE in the last 168 hours. No results for input(s): AMMONIA in the last 168 hours. Coagulation profile No results for input(s): INR, PROTIME in the last 168 hours.  CBC: Recent Labs  Lab 11/30/20 0708 12/04/20 0619  WBC 5.6 6.9  HGB 13.9 14.1  HCT 41.8 42.3  MCV 97.7 95.7  PLT 154 193   Cardiac Enzymes: No results for input(s): CKTOTAL, CKMB, CKMBINDEX, TROPONINI in the last 168 hours. BNP (last 3 results) No results for input(s): PROBNP in the last 8760 hours. CBG: No results for input(s): GLUCAP in the last 168 hours. D-Dimer: No results for input(s):  DDIMER in the last 72 hours.  Hgb A1c: No results for input(s): HGBA1C in the last 72 hours. Lipid Profile: No results for input(s): CHOL, HDL, LDLCALC, TRIG, CHOLHDL, LDLDIRECT in the last 72 hours. Thyroid function studies: Recent Labs    12/06/20 0514  TSH 6.831*   Anemia work up: Recent Labs    12/06/20 0514  FERRITIN 71   Sepsis Labs: Recent Labs  Lab 11/30/20 0708 12/04/20 0619  WBC 5.6 6.9    Microbiology No results found for this or any previous visit (from the past 240 hour(s)).   Procedures and diagnostic studies:  CARDIAC CATHETERIZATION  Result Date: 12/05/2020 Conclusions: 1. Moderate to severe single-vessel coronary artery disease with 20-30%  proximal and tandem 50-70% mid LAD stenoses.  There is mild disease involving the LCx and RCA.  Severity of cardiomyopathy is out of proportion to degree of coronary artery disease suggesting primarily non-ischemic etiology. 2. Moderate to severely elevated left heart filling pressure. 3. Severely elevated right heart and pulmonary artery pressures. 4. Moderately reduced Fick cardiac output/index. Recommendations: 1. Restart diuresis.  If creatinine worsens in the setting of diuresis, short-term inotropic support may be needed. 2. Optimize goal-directed medical therapy.  Defer restarting ACEI/ARB until tomorrow in order to monitor renal function. 3. Medical therapy and risk factor modification to prevent progression of coronary artery disease.  Once the patient has been optimized from a heart failure standpoint, myocardial perfusion stress test should be considered to assess hemodynamic significance of LAD disease. 4. Obtain renal artery Doppler to exclude renal artery stenosis in the setting of worsening renal function with addition of ACEI/ARB. Yvonne Kendall, MD [facility]    Medications:    aspirin  81 mg Oral Daily   atorvastatin  40 mg Oral Daily   diclofenac Sodium  2 g Topical TID AC & HS   enoxaparin (LOVENOX)  injection  1 mg/kg Subcutaneous Q12H   feeding supplement  237 mL Oral BID BM   furosemide  60 mg Intravenous Daily   isosorbide-hydrALAZINE  1 tablet Oral TID   melatonin  5 mg Oral QHS   metoprolol succinate  25 mg Oral Daily   milk and molasses  1 enema Rectal Once   nystatin cream   Topical BID   senna-docusate  2 tablet Oral QHS   sodium chloride flush  3 mL Intravenous Q12H   sodium chloride flush  3 mL Intravenous Q12H   sodium chloride flush  3 mL Intravenous Q12H   Continuous Infusions:  sodium chloride     sodium chloride       LOS: 11 days   Joseph Art  [facility]   How to contact the [facility] Attending or Consulting provider 7A - 7P or covering provider during after hours 7P -7A, for this patient?  Check the care team in [facility] and look for a) attending/consulting [facility] provider listed and b) the [facility] team listed Log into [email or web address] and use [facility]'s universal password to access. If you do not have the password, please contact the hospital operator. Locate the [facility] provider you are looking for under [facility] and page to a number that you can be directly reached. If you still have difficulty reaching the provider, please page the [facility] (Director on Call) for the Hospitalists listed on amion for assistance.  12/06/2020, 9:27 AM

## 2020-12-06 NOTE — Progress Notes (Signed)
Progress Note  Patient Name: [name] Date of Encounter: 12/06/2020  Primary Cardiologist: Yvonne Kendall, MD  Subjective   Denies chest pain and shortness of breath. Slept well w/ hob mildly elevated.  Inpatient Medications    Scheduled Meds:  aspirin  81 mg Oral Daily   atorvastatin  40 mg Oral Daily   diclofenac Sodium  2 g Topical TID AC & HS   enoxaparin (LOVENOX) injection  1 mg/kg Subcutaneous Q12H   feeding supplement  237 mL Oral BID BM   furosemide  60 mg Intravenous Daily   isosorbide-hydrALAZINE  1 tablet Oral TID   melatonin  5 mg Oral QHS   metoprolol succinate  25 mg Oral Daily   milk and molasses  1 enema Rectal Once   nystatin cream   Topical BID   senna-docusate  2 tablet Oral QHS   sodium chloride flush  3 mL Intravenous Q12H   sodium chloride flush  3 mL Intravenous Q12H   sodium chloride flush  3 mL Intravenous Q12H   Continuous Infusions:  sodium chloride     sodium chloride     PRN Meds: sodium chloride, sodium chloride, acetaminophen, alum & mag hydroxide-simeth, benzonatate, HYDROcodone-acetaminophen, hydrOXYzine, lidocaine, magnesium hydroxide, ondansetron (ZOFRAN) IV, sodium chloride flush, sodium chloride flush   Vital Signs    Vitals:   12/05/20 1215 12/05/20 1236 12/05/20 2037 12/06/20 0427  BP: 123/64 121/70 121/72 125/83  Pulse: 96 94 85 84  Resp: 19 18 20 20   Temp: 98.2 F (36.8 C) 99.1 F (37.3 C) 98.8 F (37.1 C) 98.4 F (36.9 C)  TempSrc: Oral   Oral  SpO2: 92% 95% 97% 95%  Weight:    98.9 kg  Height:        Intake/Output Summary (Last 24 hours) at 12/06/2020 0724 Last data filed at 12/06/2020 0430 Gross per 24 hour  Intake 1080 ml  Output 2300 ml  Net -1220 ml   Filed Weights   12/03/20 0542 12/05/20 0806 12/06/20 0427  Weight: 91.1 kg 91.1 kg 98.9 kg    Physical Exam   GEN: Obese, in no acute distress.  HEENT: Grossly normal.  Neck: Supple, obese, mildly elevated JVP. No carotid bruits, or  masses. Cardiac: RRR.  I don't appreciate an MR murmur.  No clubbing, cyanosis, edema.  R radial cath site C/D/I with no signs of infections, bleeding, hematomas or bruit. Radials 2+, DP/PT 2+ and equal bilaterally.  Respiratory:  Respirations regular and unlabored, bibasilar crackles. GI: Obese, soft, nontender, nondistended, BS + x 4. MS: Moves all extremities - ambulated w/ PT. Skin: Warm and dry, no rash. Psych: Normal affect.  Labs    Chemistry Recent Labs  Lab 12/04/20 0619 12/05/20 0507 12/06/20 0514  NA 139 138 138  K 3.9 3.8 3.7  CL 105 105 103  CO2 25 24 27   GLUCOSE 106* 109* 119*  BUN 30* 26* 23  CREATININE 1.69* 1.28* 1.27*  CALCIUM 8.7* 8.7* 8.7*  GFRNONAA 32* 45* 46*  ANIONGAP 9 9 8      Hematology Recent Labs  Lab 11/30/20 0708 12/04/20 0619  WBC 5.6 6.9  RBC 4.28 4.42  HGB 13.9 14.1  HCT 41.8 42.3  MCV 97.7 95.7  MCH 32.5 31.9  MCHC 33.3 33.3  RDW 13.4 13.8  PLT 154 193    Cardiac Enzymes  Recent Labs  Lab 11/25/20 1053 11/25/20 1327  TROPONINIHS 149* 134*      DDimer  Recent 12/06/20  12/02/20 1858  DDIMER 1.73*     Lipids  Lab Results  Component Value Date   CHOL 162 11/25/2020   HDL 41 11/25/2020   LDLCALC 101 (H) 11/25/2020   TRIG 98 11/25/2020   CHOLHDL 4.0 11/25/2020    HbA1c  Lab Results  Component Value Date   HGBA1C 6.4 (H) 11/25/2020    Radiology    DG Chest 2 View  Result Date: 12/03/2020 CLINICAL DATA:  Shortness of breath EXAM: CHEST - 2 VIEW COMPARISON:  11/25/2020 FINDINGS: Cardiac shadow remains enlarged. Patient rotation to the right is noted further accentuating the mediastinal markings. Bibasilar airspace opacity is noted left slightly greater than right new from the prior exam. No bony abnormality is noted. IMPRESSION: Increasing bibasilar airspace opacities left greater than right. Electronically Signed   By: Alcide Clever M.D.   On: 12/03/2020 15:29   NM Pulmonary Perfusion  Addendum Date: 12/03/2020    ADDENDUM REPORT: 12/03/2020 15:47 ADDENDUM: This addendum was made after review of a chest radiograph and lower extremity venous duplex on 12/03/2020. There is volume loss in the right lung and there are patchy densities at the left lung base that could be contributing to the perfusion abnormalities. Based on these imaging findings, pulmonary embolism cannot be excluded but the study is equivocal. Findings discussed with Dr. Benjamine Mola on 12/03/2020 at 1540 hours. Electronically Signed   By: Richarda Overlie M.D.   On: 12/03/2020 15:47   Addendum Date: 12/03/2020   ADDENDUM REPORT: 12/03/2020 13:23 ADDENDUM: These results were called by telephone at the time of interpretation on 12/03/2020 at 1:22 pm to provider Dr. Benjamine Mola, who verbally acknowledged these results. Electronically Signed   By: Richarda Overlie M.D.   On: 12/03/2020 13:23   Result Date: 12/03/2020 CLINICAL DATA:  70 year old with suspected pulmonary embolism. Shortness of breath for 5 days. Chest radiograph and chest CT on 11/25/2020. EXAM: NUCLEAR MEDICINE PERFUSION LUNG SCAN TECHNIQUE: Perfusion images were obtained in multiple projections after intravenous injection of radiopharmaceutical. Ventilation scans intentionally deferred if perfusion scan and chest x-ray adequate for interpretation during COVID 19 epidemic. RADIOPHARMACEUTICALS:  3.97 mCi Tc-71m MAA IV COMPARISON:  Chest CT and chest radiograph 11/25/2020 FINDINGS: Abnormal perfusion in the right lung particularly along the anterior aspect of the mid and lower right lung. Evidence for a large wedge-shaped defect involving the anterior left lower lung. Most recent chest radiograph was on 11/25/2020. IMPRESSION: Abnormal perfusion in both lungs. Findings are suggestive for pulmonary embolism. Recommend updated chest radiograph to exclude significant lung abnormalities. Electronically Signed: By: Richarda Overlie M.D. On: 12/03/2020 13:18   US Venous Img Lower Bilateral (DVT)  Result Date: 12/03/2020 CLINICAL  DATA:  70 year old with abnormal perfusion on nuclear medicine examination. Findings raise concern for pulmonary embolism. Evaluate for lower extremity DVT. EXAM: BILATERAL LOWER EXTREMITY VENOUS DOPPLER ULTRASOUND TECHNIQUE: Gray-scale sonography with graded compression, as well as color Doppler and duplex ultrasound were performed to evaluate the lower extremity deep venous systems from the level of the common femoral vein and including the common femoral, femoral, profunda femoral, popliteal and calf veins including the posterior tibial, peroneal and gastrocnemius veins when visible. The superficial great saphenous vein was also interrogated. Spectral Doppler was utilized to evaluate flow at rest and with distal augmentation maneuvers in the common femoral, femoral and popliteal veins. COMPARISON:  None. FINDINGS: RIGHT LOWER EXTREMITY Common Femoral Vein: No evidence of thrombus. Normal compressibility, respiratory phasicity and response to augmentation. Saphenofemoral Junction: No evidence of thrombus. Normal  compressibility and flow on color Doppler imaging. Profunda Femoral Vein: No evidence of thrombus. Normal compressibility and flow on color Doppler imaging. Femoral Vein: No evidence of thrombus. Normal compressibility, respiratory phasicity and response to augmentation. Popliteal Vein: No evidence of thrombus. Normal compressibility, respiratory phasicity and response to augmentation. Calf Veins: No evidence of thrombus. Normal compressibility and flow on color Doppler imaging. Other Findings:  None. LEFT LOWER EXTREMITY Common Femoral Vein: No evidence of thrombus. Normal compressibility, respiratory phasicity and response to augmentation. Saphenofemoral Junction: No evidence of thrombus. Normal compressibility and flow on color Doppler imaging. Profunda Femoral Vein: No evidence of thrombus. Normal compressibility and flow on color Doppler imaging. Femoral Vein: No evidence of thrombus. Normal  compressibility, respiratory phasicity and response to augmentation. Popliteal Vein: No evidence of thrombus. Normal compressibility, respiratory phasicity and response to augmentation. Calf Veins: No evidence of thrombus. Normal compressibility and flow on color Doppler imaging. Other Findings:  None. IMPRESSION: No evidence of deep venous thrombosis in either lower extremity. Electronically Signed   By: Richarda Overlie M.D.   On: 12/03/2020 15:33   DG Abd Portable 1V  Result Date: 12/02/2020 CLINICAL DATA:  Abdominal pain and distension EXAM: PORTABLE ABDOMEN - 1 VIEW COMPARISON:  None. FINDINGS: Scattered large and small bowel gas is noted. No obstructive changes are seen. No free air is noted. Degenerative changes of lumbar spine are seen. IMPRESSION: No acute abnormality to correspond with the given clinical history. Electronically Signed   By: Alcide Clever M.D.   On: 12/02/2020 18:32    Telemetry    SR with PVC's - Personally Reviewed  Cardiac Studies   R/LHC 6.20.22  Diagnostic Dominance: Right    Right Heart Pressures RA (mean): 15 mmHg RV (S/EDP): 66/20 mmHg PA (S/D, mean): 66/34 (45) mmHg PCWP (mean): 28 mmHg  Ao sat: 93% PA sat: 66%  Fick CO: 3.6 L/min Fick CI: 1.9 L/min/m^2  PVR: 4.7 Wood units  _____________   2D Echocardiogram 6.11.2022   1. Left ventricular ejection fraction, by estimation, is 20 to 25%. The  left ventricle has severely decreased function. The left ventricle  demonstrates global hypokinesis. The left ventricular internal cavity size was moderately dilated. There is mild left ventricular hypertrophy. Left ventricular diastolic parameters are indeterminate.   2. Right ventricular systolic function is moderately reduced. The right ventricular size is normal.   3. Left atrial size was severely dilated.   4. Right atrial size was severely dilated.   5. The mitral valve is normal in structure. Moderate to severe mitral  valve regurgitation.   6. The  aortic valve was not well visualized. Aortic valve regurgitation is mild.   Patient Profile     70 y.o. female with history of HTN and tobacco use admitted with sudden onset of dyspnea and hypertensive emergency on 11/25/2020 who is being seen for acute HFrEF, moderate to severe mitral regurgitation, and LBBB.   Assessment & Plan    1. Acute HFrEF/Cardiomyopathy: Patient admitted 6/10 w/ dyspnea and hypertensive urgency. Found to be volume overloaded. She initially received IV Lasix, CT Contrast, BiDil, and Metoprolol and developed hypotension w/ subsequent rise in creat to 2.75 on 6/14. On 6/17, Creat 1.45 and since resuming oral torsemide and losartan increased from 1.45  1.57  1.69. Both were held in preparation for R and L heart cath.  Cath w/ moderate nonobs dzs and elevated R heart pressures.  She tolerated lasix 60mg  IV x 1 yesterday w/ good output and stable renal  fxn.  Net -1.2L as of yesterday with total urine output of 2.3L. Exam somewhat challenging related to body habitus, though she does have bibasilar crackles.  We will continue IV Lasix 60 mg as renal fxn allows.  Also adding back ARB today.  Cont beta-blocker and BiDil. Daily weights. Continue to monitor I&Os. If BP/renal fxn stable, can consider transitioning ARB to entresto tomorrow.  Will also need to consider MRA and SGLT2i going forward.  2. Moderate CAD: R/LHC showed moderate to severe single-vessel CAD with 20-30% proximal and tandem 50-70% mid LAD stenosis. Mild dz involving the LCx and RCA. No chest pain.  Cont med rx w/ asa, statin, ? blocker.  3. Moderate to Severe MR: In setting of LV dysfxn and volume overload.  PCWP of 20 w/ V wave of 23.  Will need f/u echo as outpt to assess for improvement following diuresis, though it is notable that echo showed sev dil LA.  4. AKI: BUN/Creat are stable today. Renal artery duplex w/o significant RAS.  Therefor, will resume low dose ARB.  5. Hypertensive emergency: BP stable on  beta-blocker and BiDil. Adding losartan in setting of above.  6. Demand Ischemia: Mild HsTrop in setting of above (149  13).  Cath w/ nonobstructive dzs.  Cont med rx  beta-blocker, asa, and statin.   7. Tobacco abuse: Smoking cessation advised.  8. L-sided rib pain: Denies pain this morning, likely MSK.   Signed, Nicolasa Ducking, NP  12/06/2020, 7:24 AM    For questions or updates, please contact   Please consult [email or web address] for contact info under Cardiology/STEMI.

## 2020-12-07 DIAGNOSIS — I5021 Acute systolic (congestive) heart failure: Secondary | ICD-10-CM | POA: Diagnosis not present

## 2020-12-07 LAB — CBC
HCT: 44.2 % (ref 36.0–46.0)
Hemoglobin: 14.7 g/dL (ref 12.0–15.0)
MCH: 31.1 pg (ref 26.0–34.0)
MCHC: 33.3 g/dL (ref 30.0–36.0)
MCV: 93.6 fL (ref 80.0–100.0)
Platelets: 246 10*3/uL (ref 150–400)
RBC: 4.72 MIL/uL (ref 3.87–5.11)
RDW: 13.3 % (ref 11.5–15.5)
WBC: 6.7 10*3/uL (ref 4.0–10.5)
nRBC: 0 % (ref 0.0–0.2)

## 2020-12-07 LAB — BASIC METABOLIC PANEL
Anion gap: 9 (ref 5–15)
BUN: 29 mg/dL — ABNORMAL HIGH (ref 8–23)
CO2: 27 mmol/L (ref 22–32)
Calcium: 8.7 mg/dL — ABNORMAL LOW (ref 8.9–10.3)
Chloride: 100 mmol/L (ref 98–111)
Creatinine, Ser: 1.44 mg/dL — ABNORMAL HIGH (ref 0.44–1.00)
GFR, Estimated: 39 mL/min — ABNORMAL LOW (ref >60)
Glucose, Bld: 132 mg/dL — ABNORMAL HIGH (ref 70–99)
Potassium: 3.7 mmol/L (ref 3.5–5.1)
Sodium: 136 mmol/L (ref 135–145)

## 2020-12-07 LAB — BRAIN NATRIURETIC PEPTIDE: B Natriuretic Peptide: 251 pg/mL — ABNORMAL HIGH (ref 0.0–100.0)

## 2020-12-07 MED ORDER — SACUBITRIL-VALSARTAN 49-51 MG PO TABS
1.0000 | ORAL_TABLET | Freq: Two times a day (BID) | ORAL | Status: DC
  Administered 2020-12-07 – 2020-12-09 (×4): 1 via ORAL
  Filled 2020-12-07 (×4): qty 1

## 2020-12-07 MED ORDER — DIPHENHYDRAMINE HCL 12.5 MG/5ML PO ELIX
25.0000 mg | ORAL_SOLUTION | Freq: Once | ORAL | Status: AC
  Administered 2020-12-07: 25 mg via ORAL
  Filled 2020-12-07 (×2): qty 10

## 2020-12-07 NOTE — Progress Notes (Signed)
Progress Note    Evelyn Dunn  UMP:[id number] DOB: 01/29/1951  DOA: 11/25/2020 PCP: Pcp, No    Brief Narrative:     Medical records reviewed and are as summarized below:  Evelyn Dunn is an 70 y.o. female  with HTN, tobacco use, who comes to the hospital with shortness of breath.  This is been progressively getting worse over the last 4 days, worse with exertion and unable to lay flat.  Impression in the ED was acute on chronic diastolic CHF, she was placed on diuretics and was admitted to the hospital.  She was also significantly hypertensive.  Cardiology consulted as she was found to have a left bundle branch block and new onset systolic CHF.  Heart cath done 6/20.  Plan per cardiology.   6/22-shortness of breath is improving.  Not at baseline but improved from admission.  No chest pain.  Good urine output   Assessment/Plan:   Principal Problem:   Acute CHF (congestive heart failure) (HCC) Active Problems:   Hypertensive urgency   Nicotine dependence   Elevated troponin   LBBB (left bundle branch block)   Acute HFrEF (heart failure with reduced ejection fraction) (HCC)   Primary hypertension   Hypertensive emergency   Coronary artery disease involving native coronary artery of native heart without angina pectoris   Acute systolic CHF--new Severe cardiomyopathy EF of 20 to 25% -patient was admitted to the hospital with fluid overload in the setting of acute systolic CHF. EF 20-25% with severely decreased LV function.  RV function was moderately reduced also.   --cards:  s/p cardiac cath  Conclusions: Moderate to severe single-vessel coronary artery disease with 20-30% proximal and tandem 50-70% mid LAD stenoses.  There is mild disease involving the LCx and RCA.  Severity of cardiomyopathy is out of proportion to degree of coronary artery disease suggesting primarily non-ischemic etiology. Moderate to severely elevated left heart filling pressure. Severely elevated  right heart and pulmonary artery pressures. Moderately reduced Fick cardiac output/index.   Recommendations: Restart diuresis.  If creatinine worsens in the setting of diuresis, short-term inotropic support may be needed. Optimize goal-directed medical therapy.  Defer restarting ACEI/ARB until tomorrow in order to monitor renal function. Medical therapy and risk factor modification to prevent progression of coronary artery disease.  Once the patient has been optimized from a heart failure standpoint, myocardial perfusion stress test should be considered to assess hemodynamic significance of LAD disease. Obtain renal artery Doppler to exclude renal artery stenosis in the setting of worsening renal function with addition of ACEI/ARB.   6/22-clinically improving but not at baseline.  Continue metoprolol XL, started on Entresto 49-51 mg twice daily BiDil, losartan stopped. Continue I's and O's, renal function.   Hypertensive emergency-blood pressure stable, low to normal. Continue Toprol, Entresto  Moderate to severe MR Per cardiology consider TEE as outpatient if EF stays low despite adequate GDMT  Tobacco use-was counseled about smoking cessation   Acute kidney injury on chronic kidney disease stage IIIa Renal function 1.44 today.  Mildly elevated from baseline. Continue to monitor closely Renal ultrasound diuretic duplex of renal arteries demonstrated no evidence of high-grade stenosis  Elevated TSH Free T4 normal Will need to follow-up with PCP as outpatient monitoring  Elevated d dimer -V/Q scan unclear for PE (x ray with b/l opacities l>r) -duplex LE negative -doubt PE- recent CTA negative for PE   obesity Body mass index is 35.69 kg/m.      DVT prophylaxis: Lovenox  Code Status: Full Code.  Disposition Plan: Status is: Inpatient  Remains inpatient appropriate because:Inpatient level of care appropriate due to severity of illness  Dispo: The patient is from: Home               Anticipated d/c is to: Home              Patient currently is not medically stable to d/c.- plan per cardiology   Difficult to place patient No         Medical Consultants:   cards    Subjective:   As above.  No chest pain or dizziness  Objective:    Vitals:   12/07/20 0645 12/07/20 0856 12/07/20 1129 12/07/20 1628  BP:  131/84 101/72 (!) 136/91  Pulse:  87 84 84  Resp:  16 16 18   Temp:  98.4 F (36.9 C) 98.4 F (36.9 C) 97.8 F (36.6 C)  TempSrc:  Oral  Oral  SpO2:  96% 94% 98%  Weight: 88.5 kg     Height:        Intake/Output Summary (Last 24 hours) at 12/07/2020 1727 Last data filed at 12/07/2020 1630 Gross per 24 hour  Intake 1080 ml  Output 5803 ml  Net -4723 ml   Filed Weights   12/05/20 0806 12/06/20 0427 12/07/20 0645  Weight: 91.1 kg 98.9 kg 88.5 kg    Exam: NAD, calm Scattered crackles at bases no wheezing Regular S1-S2 no gallops Soft benign positive bowel sounds No edema Awake and alert, grossly intact      Data Reviewed:   I have personally reviewed following labs and imaging studies:  Labs: Labs show the following:   Basic Metabolic Panel: Recent Labs  Lab 12/03/20 1014 12/04/20 0619 12/05/20 0507 12/06/20 0514 12/07/20 0506  NA 139 139 138 138 136  K 4.2 3.9 3.8 3.7 3.7  CL 103 105 105 103 100  CO2 29 25 24 27 27   GLUCOSE 115* 106* 109* 119* 132*  BUN 30* 30* 26* 23 29*  CREATININE 1.57* 1.69* 1.28* 1.27* 1.44*  CALCIUM 9.3 8.7* 8.7* 8.7* 8.7*   GFR Estimated Creatinine Clearance: 38.1 mL/min (A) (by C-G formula based on SCr of 1.44 mg/dL (H)). Liver Function Tests: No results for input(s): AST, ALT, ALKPHOS, BILITOT, PROT, ALBUMIN in the last 168 hours. No results for input(s): LIPASE, AMYLASE in the last 168 hours. No results for input(s): AMMONIA in the last 168 hours. Coagulation profile No results for input(s): INR, PROTIME in the last 168 hours.  CBC: Recent Labs  Lab 12/04/20 0619  12/07/20 0506  WBC 6.9 6.7  HGB 14.1 14.7  HCT 42.3 44.2  MCV 95.7 93.6  PLT 193 246   Cardiac Enzymes: No results for input(s): CKTOTAL, CKMB, CKMBINDEX, TROPONINI in the last 168 hours. BNP (last 3 results) No results for input(s): PROBNP in the last 8760 hours. CBG: No results for input(s): GLUCAP in the last 168 hours. D-Dimer: No results for input(s): DDIMER in the last 72 hours.  Hgb A1c: No results for input(s): HGBA1C in the last 72 hours. Lipid Profile: No results for input(s): CHOL, HDL, LDLCALC, TRIG, CHOLHDL, LDLDIRECT in the last 72 hours. Thyroid function studies: Recent Labs    12/06/20 0514  TSH 6.831*   Anemia work up: Recent Labs    12/06/20 0514  FERRITIN 71   Sepsis Labs: Recent Labs  Lab 12/04/20 0619 12/07/20 0506  WBC 6.9 6.7    Microbiology No results found for  this or any previous visit (from the past 240 hour(s)).   Procedures and diagnostic studies:  US RENAL ARTERY DUPLEX COMPLETE  Result Date: 12/06/2020 CLINICAL DATA:  70 year old female with a history of hypertension EXAM: RENAL/URINARY TRACT ULTRASOUND RENAL DUPLEX DOPPLER ULTRASOUND COMPARISON:  None. FINDINGS: Right Kidney: Length: 10.9 cm. Echogenicity within normal limits. No mass or hydronephrosis visualized. Left Kidney: Length: 9.7 cm. No hydronephrosis. Echogenicity symmetric to the right. 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 Bladder:  Unremarkable RENAL DUPLEX ULTRASOUND Right Renal Artery Velocities: Origin:  90 cm/sec Mid:  59 cm/sec Hilum:  30 cm/sec Interlobar:  19 cm/sec Arcuate:  17 cm/sec Left Renal Artery Velocities: Origin:  117 cm/sec Mid:  75 cm/sec Hilum:  69 cm/sec Interlobar:  22 cm/sec Arcuate:  15 cm/sec Aortic Velocity:  182 cm/sec Right Renal-Aortic Ratios: Origin: 0.5 Mid:  0.3 Hilum: 0.2 Interlobar: 0.1 Arcuate: 0.1 Left Renal-Aortic Ratios: Origin: 0.6 Mid: 0.4 Hilum: 0.4  Interlobar: 0.1 Arcuate: 0.1 IMPRESSION: Directed duplex of the renal arteries demonstrates no evidence of high-grade stenosis Signed, Yvone Neu. Reyne Dumas, RPVI [facility] Rad[facility] Electronically Signed   By: Gilmer Mor D.O.   On: 12/06/2020 11:52    Medications:    aspirin  81 mg Oral Daily   atorvastatin  40 mg Oral Daily   diclofenac Sodium  2 g Topical TID AC & HS   enoxaparin (LOVENOX) injection  0.5 mg/kg Subcutaneous Q24H   feeding supplement  237 mL Oral BID BM   melatonin  5 mg Oral QHS   metoprolol succinate  25 mg Oral Daily   milk and molasses  1 enema Rectal Once   nystatin cream   Topical BID   sacubitril-valsartan  1 tablet Oral BID   senna-docusate  2 tablet Oral QHS   sodium chloride flush  3 mL Intravenous Q12H   sodium chloride flush  3 mL Intravenous Q12H   sodium chloride flush  3 mL Intravenous Q12H   Continuous Infusions:  sodium chloride     sodium chloride       LOS: 12 days   Evelyn Dunn  [facility]  Time spent more than 35 minutes with more than 50% on COC   12/07/2020, 5:27 PM

## 2020-12-07 NOTE — Progress Notes (Signed)
   Heart Failure Nurse Navigator Note  Met with patient today, she denies any increasing shortness of breath but states that she is not sleeping well, does  not really know why.   By teach back method went over how she is going to take care of herself and the heart failure when she is discharged home.  She needed very little reinforced.  She states that she has been reading the heart failure teaching booklet.  She had no questions.  Made her aware that I am available anytime if she has any.   Pricilla Riffle RN CHFN

## 2020-12-07 NOTE — Progress Notes (Signed)
Pt requested that both side rails be raised.

## 2020-12-07 NOTE — Progress Notes (Signed)
   12/07/20 1144  Mobility  Activity Ambulated in hall  Level of Assistance Modified independent, requires aide device or extra time  Assistive Device Front wheel walker  Distance Ambulated (ft) 160 ft  Mobility Ambulated with assistance in hallway  Mobility Response Tolerated well  Mobility performed by Mobility specialist  $Mobility charge 1 Mobility    Mobility Specialist - Progress Note   Pre-mobility: HR 108, SpO2 97% During mobility: HR 109, SpO2 98% Post-mobility: HR 86, SPO2 97%  Pt was eager to ambulate. Ambulated in hallway and tolerated well. No complaints. Plan to ambulate in afternoon.  Filiberto Pinks Mobility Specialist 12/07/20, 11:52 AM

## 2020-12-07 NOTE — Progress Notes (Signed)
Progress Note  Patient Name: Evelyn Dunn Date of Encounter: 12/07/2020  Primary Cardiologist: Yvonne Kendall, MD  Subjective   Feels well this morning.  Denies chest pain or dyspnea.  Ambulated quite a bit yesterday without difficulty.  BUN and creatinine up slightly this morning and Lasix on hold.  Inpatient Medications    Scheduled Meds:  aspirin  81 mg Oral Daily   atorvastatin  40 mg Oral Daily   diclofenac Sodium  2 g Topical TID AC & HS   enoxaparin (LOVENOX) injection  0.5 mg/kg Subcutaneous Q24H   feeding supplement  237 mL Oral BID BM   isosorbide-hydrALAZINE  1 tablet Oral TID   losartan  25 mg Oral Daily   melatonin  5 mg Oral QHS   metoprolol succinate  25 mg Oral Daily   milk and molasses  1 enema Rectal Once   nystatin cream   Topical BID   senna-docusate  2 tablet Oral QHS   sodium chloride flush  3 mL Intravenous Q12H   sodium chloride flush  3 mL Intravenous Q12H   sodium chloride flush  3 mL Intravenous Q12H   Continuous Infusions:  sodium chloride     sodium chloride     PRN Meds: sodium chloride, sodium chloride, acetaminophen, alum & mag hydroxide-simeth, benzonatate, HYDROcodone-acetaminophen, hydrOXYzine, lidocaine, magnesium hydroxide, ondansetron (ZOFRAN) IV, sodium chloride flush, sodium chloride flush   Vital Signs    Vitals:   12/06/20 2102 12/07/20 0346 12/07/20 0645 12/07/20 0856  BP: 108/79 124/88  131/84  Pulse: 88 85  87  Resp: 14 14  16   Temp: 98.3 F (36.8 C) 98 F (36.7 C)  98.4 F (36.9 C)  TempSrc: Oral Oral  Oral  SpO2: 98% 99%  96%  Weight:   88.5 kg   Height:        Intake/Output Summary (Last 24 hours) at 12/07/2020 1014 Last data filed at 12/07/2020 0500 Gross per 24 hour  Intake 1080 ml  Output 5803 ml  Net -4723 ml   Filed Weights   12/05/20 0806 12/06/20 0427 12/07/20 0645  Weight: 91.1 kg 98.9 kg 88.5 kg    Physical Exam   GEN: Well nourished, well developed, in no acute distress.  HEENT: Grossly  normal.  Neck: Supple, no JVD, carotid bruits, or masses. Cardiac: RRR, no murmurs, rubs, or gallops. No clubbing, cyanosis, edema.  Radials 2+, DP/PT 2+ and equal bilaterally.  Right radial cath site without bleeding, bruit, or hematoma. Respiratory:  Respirations regular and unlabored, clear to auscultation bilaterally. GI: Soft, nontender, nondistended, BS + x 4. MS: no deformity or atrophy. Skin: warm and dry, no rash. Neuro:  Strength and sensation are intact. Psych: AAOx3.  Normal affect.  Labs    Chemistry Recent Labs  Lab 12/05/20 0507 12/06/20 0514 12/07/20 0506  NA 138 138 136  K 3.8 3.7 3.7  CL 105 103 100  CO2 24 27 27   GLUCOSE 109* 119* 132*  BUN 26* 23 29*  CREATININE 1.28* 1.27* 1.44*  CALCIUM 8.7* 8.7* 8.7*  GFRNONAA 45* 46* 39*  ANIONGAP 9 8 9      Hematology Recent Labs  Lab 12/04/20 0619 12/07/20 0506  WBC 6.9 6.7  RBC 4.42 4.72  HGB 14.1 14.7  HCT 42.3 44.2  MCV 95.7 93.6  MCH 31.9 31.1  MCHC 33.3 33.3  RDW 13.8 13.3  PLT 193 246    Cardiac Enzymes  Recent Labs  Lab 11/25/20 1053 11/25/20 1327  TROPONINIHS  149* 134*      BNP Recent Labs  Lab 12/07/20 0506  BNP 251.0*     DDimer  Recent Labs  Lab 12/02/20 1858  DDIMER 1.73*     Lipids  Lab Results  Component Value Date   CHOL 162 11/25/2020   HDL 41 11/25/2020   LDLCALC 101 (H) 11/25/2020   TRIG 98 11/25/2020   CHOLHDL 4.0 11/25/2020    HbA1c  Lab Results  Component Value Date   HGBA1C 6.4 (H) 11/25/2020    Radiology    DG Chest 2 View  Result Date: 12/03/2020 CLINICAL DATA:  Shortness of breath EXAM: CHEST - 2 VIEW COMPARISON:  11/25/2020 FINDINGS: Cardiac shadow remains enlarged. Patient rotation to the right is noted further accentuating the mediastinal markings. Bibasilar airspace opacity is noted left slightly greater than right new from the prior exam. No bony abnormality is noted. IMPRESSION: Increasing bibasilar airspace opacities left greater than  right. Electronically Signed   By: Alcide Clever M.D.   On: 12/03/2020 15:29   US Venous Img Lower Bilateral (DVT)  Result Date: 12/03/2020 CLINICAL DATA:  70 year old with abnormal perfusion on nuclear medicine examination. Findings raise concern for pulmonary embolism. Evaluate for lower extremity DVT. EXAM: BILATERAL LOWER EXTREMITY VENOUS DOPPLER ULTRASOUND TECHNIQUE: Gray-scale sonography with graded compression, as well as color Doppler and duplex ultrasound were performed to evaluate the lower extremity deep venous systems from the level of the common femoral vein and including the common femoral, femoral, profunda femoral, popliteal and calf veins including the posterior tibial, peroneal and gastrocnemius veins when visible. The superficial great saphenous vein was also interrogated. Spectral Doppler was utilized to evaluate flow at rest and with distal augmentation maneuvers in the common femoral, femoral and popliteal veins. COMPARISON:  None. FINDINGS: RIGHT LOWER EXTREMITY Common Femoral Vein: No evidence of thrombus. Normal compressibility, respiratory phasicity and response to augmentation. Saphenofemoral Junction: No evidence of thrombus. Normal compressibility and flow on color Doppler imaging. Profunda Femoral Vein: No evidence of thrombus. Normal compressibility and flow on color Doppler imaging. Femoral Vein: No evidence of thrombus. Normal compressibility, respiratory phasicity and response to augmentation. Popliteal Vein: No evidence of thrombus. Normal compressibility, respiratory phasicity and response to augmentation. Calf Veins: No evidence of thrombus. Normal compressibility and flow on color Doppler imaging. Other Findings:  None. LEFT LOWER EXTREMITY Common Femoral Vein: No evidence of thrombus. Normal compressibility, respiratory phasicity and response to augmentation. Saphenofemoral Junction: No evidence of thrombus. Normal compressibility and flow on color Doppler imaging. Profunda  Femoral Vein: No evidence of thrombus. Normal compressibility and flow on color Doppler imaging. Femoral Vein: No evidence of thrombus. Normal compressibility, respiratory phasicity and response to augmentation. Popliteal Vein: No evidence of thrombus. Normal compressibility, respiratory phasicity and response to augmentation. Calf Veins: No evidence of thrombus. Normal compressibility and flow on color Doppler imaging. Other Findings:  None. IMPRESSION: No evidence of deep venous thrombosis in either lower extremity. Electronically Signed   By: Richarda Overlie M.D.   On: 12/03/2020 15:33   US RENAL ARTERY DUPLEX COMPLETE  Result Date: 12/06/2020 CLINICAL DATA:  70 year old female with a history of hypertension EXAM: RENAL/URINARY TRACT ULTRASOUND RENAL DUPLEX DOPPLER ULTRASOUND COMPARISON:  None. FINDINGS: Right Kidney: Length: 10.9 cm. Echogenicity within normal limits. No mass or hydronephrosis visualized. Left Kidney: Length: 9.7 cm. No hydronephrosis. Echogenicity symmetric to the right. 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 Bladder:  Unremarkable RENAL DUPLEX  ULTRASOUND Right Renal Artery Velocities: Origin:  90 cm/sec Mid:  59 cm/sec Hilum:  30 cm/sec Interlobar:  19 cm/sec Arcuate:  17 cm/sec Left Renal Artery Velocities: Origin:  117 cm/sec Mid:  75 cm/sec Hilum:  69 cm/sec Interlobar:  22 cm/sec Arcuate:  15 cm/sec Aortic Velocity:  182 cm/sec Right Renal-Aortic Ratios: Origin: 0.5 Mid:  0.3 Hilum: 0.2 Interlobar: 0.1 Arcuate: 0.1 Left Renal-Aortic Ratios: Origin: 0.6 Mid: 0.4 Hilum: 0.4 Interlobar: 0.1 Arcuate: 0.1 IMPRESSION: Directed duplex of the renal arteries demonstrates no evidence of high-grade stenosis Signed, Yvone Neu. Reyne Dumas, RPVI [facility] Radiology Electronically Signed   By: Gilmer Mor D.O.   On: 12/06/2020 11:52    Telemetry    RSR, sinus tachycardia -  Personally Reviewed  Cardiac Studies   [facility] 6.20.22   Diagnostic Dominance: Right      Right Heart Pressures RA (mean): 15 mmHg RV (S/EDP): 66/20 mmHg PA (S/D, mean): 66/34 (45) mmHg PCWP (mean): 28 mmHg  Ao sat: 93% PA sat: 66%  Fick CO: 3.6 L/min Fick CI: 1.9 L/min/m^2  PVR: 4.7 Wood units  _____________    2D Echocardiogram 6.11.2022    1. Left ventricular ejection fraction, by estimation, is 20 to 25%. The  left ventricle has severely decreased function. The left ventricle  demonstrates global hypokinesis. The left ventricular internal cavity size was moderately dilated. There is mild left ventricular hypertrophy. Left ventricular diastolic parameters are indeterminate.   2. Right ventricular systolic function is moderately reduced. The right ventricular size is normal.   3. Left atrial size was severely dilated.   4. Right atrial size was severely dilated.   5. The mitral valve is normal in structure. Moderate to severe mitral  valve regurgitation.   6. The aortic valve was not well visualized. Aortic valve regurgitation is mild.    Patient Profile     70 y.o. female with history of HTN and tobacco use admitted with sudden onset of dyspnea and hypertensive emergency on 11/25/2020 who is being seen for acute HFrEF, moderate to severe mitral regurgitation, and LBBB.   Assessment & Plan    1. Acute HFrEF/Cardiomyopathy: Patient admitted 6/10 w/ dyspnea and hypertensive urgency. Found to be volume overloaded. She initially received IV Lasix, CT Contrast, BiDil, and Metoprolol and developed hypotension w/ subsequent rise in creat to 2.75 on 6/14. On 6/17, Creat 1.45 and since resuming oral torsemide and losartan increased from 1.45  1.57  1.69. Both were held in preparation for R and L heart cath.  Cath w/ moderate nonobs dzs and elevated R heart pressures.  Post cath she received Lasix 60 mg IV and this was increased to twice daily yesterday.  -4.9 L yesterday and weight  down to 88.5 kg.  She is euvolemic on examination today.  Her BUN and creatinine are up slightly at 29 and 1.44.  Lasix held this morning.  Will discontinue losartan and BiDil in order to simplify regimen and add Entresto 49-51 mg twice daily.  Continue beta-blocker therapy.  Follow-up basic metabolic panel in the a.m. and if stable, she will likely be ready for discharge.  She was not previously on a diuretic at home and would likely need at least Lasix 40 mg daily.  We can consider MRA and SGLT2 inhibitor in the outpatient setting.  We discussed the importance of daily weights, sodium restriction, medication compliance, and symptom reporting and she verbalizes understanding.   2.  Moderate coronary artery disease: Right  and left heart catheterization on June 28 showed moderate to severe single-vessel CAD with a 20 to 30% proximal and tandem 50 to 70% mid LAD stenoses.  She had mild disease in the circumflex and RCA.  No chest pain.  Continue medical therapy with aspirin, statin, and beta-blocker.  3.  Moderate to severe mitral regurgitation: In the setting of LV dysfunction volume overload.  PCWP of 20 with a V wave of 23 at the time of catheterization.  Will need follow-up echo as outpatient to assess for improvement following diuresis, though it is notable that echo here showed severely dilated left atrium.  4.  Acute kidney injury: BUN and creatinine up slightly today in the setting of diuresis.  Holding diuretics.  Follow-up in a.m.  5.  Hypertensive urgency: Blood pressure has been stable on beta-blocker, BiDil, diuretic, and ARB.  As above, looking to simplify regimen she was on no medications prior to admission.  We will discontinue losartan and BiDil and add Entresto 49-51 mg twice daily.  Continue beta-blocker.  6.  Demand ischemia: Mild high-sensitivity troponin elevation to a peak of 149.  Cath with nonobstructive disease.  Continue medical therapy with beta-blocker, aspirin, and statin.  7.   Tobacco abuse: Smoking cessation advised.  8.  Left-sided rib pain: Resolved.  9.  ? OSA:  apnea alarms on tele overnight.  Will need outpt sleep study.  Signed, Nicolasa Ducking, NP  12/07/2020, 10:14 AM    For questions or updates, please contact   Please consult [email or web address] for contact info under Cardiology/STEMI.

## 2020-12-08 LAB — BASIC METABOLIC PANEL
Anion gap: 8 (ref 5–15)
BUN: 28 mg/dL — ABNORMAL HIGH (ref 8–23)
CO2: 26 mmol/L (ref 22–32)
Calcium: 8.9 mg/dL (ref 8.9–10.3)
Chloride: 102 mmol/L (ref 98–111)
Creatinine, Ser: 1.32 mg/dL — ABNORMAL HIGH (ref 0.44–1.00)
GFR, Estimated: 44 mL/min — ABNORMAL LOW (ref >60)
Glucose, Bld: 136 mg/dL — ABNORMAL HIGH (ref 70–99)
Potassium: 3.8 mmol/L (ref 3.5–5.1)
Sodium: 136 mmol/L (ref 135–145)

## 2020-12-08 MED ORDER — ISOSORB DINITRATE-HYDRALAZINE 20-37.5 MG PO TABS
1.0000 | ORAL_TABLET | Freq: Three times a day (TID) | ORAL | Status: DC
  Administered 2020-12-08 – 2020-12-09 (×3): 1 via ORAL
  Filled 2020-12-08 (×5): qty 1

## 2020-12-08 NOTE — Care Management Important Message (Signed)
Important Message  Patient Details  Name: KAFI DOTTER MRN: [id number] Date of Birth: 1951/02/06   Medicare Important Message Given:  Yes     Johnell Comings 12/08/2020, 2:12 PM

## 2020-12-08 NOTE — Progress Notes (Signed)
PT Cancellation Note  Patient Details Name: Evelyn Dunn MRN: [id number] DOB: 02/04/1951   Cancelled Treatment:    Reason Eval/Treat Not Completed: Patient declined, no reason specified Attempted to see pt this afternoon.  Reports she had recently work with OT taking a shower and states she felt very confident with this.  She also confirms that she has been doing some regular out of room walking the last few days again with good confidence.  She pleasantly defers PT today and reports she is eager to d/c home tomorrow.   Malachi Pro, DPT 12/08/2020, 5:53 PM

## 2020-12-08 NOTE — Progress Notes (Signed)
Occupational Therapy Treatment Patient Details Name: YVONNE STOPHER MRN: [id number] DOB: 1951-06-01 Today's Date: 12/08/2020    History of present illness 70 year old female with HTN, tobacco use, who comes to the hospital with shortness of breath.  This is been progressively getting worse over the last 4 days, worse with exertion and unable to lay flat.  Impression in the ED was acute on chronic diastolic CHF, she was placed on diuretics and was admitted to the hospital.  She was also significantly hypertensive.  Cardiology consulted as she was found to have a left bundle branch block and new onset systolic CHF.   OT comments  Upon entering the room, pt supine in bed and agreeable to OT intervention. MD cleared pt for shower today and RN covered IV on L UE. Pt standing with supervision and ambulating into bathroom with close supervision. Pt needing min cuing for safety awareness. Pt doffing gown and mesh underwear and stepping into shower with min guard. Pt sitting on 3 in 1 for shower with min guard when standing to wash buttocks and peri area. Pt exiting the shower and sitting on EOB for drying and donning of clean clothing. Pt returning to bed secondary to fatigue.All needs within reach. Pt continues to benefit from OT intervention.   Follow Up Recommendations  Home health OT;Supervision - Intermittent    Equipment Recommendations  None recommended by OT       Precautions / Restrictions Precautions Precautions: Fall Restrictions Weight Bearing Restrictions: No       Mobility Bed Mobility Overal bed mobility: Needs Assistance Bed Mobility: Sit to Supine;Supine to Sit     Supine to sit: Supervision Sit to supine: Supervision        Transfers Overall transfer level: Needs assistance Equipment used: 1 person hand held assist Transfers: Stand Pivot Transfers;Sit to/from Stand Sit to Stand: Supervision Stand pivot transfers: Supervision            Balance Overall balance  assessment: Needs assistance Sitting-balance support: Feet supported Sitting balance-Leahy Scale: Good     Standing balance support: During functional activity Standing balance-Leahy Scale: Fair                             ADL either performed or assessed with clinical judgement   ADL Overall ADL's : Needs assistance/impaired     Grooming: Wash/dry hands;Wash/dry face;Brushing hair;Standing;Supervision/safety;Oral care   Upper Body Bathing: Supervision/ safety;Sitting   Lower Body Bathing: Min guard;Sit to/from stand   Upper Body Dressing : Supervision/safety;Sitting   Lower Body Dressing: Min guard;Sit to/from stand                       Vision Baseline Vision/History: Wears glasses Patient Visual Report: No change from baseline            Cognition Arousal/Alertness: Awake/alert Behavior During Therapy: WFL for tasks assessed/performed Overall Cognitive Status: Within Functional Limits for tasks assessed                                                     Pertinent Vitals/ Pain       Pain Assessment: No/denies pain   Frequency  Min 2X/week        Progress Toward Goals  OT Goals(current goals can now  be found in the care plan section)  Progress towards OT goals: Progressing toward goals  Acute Rehab OT Goals Patient Stated Goal: to go home OT Goal Formulation: With patient Time For Goal Achievement: 12/14/20 Potential to Achieve Goals: Good  Plan Discharge plan remains appropriate       AM-PAC OT "6 Clicks" Daily Activity     Outcome Measure   Help from another person eating meals?: None Help from another person taking care of personal grooming?: None Help from another person toileting, which includes using toliet, bedpan, or urinal?: A Little Help from another person bathing (including washing, rinsing, drying)?: A Little Help from another person to put on and taking off regular upper body clothing?:  None Help from another person to put on and taking off regular lower body clothing?: A Little 6 Click Score: 21    End of Session Equipment Utilized During Treatment: Rolling walker  OT Visit Diagnosis: Unsteadiness on feet (R26.81);Muscle weakness (generalized) (M62.81)   Activity Tolerance Patient tolerated treatment well   Patient Left in bed;with call bell/phone within reach;with bed alarm set   Nurse Communication Mobility status        Time: 2263-3354 OT Time Calculation (min): 30 min  Charges: OT General Charges $OT Visit: 1 Visit OT Treatments $Self Care/Home Management : 23-37 mins  Jackquline Denmark, MS, OTR/L , CBIS ascom [phone]  12/08/20, 4:16 PM

## 2020-12-08 NOTE — Progress Notes (Addendum)
Progress Note  Patient Name: Evelyn Dunn Date of Encounter: 12/08/2020  [facility] Cardiologist:Dr. End  Subjective  Long discussion today, family in the room, Reports that she walked around the nursing unit yesterday with no symptoms, she has expressed that she would like to go home She denies shortness of breath, sitting up in bed eating, does not want to sit in a chair to eat Medications changed yesterday, BiDil held, started on moderate dose Entresto  Inpatient Medications    Scheduled Meds:  aspirin  81 mg Oral Daily   atorvastatin  40 mg Oral Daily   diclofenac Sodium  2 g Topical TID AC & HS   enoxaparin (LOVENOX) injection  0.5 mg/kg Subcutaneous Q24H   feeding supplement  237 mL Oral BID BM   melatonin  5 mg Oral QHS   metoprolol succinate  25 mg Oral Daily   nystatin cream   Topical BID   sacubitril-valsartan  1 tablet Oral BID   senna-docusate  2 tablet Oral QHS   sodium chloride flush  3 mL Intravenous Q12H   sodium chloride flush  3 mL Intravenous Q12H   sodium chloride flush  3 mL Intravenous Q12H   Continuous Infusions:  sodium chloride     sodium chloride     PRN Meds: sodium chloride, sodium chloride, acetaminophen, alum & mag hydroxide-simeth, benzonatate, HYDROcodone-acetaminophen, hydrOXYzine, lidocaine, magnesium hydroxide, ondansetron (ZOFRAN) IV, sodium chloride flush, sodium chloride flush   Vital Signs    Vitals:   12/07/20 2015 12/08/20 0357 12/08/20 0555 12/08/20 0732  BP: 124/87  129/82 (!) 142/87  Pulse: 87  82 77  Resp: 20  18 18   Temp: 98.2 F (36.8 C)  98.1 F (36.7 C) (!) 97.3 F (36.3 C)  TempSrc: Oral  Oral Oral  SpO2: 97%  96% 94%  Weight:  90.7 kg    Height:        Intake/Output Summary (Last 24 hours) at 12/08/2020 12/10/2020 Last data filed at 12/07/2020 1813 Gross per 24 hour  Intake 480 ml  Output 1000 ml  Net -520 ml   Last 3 Weights 12/08/2020 12/07/2020 12/06/2020  Weight (lbs) 199 lb 15.3 oz 195 lb 1.7 oz 218 lb  0.6 oz  Weight (kg) 90.7 kg 88.5 kg 98.9 kg      Telemetry    Normal sinus rhythm- Personally Reviewed  ECG    - Personally Reviewed  Physical Exam   Constitutional:  oriented to person, place, and time. No distress.  HENT:  Head: Grossly normal Eyes:  no discharge. No scleral icterus.  Neck:  JVD 10, no carotid bruits  Cardiovascular: Regular rate and rhythm, no murmurs appreciated Pulmonary/Chest: Clear to auscultation bilaterally, no wheezes or rails Abdominal: Soft.  no distension.  no tenderness.  Musculoskeletal: Normal range of motion Neurological:  normal muscle tone. Coordination normal. No atrophy Skin: Skin warm and dry Psychiatric: normal affect, pleasant    Labs    High Sensitivity Troponin:   Recent Labs  Lab 11/25/20 1053 11/25/20 1327  TROPONINIHS 149* 134*      Chemistry Recent Labs  Lab 12/06/20 0514 12/07/20 0506 12/08/20 0516  NA 138 136 136  K 3.7 3.7 3.8  CL 103 100 102  CO2 27 27 26   GLUCOSE 119* 132* 136*  BUN 23 29* 28*  CREATININE 1.27* 1.44* 1.32*  CALCIUM 8.7* 8.7* 8.9  GFRNONAA 46* 39* 44*  ANIONGAP 8 9 8      Hematology Recent Labs  Lab  12/04/20 0619 12/07/20 0506  WBC 6.9 6.7  RBC 4.42 4.72  HGB 14.1 14.7  HCT 42.3 44.2  MCV 95.7 93.6  MCH 31.9 31.1  MCHC 33.3 33.3  RDW 13.8 13.3  PLT 193 246    BNP Recent Labs  Lab 12/07/20 0506  BNP 251.0*     DDimer  Recent Labs  Lab 12/02/20 1858  DDIMER 1.73*     Radiology    US RENAL ARTERY DUPLEX COMPLETE  Result Date: 12/06/2020 CLINICAL DATA:  70 year old female with a history of hypertension EXAM: RENAL/URINARY TRACT ULTRASOUND RENAL DUPLEX DOPPLER ULTRASOUND COMPARISON:  None. FINDINGS: Right Kidney: Length: 10.9 cm. Echogenicity within normal limits. No mass or hydronephrosis visualized. Left Kidney: Length: 9.7 cm. No hydronephrosis. Echogenicity symmetric to the right. 13 mm anechoic cyst with through transmission and no internal flow ir complexity  compatible with Bosniak 1 cyst. Bladder:  Unremarkable RENAL DUPLEX ULTRASOUND Right Renal Artery Velocities: Origin:  90 cm/sec Mid:  59 cm/sec Hilum:  30 cm/sec Interlobar:  19 cm/sec Arcuate:  17 cm/sec Left Renal Artery Velocities: Origin:  117 cm/sec Mid:  75 cm/sec Hilum:  69 cm/sec Interlobar:  22 cm/sec Arcuate:  15 cm/sec Aortic Velocity:  182 cm/sec Right Renal-Aortic Ratios: Origin: 0.5 Mid:  0.3 Hilum: 0.2 Interlobar: 0.1 Arcuate: 0.1 Left Renal-Aortic Ratios: Origin: 0.6 Mid: 0.4 Hilum: 0.4 Interlobar: 0.1 Arcuate: 0.1 IMPRESSION: Directed duplex of the renal arteries demonstrates no evidence of high-grade stenosis Signed, Yvone Neu. Reyne Dumas, RPVI [facility] Electronically Signed   By: Gilmer Mor D.O.   On: 12/06/2020 11:52    Cardiac Studies   Echo  1. Left ventricular ejection fraction, by estimation, is 20 to 25%. The  left ventricle has severely decreased function. The left ventricle  demonstrates global hypokinesis. The left ventricular internal cavity size  was moderately dilated. There is mild  left ventricular hypertrophy. Left ventricular diastolic parameters are  indeterminate.   2. Right ventricular systolic function is moderately reduced. The right  ventricular size is normal.   3. Left atrial size was severely dilated.   4. Right atrial size was severely dilated.   5. The mitral valve is normal in structure. Moderate to severe mitral  valve regurgitation.   6. The aortic valve was not well visualized. Aortic valve regurgitation  is mild.   Patient Profile     70 y.o. female with history of HTN and tobacco use who has not seen a physician as an outpatient in several years admitted with sudden onset of dyspnea and hypertensive emergency who we are seeing for acute HFrEF, moderate to severe mitral regurgitation, and LBBB with worsening renal function noted with IV diuresis.   Assessment & Plan    Acute on  chronic diastolic and systolic CHF   On admission, received IV Lasix x4 CT contrast, metoprolol succinate 50x2, BiDil  Subsequent hypotension , likely contributing to ATN  -Renal function improved  Underwent cardiac catheterization with severe single-vessel disease Fluctuating renal function since that time,  Todays regimen includes Entresto 49/51 mg twice daily, metoprolol succinate -Would monitor blood pressures after morning doses, consider adding low-dose hydralazine/isosorbide dinitrate (may be cheaper as separate pills) As outpatient consider adding Farxiga/Jardiance -She is indicated she would like to go home today, that she walked around the nursing unit yesterday without significant symptoms  Cardiomyopathy   Severe single-vessel disease, medical management at this time Cardiomyopathy out of proportion to underlying coronary disease Unable to exclude  hypertensive cardiomyopathy severely depressed ejection fraction EF estimated 20%  -Aggressive medical management at this time    Moderate to severe MR   Secondary to LV dilation,   Follow-up as outpatient, consider repeat echocardiogram in several months time once on optimal medical management  Long discussion with her concerning her medications, debility, supports at home, need to stay on her medications, need to follow-up with office visits   Total encounter time more than 35 minutes  Greater than 50% was spent in counseling and coordination of care with the patient   For questions or updates, please contact [facility] Please consult [email or web address] for contact info under        Signed, Julien Nordmann, MD  12/08/2020, 8:38 AM

## 2020-12-08 NOTE — Progress Notes (Signed)
Mobility Specialist - Progress Note   12/08/20 1400  Mobility  Activity Ambulated in hall;Ambulated to bathroom  Level of Assistance Modified independent, requires aide device or extra time  Assistive Device Front wheel walker;None  Distance Ambulated (ft) 200 ft  Mobility Ambulated independently in hallway  Mobility Response Tolerated well  Mobility performed by Mobility specialist  $Mobility charge 1 Mobility    Pre-mobility: 77 HR, 96% SpO2 Post-mobility: 82 HR, 99% SpO2   Pt ambulated in hallway with RW, no LOB. Denied SOB on RA. Denied weakness/fatigue. Upon return to room, pt voiced need to have BM with successful attempt. Pt ambulated to bathroom without AD, modI. Still no LOB. Independent in peri-care.    Filiberto Pinks Mobility Specialist 12/08/20, 2:35 PM

## 2020-12-08 NOTE — Progress Notes (Signed)
Progress Note    Evelyn Dunn  ALP:[id number] DOB: [birth year]  DOA: 11/25/2020 PCP: Pcp, No    Brief Narrative:     Medical records reviewed and are as summarized below:  Evelyn Dunn is an 70 y.o. female  with HTN, tobacco use, who comes to the hospital with shortness of breath.  This is been progressively getting worse over the last 4 days, worse with exertion and unable to lay flat.  Impression in the ED was acute on chronic diastolic CHF, she was placed on diuretics and was admitted to the hospital.  She was also significantly hypertensive.  Cardiology consulted as she was found to have a left bundle branch block and new onset systolic CHF.  Heart cath done 6/20.  Plan per cardiology.   6/23 breathing better. No cp  Assessment/Plan:   Principal Problem:   Acute CHF (congestive heart failure) (HCC) Active Problems:   Hypertensive urgency   Nicotine dependence   Elevated troponin   LBBB (left bundle branch block)   Acute HFrEF (heart failure with reduced ejection fraction) (HCC)   Primary hypertension   Hypertensive emergency   Coronary artery disease involving native coronary artery of native heart without angina pectoris   Acute systolic CHF--new Severe cardiomyopathy EF of 20 to 25% -patient was admitted to the hospital with fluid overload in the setting of acute systolic CHF. EF 20-25% with severely decreased LV function.  RV function was moderately reduced also.   --cards:  s/p cardiac cath  Conclusions: Moderate to severe single-vessel coronary artery disease with 20-30% proximal and tandem 50-70% mid LAD stenoses.  There is mild disease involving the LCx and RCA.  Severity of cardiomyopathy is out of proportion to degree of coronary artery disease suggesting primarily non-ischemic etiology. Moderate to severely elevated left heart filling pressure. Severely elevated right heart and pulmonary artery pressures. Moderately reduced Fick cardiac output/index.    Recommendations: Restart diuresis.  If creatinine worsens in the setting of diuresis, short-term inotropic support may be needed. Optimize goal-directed medical therapy.  Defer restarting ACEI/ARB until tomorrow in order to monitor renal function. Medical therapy and risk factor modification to prevent progression of coronary artery disease.  Once the patient has been optimized from a heart failure standpoint, myocardial perfusion stress test should be considered to assess hemodynamic significance of LAD disease. Obtain renal artery Doppler to exclude renal artery stenosis in the setting of worsening renal function with addition of ACEI/ARB.   6/23 tolerated Entresto.  We will start her on low-dose BiDil with parameters.  Monitor blood pressure closely  Will need outpt addition of Farxiga/jardiance   Hypertensive emergency-stable to low to normal Continue Toprol and Entresto Adding BiDil for above monitor closely  Moderate to severe MR TEE as outpatient if EF stays low despite adequate GDMT     Tobacco use-was counseled about smoking cessation during this hospitalization   Acute kidney injury on chronic kidney disease stage IIIa Renal function 1.44 today.  Mildly elevated from baseline. Continue to monitor closely Renal ultrasound diuretic duplex of renal arteries demonstrated no evidence of high-grade stenosis  Elevated TSH Free T4 normal Will need to follow-up with PCP as outpatient monitoring  Elevated d dimer -V/Q scan unclear for PE (x ray with b/l opacities l>r) -duplex LE negative -doubt PE- recent CTA negative for PE   obesity Body mass index is 36.57 kg/m.      DVT prophylaxis: Lovenox  Code Status: Full Code.  Disposition Plan: Status  is: Inpatient  Remains inpatient appropriate because:Inpatient level of care appropriate due to severity of illness  Dispo: The patient is from: Home              Anticipated d/c is to: Home              Patient currently is  not medically stable to d/c.-                 Placement not difficult  Anticpiated dc : am        Medical Consultants:   cards    Subjective:   Not dizziness  Objective:    Vitals:   12/08/20 0357 12/08/20 0555 12/08/20 0732 12/08/20 1117  BP:  129/82 (!) 142/87 123/71  Pulse:  82 77 73  Resp:  18 18 18   Temp:  98.1 F (36.7 C) (!) 97.3 F (36.3 C) 97.8 F (36.6 C)  TempSrc:  Oral Oral   SpO2:  96% 94% 98%  Weight: 90.7 kg     Height:        Intake/Output Summary (Last 24 hours) at 12/08/2020 1555 Last data filed at 12/07/2020 1813 Gross per 24 hour  Intake 240 ml  Output 800 ml  Net -560 ml   Filed Weights   12/06/20 0427 12/07/20 0645 12/08/20 0357  Weight: 98.9 kg 88.5 kg 90.7 kg    Exam: Nad, calm CTA, no wheeze rales rhonchi's Regular S1-S2 no gallops Soft benign positive bowel sounds No edema Mood and affect appropriate in current setting      Data Reviewed:   I have personally reviewed following labs and imaging studies:  Labs: Labs show the following:   Basic Metabolic Panel: Recent Labs  Lab 12/04/20 0619 12/05/20 0507 12/06/20 0514 12/07/20 0506 12/08/20 0516  NA 139 138 138 136 136  K 3.9 3.8 3.7 3.7 3.8  CL 105 105 103 100 102  CO2 25 24 27 27 26   GLUCOSE 106* 109* 119* 132* 136*  BUN 30* 26* 23 29* 28*  CREATININE 1.69* 1.28* 1.27* 1.44* 1.32*  CALCIUM 8.7* 8.7* 8.7* 8.7* 8.9   GFR Estimated Creatinine Clearance: 42.1 mL/min (A) (by C-G formula based on SCr of 1.32 mg/dL (H)). Liver Function Tests: No results for input(s): AST, ALT, ALKPHOS, BILITOT, PROT, ALBUMIN in the last 168 hours. No results for input(s): LIPASE, AMYLASE in the last 168 hours. No results for input(s): AMMONIA in the last 168 hours. Coagulation profile No results for input(s): INR, PROTIME in the last 168 hours.  CBC: Recent Labs  Lab 12/04/20 0619 12/07/20 0506  WBC 6.9 6.7  HGB 14.1 14.7  HCT 42.3 44.2  MCV 95.7 93.6  PLT 193 246    Cardiac Enzymes: No results for input(s): CKTOTAL, CKMB, CKMBINDEX, TROPONINI in the last 168 hours. BNP (last 3 results) No results for input(s): PROBNP in the last 8760 hours. CBG: No results for input(s): GLUCAP in the last 168 hours. D-Dimer: No results for input(s): DDIMER in the last 72 hours.  Hgb A1c: No results for input(s): HGBA1C in the last 72 hours. Lipid Profile: No results for input(s): CHOL, HDL, LDLCALC, TRIG, CHOLHDL, LDLDIRECT in the last 72 hours. Thyroid function studies: Recent Labs    12/06/20 0514  TSH 6.831*   Anemia work up: Recent Labs    12/06/20 0514  FERRITIN 71   Sepsis Labs: Recent Labs  Lab 12/04/20 0619 12/07/20 0506  WBC 6.9 6.7    Microbiology No results found  for this or any previous visit (from the past 240 hour(s)).   Procedures and diagnostic studies:  No results found.  Medications:    aspirin  81 mg Oral Daily   atorvastatin  40 mg Oral Daily   diclofenac Sodium  2 g Topical TID AC & HS   enoxaparin (LOVENOX) injection  0.5 mg/kg Subcutaneous Q24H   feeding supplement  237 mL Oral BID BM   melatonin  5 mg Oral QHS   metoprolol succinate  25 mg Oral Daily   nystatin cream   Topical BID   sacubitril-valsartan  1 tablet Oral BID   senna-docusate  2 tablet Oral QHS   sodium chloride flush  3 mL Intravenous Q12H   sodium chloride flush  3 mL Intravenous Q12H   sodium chloride flush  3 mL Intravenous Q12H   Continuous Infusions:  sodium chloride     sodium chloride       LOS: 13 days   Ferrin Liebig  [facility]  Time spent more than 35 minutes with more than 50% on COC   12/08/2020, 3:55 PM

## 2020-12-09 ENCOUNTER — Telehealth: Payer: Self-pay

## 2020-12-09 DIAGNOSIS — I5041 Acute combined systolic (congestive) and diastolic (congestive) heart failure: Secondary | ICD-10-CM

## 2020-12-09 MED ORDER — ASPIRIN 81 MG PO CHEW: 81.0000 mg | CHEWABLE_TABLET | Freq: Every day | ORAL | 0 refills | Status: AC

## 2020-12-09 MED ORDER — SACUBITRIL-VALSARTAN 49-51 MG PO TABS
1.0000 | ORAL_TABLET | Freq: Two times a day (BID) | ORAL | 0 refills | Status: DC
Start: 2020-12-09 — End: 2020-12-15

## 2020-12-09 MED ORDER — NYSTATIN 100000 UNIT/GM EX CREA: TOPICAL_CREAM | Freq: Two times a day (BID) | CUTANEOUS | 0 refills | Status: AC

## 2020-12-09 MED ORDER — ISOSORB DINITRATE-HYDRALAZINE 20-37.5 MG PO TABS: 1.0000 | ORAL_TABLET | Freq: Three times a day (TID) | ORAL | 0 refills | Status: AC

## 2020-12-09 MED ORDER — ATORVASTATIN CALCIUM 20 MG PO TABS
40.0000 mg | ORAL_TABLET | Freq: Every day | ORAL | 0 refills | Status: DC
Start: 2020-12-09 — End: 2021-03-16

## 2020-12-09 MED ORDER — METOPROLOL SUCCINATE ER 25 MG PO TB24: 25.0000 mg | ORAL_TABLET | Freq: Every day | ORAL | 0 refills | Status: DC

## 2020-12-09 NOTE — Discharge Summary (Signed)
Evelyn Dunn WLN:[id number] DOB: 08-06-1950 DOA: 11/25/2020  PCP: Pcp, No  Admit date: 11/25/2020 Discharge date: 12/11/2020  Admitted From: home Disposition:  home  Recommendations for Outpatient Follow-up:  Follow up with PCP in 1 week Please obtain BMP/CBC in one week Please follow up on the following cardiiology  Home Health:yes    Discharge Condition:Stable CODE STATUS:full  Diet recommendation: Heart Healthy  Brief/Interim Summary: Per HPI: Evelyn Dunn is a 70 y.o. female with medical history significant for nicotine dependence and hypertension who was brought into the ER by EMS after they were called out for evaluation of what was initially thought to be an anxiety episode.  Patient was having a disagreement with her brother when all of a sudden she became very short of breath and nauseous.+orthopenia.She initially presented to the ER as a code STEMI because her twelve-lead EKG showed a left bundle branch block and there was no prior ones to compare with.  She was seen in consultation by cardiology and code STEMI was discontinued. She received a dose of IV Lasix in the ER and will be admitted to the hospital for further evaluation.    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 EF 20-25% with severely decreased LV function.  RV function was moderately reduced also.   --cards:  s/p cardiac cath             Conclusions: Moderate to severe single-vessel coronary artery disease with 20-30% proximal and tandem 50-70% mid LAD stenoses.  There is mild disease involving the LCx and RCA.  Severity of cardiomyopathy is out of proportion to degree of coronary artery disease suggesting primarily non-ischemic etiology. Moderate to severely elevated left heart filling pressure. Severely elevated right heart and pulmonary artery pressures. Moderately reduced Fick  cardiac output/index.   Cardiology recommended HF regimen. Was started on beta blk, Entresto which she tolerated.  Bidil was started. Cardiology >>>>Will need outpt addition of Farxiga/jardiance CHF clinic in one week -patient was discharged by nursing prior to cardiology seeing this am, although cardiology was ok yesterday for pt to be discharged today. Pt was contacted about initiation of lasix by CHF RN.   Hypertensive emergency- Improved.  Continue above meds   Moderate to severe MR TEE as outpatient if EF stays low despite adequate GDMT  Possibly due to LV dilation.   Severe single-vessel disease medical management at this time Cardiomyopathy out of proportion to underlying coronary disease Unable to exclude hypertensive cardiomyopathy   Tobacco use-was counseled about smoking cessation during this hospitalization   Acute kidney injury on chronic kidney disease stage IIIa Renal ultrasound diuretic duplex of renal arteries demonstrated no evidence of high-grade stenosis Monitor as outpatient by pcp /cardiology   Elevated TSH Free T4 normal Will need to follow-up with PCP as outpatient monitoring   Elevated d dimer -V/Q scan unclear for PE (x ray with b/l opacities l>r) -duplex LE negative -doubt PE- recent CTA negative for PE   obesity Body mass index is 36.57 kg/m.   8 mm hypervascular lesion in the medial segment left liver found on CT incidentally is associated with a low-density lesion in the posterior aspect of the lateral segment left liver. Follow-up non emergent outpatient MRI without and with contrast after the patient recovers from this acute episode is recommended. Patient was instructed to f/u with pcp for further evaluation.  Discharge Diagnoses:  Principal Problem:   Acute CHF (congestive heart failure) (HCC) Active Problems:   Hypertensive urgency   Nicotine dependence   Elevated troponin   LBBB (left bundle branch block)   Acute HFrEF  (heart failure with reduced ejection fraction) (HCC)   Primary hypertension   Hypertensive emergency   Coronary artery disease involving native coronary artery of native heart without angina pectoris    Discharge Instructions  Discharge Instructions     AMB referral to CHF clinic   Complete by: As directed    Diet - low sodium heart healthy   Complete by: As directed    Discharge instructions   Complete by: As directed    Follow up with cardiology next week F/u with pcp in one week, need blood work   Increase activity slowly   Complete by: As directed       Allergies as of 12/09/2020   No Known Allergies      Medication List     TAKE these medications    aspirin 81 MG chewable tablet Chew 1 tablet (81 mg total) by mouth daily.   atorvastatin 20 MG tablet Commonly known as: LIPITOR Take 2 tablets (40 mg total) by mouth daily.   diphenhydrAMINE 25 MG tablet Commonly known as: BENADRYL Take 25 mg by mouth every 6 (six) hours as needed.   isosorbide-hydrALAZINE 20-37.5 MG tablet Commonly known as: BIDIL Take 1 tablet by mouth 3 (three) times daily.   lansoprazole 15 MG capsule Commonly known as: PREVACID Take 15 mg by mouth daily at 12 noon.   metoprolol succinate 25 MG 24 hr tablet Commonly known as: TOPROL-XL Take 1 tablet (25 mg total) by mouth daily.   nystatin cream Commonly known as: MYCOSTATIN Apply topically 2 (two) times daily.   sacubitril-valsartan 49-51 MG Commonly known as: ENTRESTO Take 1 tablet by mouth 2 (two) times daily.        Follow-up Information     End, Cristal Deer, MD Follow up on 01/18/2021.   Specialty: Cardiology Why: @ 9am Patient will be added to the waitlist if anything becomes available before appointment. Contact information: [address] [place] [address] [phone]                No Known Allergies  Consultations: cardiology   Procedures/Studies: DG Chest 2 View  Result  Date: 12/03/2020 CLINICAL DATA:  Shortness of breath EXAM: CHEST - 2 VIEW COMPARISON:  11/25/2020 FINDINGS: Cardiac shadow remains enlarged. Patient rotation to the right is noted further accentuating the mediastinal markings. Bibasilar airspace opacity is noted left slightly greater than right new from the prior exam. No bony abnormality is noted. IMPRESSION: Increasing bibasilar airspace opacities left greater than right. Electronically Signed   By: Alcide Clever M.D.   On: 12/03/2020 15:29   CT Angio Chest PE W and/or Wo Contrast  Result Date: 11/25/2020 CLINICAL DATA:  Shortness of breath.  PE suspected. EXAM: CT ANGIOGRAPHY CHEST WITH CONTRAST TECHNIQUE: Multidetector CT imaging of the chest was performed using the standard protocol during bolus administration of intravenous contrast. Multiplanar CT image reconstructions and MIPs were obtained to evaluate the vascular anatomy. CONTRAST:  75mL OMNIPAQUE IOHEXOL 350 MG/ML SOLN COMPARISON:  None. FINDINGS: Cardiovascular: Heart is enlarged. No pericardial effusion. Coronary artery calcification is evident. Atherosclerotic calcification is noted in the wall of the thoracic aorta. Enlargement of the pulmonary outflow tract and main pulmonary arteries suggests pulmonary arterial hypertension. There is no large central pulmonary  embolus in the main pulmonary outflow tract or either main pulmonary artery. No evidence for lobar pulmonary embolic disease. Segmental and subsegmental branches to both lung show no definite pulmonary embolus although assessment is markedly degraded by breathing motion and may be unreliable. Mediastinum/Nodes: Scattered small mediastinal lymph nodes are evident. 14 mm short axis subcarinal lymph node is mildly enlarged. 11 mm short axis lymph node identified in the right hilum. The esophagus has normal imaging features. There is no axillary lymphadenopathy. Lungs/Pleura: Fine parenchymal detail the lungs is obscured by breathing motion.  Patchy ground-glass attenuation in the dependent lower lungs bilaterally is probably atelectasis although infectious/inflammatory airspace disease cannot be excluded. Small bilateral pleural effusions noted, right greater than left. Upper Abdomen: Reflux of injected contrast into the IVC and hepatic veins suggests right heart dysfunction. 8 mm hypervascular lesion in the medial segment left liver on 75/4 cannot be further characterized. 1.7 cm low-density lesion posteriorly in the lateral segment left liver cannot be definitively characterized but is likely benign. Layering tiny gallstones evident. Musculoskeletal: No worrisome lytic or sclerotic osseous abnormality. Review of the MIP images confirms the above findings. IMPRESSION: 1. Limited study due to breathing motion artifact. No large central pulmonary embolus evident. Segmental and subsegmental branches to both lung show no definite pulmonary embolus although assessment is markedly degraded by breathing motion and may be unreliable. 2. Enlargement of the pulmonary outflow tract and main pulmonary arteries suggests pulmonary arterial hypertension. 3. Reflux of intravenous contrast into the IVC and hepatic veins suggests right heart dysfunction. 4. Small bilateral pleural effusions, right greater than left. 5. Patchy ground-glass attenuation in the dependent lower lungs bilaterally is probably atelectasis although infectious/inflammatory airspace disease cannot be excluded. 6. Mild mediastinal and right hilar lymphadenopathy, potentially reactive. 7. 8 mm hypervascular lesion in the medial segment left liver is associated with a low-density lesion in the posterior aspect of the lateral segment left liver. Follow-up non emergent outpatient MRI without and with contrast after the patient recovers from this acute episode is recommended. 8. Cholelithiasis. 9. Aortic Atherosclerosis (ICD10-I70.0). Electronically Signed   By: Kennith Center M.D.   On: 11/25/2020 13:01    NM Pulmonary Perfusion  Addendum Date: 12/03/2020   ADDENDUM REPORT: 12/03/2020 15:47 ADDENDUM: This addendum was made after review of a chest radiograph and lower extremity venous duplex on 12/03/2020. There is volume loss in the right lung and there are patchy densities at the left lung base that could be contributing to the perfusion abnormalities. Based on these imaging findings, pulmonary embolism cannot be excluded but the study is equivocal. Findings discussed with Dr. Benjamine Mola on 12/03/2020 at 1540 hours. Electronically Signed   By: Richarda Overlie M.D.   On: 12/03/2020 15:47   Addendum Date: 12/03/2020   ADDENDUM REPORT: 12/03/2020 13:23 ADDENDUM: These results were called by telephone at the time of interpretation on 12/03/2020 at 1:22 pm to provider Dr. Benjamine Mola, who verbally acknowledged these results. Electronically Signed   By: Richarda Overlie M.D.   On: 12/03/2020 13:23   Result Date: 12/03/2020 CLINICAL DATA:  70 year old with suspected pulmonary embolism. Shortness of breath for 5 days. Chest radiograph and chest CT on 11/25/2020. EXAM: NUCLEAR MEDICINE PERFUSION LUNG SCAN TECHNIQUE: Perfusion images were obtained in multiple projections after intravenous injection of radiopharmaceutical. Ventilation scans intentionally deferred if perfusion scan and chest x-ray adequate for interpretation during COVID 19 epidemic. RADIOPHARMACEUTICALS:  3.97 mCi Tc-74m MAA IV COMPARISON:  Chest CT and chest radiograph 11/25/2020 FINDINGS: Abnormal perfusion in the  right lung particularly along the anterior aspect of the mid and lower right lung. Evidence for a large wedge-shaped defect involving the anterior left lower lung. Most recent chest radiograph was on 11/25/2020. IMPRESSION: Abnormal perfusion in both lungs. Findings are suggestive for pulmonary embolism. Recommend updated chest radiograph to exclude significant lung abnormalities. Electronically Signed: By: Richarda Overlie M.D. On: 12/03/2020 13:18   CARDIAC  CATHETERIZATION  Result Date: 12/05/2020 Conclusions: 1. Moderate to severe single-vessel coronary artery disease with 20-30% proximal and tandem 50-70% mid LAD stenoses.  There is mild disease involving the LCx and RCA.  Severity of cardiomyopathy is out of proportion to degree of coronary artery disease suggesting primarily non-ischemic etiology. 2. Moderate to severely elevated left heart filling pressure. 3. Severely elevated right heart and pulmonary artery pressures. 4. Moderately reduced Fick cardiac output/index. Recommendations: 1. Restart diuresis.  If creatinine worsens in the setting of diuresis, short-term inotropic support may be needed. 2. Optimize goal-directed medical therapy.  Defer restarting ACEI/ARB until tomorrow in order to monitor renal function. 3. Medical therapy and risk factor modification to prevent progression of coronary artery disease.  Once the patient has been optimized from a heart failure standpoint, myocardial perfusion stress test should be considered to assess hemodynamic significance of LAD disease. 4. Obtain renal artery Doppler to exclude renal artery stenosis in the setting of worsening renal function with addition of ACEI/ARB. Yvonne Kendall, MD [facility]   US Venous Img Lower Bilateral (DVT)  Result Date: 12/03/2020 CLINICAL DATA:  70 year old with abnormal perfusion on nuclear medicine examination. Findings raise concern for pulmonary embolism. Evaluate for lower extremity DVT. EXAM: BILATERAL LOWER EXTREMITY VENOUS DOPPLER ULTRASOUND TECHNIQUE: Gray-scale sonography with graded compression, as well as color Doppler and duplex ultrasound were performed to evaluate the lower extremity deep venous systems from the level of the common femoral vein and including the common femoral, femoral, profunda femoral, popliteal and calf veins including the posterior tibial, peroneal and gastrocnemius veins when visible. The superficial great saphenous vein was also  interrogated. Spectral Doppler was utilized to evaluate flow at rest and with distal augmentation maneuvers in the common femoral, femoral and popliteal veins. COMPARISON:  None. FINDINGS: RIGHT LOWER EXTREMITY Common Femoral Vein: No evidence of thrombus. Normal compressibility, respiratory phasicity and response to augmentation. Saphenofemoral Junction: No evidence of thrombus. Normal compressibility and flow on color Doppler imaging. Profunda Femoral Vein: No evidence of thrombus. Normal compressibility and flow on color Doppler imaging. Femoral Vein: No evidence of thrombus. Normal compressibility, respiratory phasicity and response to augmentation. Popliteal Vein: No evidence of thrombus. Normal compressibility, respiratory phasicity and response to augmentation. Calf Veins: No evidence of thrombus. Normal compressibility and flow on color Doppler imaging. Other Findings:  None. LEFT LOWER EXTREMITY Common Femoral Vein: No evidence of thrombus. Normal compressibility, respiratory phasicity and response to augmentation. Saphenofemoral Junction: No evidence of thrombus. Normal compressibility and flow on color Doppler imaging. Profunda Femoral Vein: No evidence of thrombus. Normal compressibility and flow on color Doppler imaging. Femoral Vein: No evidence of thrombus. Normal compressibility, respiratory phasicity and response to augmentation. Popliteal Vein: No evidence of thrombus. Normal compressibility, respiratory phasicity and response to augmentation. Calf Veins: No evidence of thrombus. Normal compressibility and flow on color Doppler imaging. Other Findings:  None. IMPRESSION: No evidence of deep venous thrombosis in either lower extremity. Electronically Signed   By: Richarda Overlie M.D.   On: 12/03/2020 15:33   DG Chest Portable 1 View  Result Date: 11/25/2020 CLINICAL DATA:  Shortness of breath. EXAM: PORTABLE CHEST 1 VIEW COMPARISON:  None FINDINGS: Image rotated to the RIGHT. Cardiomediastinal  contours accounting for this may be normal though there is fullness of the RIGHT hilum which is asymmetric. Added density projecting over the RIGHT mainstem bronchus. EKG leads project over the chest. No lobar consolidation. No sign of pleural effusion on frontal radiograph. No acute skeletal process on limited assessment. IMPRESSION: 1. Asymmetric fullness of the RIGHT hilum and density projecting over the RIGHT mainstem bronchus. Fullness of hilum could be due to rotation or hilar adenopathy. 2. Question of material in the RIGHT mainstem bronchus. While some of this could be due to artifact due to rotation would suggest consideration for PA and lateral chest when the patient is able or CT as warranted. Electronically Signed   By: Donzetta Kohut M.D.   On: 11/25/2020 11:34   DG Abd Portable 1V  Result Date: 12/02/2020 CLINICAL DATA:  Abdominal pain and distension EXAM: PORTABLE ABDOMEN - 1 VIEW COMPARISON:  None. FINDINGS: Scattered large and small bowel gas is noted. No obstructive changes are seen. No free air is noted. Degenerative changes of lumbar spine are seen. IMPRESSION: No acute abnormality to correspond with the given clinical history. Electronically Signed   By: Alcide Clever M.D.   On: 12/02/2020 18:32   US RENAL ARTERY DUPLEX COMPLETE  Result Date: 12/06/2020 CLINICAL DATA:  70 year old female with a history of hypertension EXAM: RENAL/URINARY TRACT ULTRASOUND RENAL DUPLEX DOPPLER ULTRASOUND COMPARISON:  None. FINDINGS: Right Kidney: Length: 10.9 cm. Echogenicity within normal limits. No mass or hydronephrosis visualized. Left Kidney: Length: 9.7 cm. No hydronephrosis. Echogenicity symmetric to the right. 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 Bladder:  Unremarkable RENAL DUPLEX ULTRASOUND Right Renal Artery Velocities: Origin:  90 cm/sec Mid:  59 cm/sec Hilum:  30 cm/sec Interlobar:  19 cm/sec Arcuate:  17 cm/sec Left  Renal Artery Velocities: Origin:  117 cm/sec Mid:  75 cm/sec Hilum:  69 cm/sec Interlobar:  22 cm/sec Arcuate:  15 cm/sec Aortic Velocity:  182 cm/sec Right Renal-Aortic Ratios: Origin: 0.5 Mid:  0.3 Hilum: 0.2 Interlobar: 0.1 Arcuate: 0.1 Left Renal-Aortic Ratios: Origin: 0.6 Mid: 0.4 Hilum: 0.4 Interlobar: 0.1 Arcuate: 0.1 IMPRESSION: Directed duplex of the renal arteries demonstrates no evidence of high-grade stenosis Signed, Yvone Neu. Reyne Dumas, RPVI [facility] Electronically Signed   By: Gilmer Mor D.O.   On: 12/06/2020 11:52   ECHOCARDIOGRAM COMPLETE  Result Date: 11/26/2020    ECHOCARDIOGRAM REPORT   Patient Name:   PEGGI CHIRILLO Culmer Date of Exam: 11/26/2020 Medical Rec #:  [id number]      Height: Accession #:    [id number]     Weight:       215.4 lb Date of Birth:  06-29-1950      BSA:          2.122 m Patient Age:    69 years       BP:           110/83 mmHg Patient Gender: F              HR:           93 bpm. Exam Location:  [facility] Procedure: 2D Echo Indications:     CHF I50.31  History:         Patient has no prior history of Echocardiogram examinations.  Sonographer:  Overton Mam Referring Phys:  [id number] WYOVZCHY AGBATA Diagnosing Phys: Debbe Odea MD  Sonographer Comments: Technically difficult study due to poor echo windows. Image acquisition challenging due to respiratory motion. IMPRESSIONS  1. Left ventricular ejection fraction, by estimation, is 20 to 25%. The left ventricle has severely decreased function. The left ventricle demonstrates global hypokinesis. The left ventricular internal cavity size was moderately dilated. There is mild left ventricular hypertrophy. Left ventricular diastolic parameters are indeterminate.  2. Right ventricular systolic function is moderately reduced. The right ventricular size is normal.  3. Left atrial size was severely dilated.  4. Right atrial size was severely dilated.  5. The mitral valve  is normal in structure. Moderate to severe mitral valve regurgitation.  6. The aortic valve was not well visualized. Aortic valve regurgitation is mild. FINDINGS  Left Ventricle: Left ventricular ejection fraction, by estimation, is 20 to 25%. The left ventricle has severely decreased function. The left ventricle demonstrates global hypokinesis. The left ventricular internal cavity size was moderately dilated. There is mild left ventricular hypertrophy. Left ventricular diastolic parameters are indeterminate. Right Ventricle: The right ventricular size is normal. No increase in right ventricular wall thickness. Right ventricular systolic function is moderately reduced. Left Atrium: Left atrial size was severely dilated. Right Atrium: Right atrial size was severely dilated. Pericardium: There is no evidence of pericardial effusion. Mitral Valve: The mitral valve is normal in structure. Moderate to severe mitral valve regurgitation. Tricuspid Valve: The tricuspid valve is normal in structure. Tricuspid valve regurgitation is mild. Aortic Valve: The aortic valve was not well visualized. Aortic valve regurgitation is mild. Aortic regurgitation PHT measures 400 msec. Aortic valve peak gradient measures 4.5 mmHg. Pulmonic Valve: The pulmonic valve was not well visualized. Pulmonic valve regurgitation is not visualized. Aorta: The aortic root is normal in size and structure. Venous: The inferior vena cava was not well visualized. IAS/Shunts: No atrial level shunt detected by color flow Doppler.  LEFT VENTRICLE PLAX 2D LVIDd:         5.86 cm      Diastology LVIDs:         5.18 cm      LV e' medial:    6.96 cm/s LV PW:         1.14 cm      LV E/e' medial:  12.0 LV IVS:        1.25 cm      LV e' lateral:   8.16 cm/s LVOT diam:     2.30 cm      LV E/e' lateral: 10.2 LV SV:         38 LV SV Index:   18 LVOT Area:     4.15 cm  LV Volumes (MOD) LV vol d, MOD A4C: 158.0 ml LV vol s, MOD A4C: 115.0 ml LV SV MOD A4C:     158.0 ml  RIGHT VENTRICLE RV Basal diam:  3.97 cm TAPSE (M-mode): 1.3 cm LEFT ATRIUM             Index       RIGHT ATRIUM           Index LA diam:        4.20 cm 1.98 cm/m  RA Area:     19.30 cm LA Vol (A2C):   71.4 ml 33.65 ml/m RA Volume:   65.90 ml  31.06 ml/m LA Vol (A4C):   75.5 ml 35.59 ml/m LA Biplane Vol: 77.4 ml 36.48 ml/m  AORTIC VALVE  AV Area (Vmax): 2.38 cm AV Vmax:        106.00 cm/s AV Peak Grad:   4.5 mmHg LVOT Vmax:      60.70 cm/s LVOT Vmean:     36.200 cm/s LVOT VTI:       0.092 m AI PHT:         400 msec  AORTA Ao Root diam: 3.50 cm MITRAL VALVE MV Area (PHT): 3.26 cm      SHUNTS MV Decel Time: 233 msec      Systemic VTI:  0.09 m MR Peak grad:    99.2 mmHg   Systemic Diam: 2.30 cm MR Mean grad:    57.0 mmHg MR Vmax:         498.00 cm/s MR Vmean:        356.0 cm/s MR PISA:         6.28 cm MR PISA Eff ROA: 40 mm MR PISA Radius:  1.00 cm MV E velocity: 83.60 cm/s MV A velocity: 23.10 cm/s MV E/A ratio:  3.62 Debbe Odea MD Electronically signed by Debbe Odea MD Signature Date/Time: 11/26/2020/11:18:15 AM    Final       Subjective: Has no sob, cp. No overnight issues or complaints this am. She reports wanting to go home  Discharge Exam: Vitals:   12/09/20 0818 12/09/20 1152  BP: 116/73 95/76  Pulse: 81 86  Resp: 18 18  Temp: 98.6 F (37 C) 97.8 F (36.6 C)  SpO2: 97% 94%   Vitals:   12/08/20 1917 12/09/20 0437 12/09/20 0818 12/09/20 1152  BP: 112/72 127/73 116/73 95/76  Pulse: (!) 104 88 81 86  Resp: 20 20 18 18   Temp: 97.7 F (36.5 C) 98.5 F (36.9 C) 98.6 F (37 C) 97.8 F (36.6 C)  TempSrc: Oral Oral Oral Oral  SpO2: 95% 98% 97% 94%  Weight:  91.1 kg    Height:        General: Pt is alert, awake, not in acute distress Cardiovascular: RRR, S1/S2 +, no rubs, no gallops Respiratory: CTA bilaterally, no wheezing, no rhonchi Abdominal: Soft, NT, ND, bowel sounds + Extremities: no edema    The results of significant diagnostics from this  hospitalization (including imaging, microbiology, ancillary and laboratory) are listed below for reference.     Microbiology: No results found for this or any previous visit (from the past 240 hour(s)).   Labs: BNP (last 3 results) Recent Labs    11/25/20 1053 12/07/20 0506  BNP 1,370.8* 251.0*   Basic Metabolic Panel: Recent Labs  Lab 12/05/20 0507 12/06/20 0514 12/07/20 0506 12/08/20 0516  NA 138 138 136 136  K 3.8 3.7 3.7 3.8  CL 105 103 100 102  CO2 24 27 27 26   GLUCOSE 109* 119* 132* 136*  BUN 26* 23 29* 28*  CREATININE 1.28* 1.27* 1.44* 1.32*  CALCIUM 8.7* 8.7* 8.7* 8.9   Liver Function Tests: No results for input(s): AST, ALT, ALKPHOS, BILITOT, PROT, ALBUMIN in the last 168 hours. No results for input(s): LIPASE, AMYLASE in the last 168 hours. No results for input(s): AMMONIA in the last 168 hours. CBC: Recent Labs  Lab 12/07/20 0506  WBC 6.7  HGB 14.7  HCT 44.2  MCV 93.6  PLT 246   Cardiac Enzymes: No results for input(s): CKTOTAL, CKMB, CKMBINDEX, TROPONINI in the last 168 hours. BNP: Invalid input(s): POCBNP CBG: No results for input(s): GLUCAP in the last 168 hours. D-Dimer No results for input(s): DDIMER in the  last 72 hours. Hgb A1c No results for input(s): HGBA1C in the last 72 hours. Lipid Profile No results for input(s): CHOL, HDL, LDLCALC, TRIG, CHOLHDL, LDLDIRECT in the last 72 hours. Thyroid function studies No results for input(s): TSH, T4TOTAL, T3FREE, THYROIDAB in the last 72 hours.  Invalid input(s): FREET3 Anemia work up No results for input(s): VITAMINB12, FOLATE, FERRITIN, TIBC, IRON, RETICCTPCT in the last 72 hours. Urinalysis No results found for: COLORURINE, APPEARANCEUR, LABSPEC, PHURINE, GLUCOSEU, HGBUR, BILIRUBINUR, KETONESUR, PROTEINUR, UROBILINOGEN, NITRITE, LEUKOCYTESUR Sepsis Labs Invalid input(s): PROCALCITONIN,  WBC,  LACTICIDVEN Microbiology No results found for this or any previous visit (from the past 240  hour(s)).   Time coordinating discharge: Over 30 minutes  SIGNED:   Lynn ItoSahar Chike Farrington, MD  [facility] 12/11/2020, 9:00 PM Pager   If 7PM-7AM, please contact night-coverage [email or web address] Password [email or web address]

## 2020-12-09 NOTE — Progress Notes (Signed)
Patient discharged to home wheeled out of unit by transport and accompanied by family with all belongings.  A+Ox4.  VSS.  No complaints of pain.  Medications and discharge instructions reviewed.  All questions answered.  PIV removed, no bleeding, intact.  Patient verbalized understanding of signs and symptoms of infection.  Patient agreed to follow up with all appointments as listed on AVS.  Patient satisfied with overall care at [facility]

## 2020-12-09 NOTE — Plan of Care (Signed)
  Problem: Education: Goal: Knowledge of General Education information will improve Description: Including pain rating scale, medication(s)/side effects and non-pharmacologic comfort measures 12/09/2020 1111 by Orvan Seen, RN Outcome: Completed/Met 12/09/2020 1111 by Orvan Seen, RN Outcome: Progressing   Problem: Health Behavior/Discharge Planning: Goal: Ability to manage health-related needs will improve 12/09/2020 1111 by Orvan Seen, RN Outcome: Completed/Met 12/09/2020 1111 by Orvan Seen, RN Outcome: Progressing   Problem: Clinical Measurements: Goal: Ability to maintain clinical measurements within normal limits will improve 12/09/2020 1111 by Orvan Seen, RN Outcome: Completed/Met 12/09/2020 1111 by Orvan Seen, RN Outcome: Progressing Goal: Will remain free from infection 12/09/2020 1111 by Orvan Seen, RN Outcome: Completed/Met 12/09/2020 1111 by Orvan Seen, RN Outcome: Progressing Goal: Diagnostic test results will improve 12/09/2020 1111 by Orvan Seen, RN Outcome: Completed/Met 12/09/2020 1111 by Orvan Seen, RN Outcome: Progressing Goal: Respiratory complications will improve 12/09/2020 1111 by Orvan Seen, RN Outcome: Completed/Met 12/09/2020 1111 by Orvan Seen, RN Outcome: Progressing Goal: Cardiovascular complication will be avoided 12/09/2020 1111 by Orvan Seen, RN Outcome: Completed/Met 12/09/2020 1111 by Orvan Seen, RN Outcome: Progressing   Problem: Activity: Goal: Risk for activity intolerance will decrease 12/09/2020 1111 by Orvan Seen, RN Outcome: Completed/Met 12/09/2020 1111 by Orvan Seen, RN Outcome: Progressing   Problem: Nutrition: Goal: Adequate nutrition will be maintained 12/09/2020 1111 by Orvan Seen, RN Outcome: Completed/Met 12/09/2020 1111 by Orvan Seen, RN Outcome: Progressing   Problem: Coping: Goal: Level of anxiety will decrease 12/09/2020  1111 by Orvan Seen, RN Outcome: Completed/Met 12/09/2020 1111 by Orvan Seen, RN Outcome: Progressing   Problem: Elimination: Goal: Will not experience complications related to bowel motility 12/09/2020 1111 by Orvan Seen, RN Outcome: Completed/Met 12/09/2020 1111 by Orvan Seen, RN Outcome: Progressing Goal: Will not experience complications related to urinary retention 12/09/2020 1111 by Orvan Seen, RN Outcome: Completed/Met 12/09/2020 1111 by Orvan Seen, RN Outcome: Progressing   Problem: Pain Managment: Goal: General experience of comfort will improve 12/09/2020 1111 by Orvan Seen, RN Outcome: Completed/Met 12/09/2020 1111 by Orvan Seen, RN Outcome: Progressing   Problem: Safety: Goal: Ability to remain free from injury will improve 12/09/2020 1111 by Orvan Seen, RN Outcome: Completed/Met 12/09/2020 1111 by Orvan Seen, RN Outcome: Progressing   Problem: Skin Integrity: Goal: Risk for impaired skin integrity will decrease 12/09/2020 1111 by Orvan Seen, RN Outcome: Completed/Met 12/09/2020 1111 by Orvan Seen, RN Outcome: Progressing

## 2020-12-09 NOTE — Progress Notes (Signed)
Heart Failure Nurse Navigator Note  Met with patient today and she is being discharged.  Wanted to review her medications with her.  Also again discussed the importance of daily weights and what to report.  She had no further questions.  Pricilla Riffle RN CHFN

## 2020-12-09 NOTE — Plan of Care (Signed)

## 2020-12-09 NOTE — TOC Transition Note (Signed)
Transition of Care [facility]) - CM/SW Discharge Note   Patient Details  Name: BREIA OCAMPO MRN: [id number] Date of Birth: 1950-09-29  [facility] CM/SW Contact:  Hetty Ely, RN Phone Number: 12/09/2020, 10:43 AM   Clinical Narrative: Patient to discharge home today, sister Corrie Dandy will transport. Patient declines Home Health OT, says she is independent with ADL's and able to cook and shop when needed. Still driving safely. Patient has Insurance will use to get medications. No barriers identified, TOC barriers resolved.      Final next level of care: Home/Self Care Barriers to Discharge: Barriers Resolved   Patient Goals and CMS Choice Patient states their goals for this hospitalization and ongoing recovery are:: To return home lives with brother. CMS Medicare.gov Compare Post Acute Care list provided to:: Patient Choice offered to / list presented to : Patient  Discharge Placement                Patient to be transferred to facility by: [facility] will transport. Name of family member notified: Mary Patient and family notified of of transfer: 12/09/20  Discharge Plan and Services                DME Arranged: N/A DME Agency: NA       HH Arranged: Refused HH HH Agency: [facility] Date HH Agency Contacted: 12/01/20 Time HH Agency Contacted: 1602 Representative spoke with at [facility] Agency: Brittney  Social Determinants of Health (SDOH) Interventions     Readmission Risk Interventions No flowsheet data found.

## 2020-12-09 NOTE — Telephone Encounter (Signed)
Post discharge phone call   Called patient and spoke directly with her.  She states that she has home lying in bed and is doing well.  Instructed that she had gotten away without the cardiologist seeing her and he had wanted to possibly start her on a water pill.  Instructed that I had moved her heart failure appointment from July to June 30, at 2 PM.  She is in agreement.  Instructed to make sure that she was very careful with doing her daily weights and to please call with any increased symptoms or weight gain.  She voices understanding.  Tresa Endo RN CHFN

## 2020-12-14 NOTE — Progress Notes (Signed)
Patient ID: Evelyn Dunn, female    DOB: 08-25-1950, 70 y.o.   MRN: [id number]  HPI  Evelyn Dunn is a 70 y/o female with a history of HTN,  tobacco use and chronic heart failure.   Echo report from 11/26/20 reviewed and showed an EF of 20-25% along with moderate/ severe MR.   LHC/RHC done 12/15/20 showed: Moderate to severe single-vessel coronary artery disease with 20-30% proximal and tandem 50-70% mid LAD stenoses.  There is mild disease involving the LCx and RCA.  Severity of cardiomyopathy is out of proportion to degree of coronary artery disease suggesting primarily non-ischemic etiology. Moderate to severely elevated left heart filling pressure. Severely elevated right heart and pulmonary artery pressures. Moderately reduced Fick cardiac output/index.  Admitted 11/25/20 due to acute HF. Initially thought to be STEMI but this was dismissed. Cardiology consult obtained. Initially given IV lasix with transition to oral diuretics. Leg doppler negative for DVT. Discharged after 14 days.   She presents today for her initial visit with a chief complaint of minimal fatigue upon moderate exertion. She describes this as having been present for several years although she reports feeling much better since her recent admission. She has associated shoulder pain along with this. She denies any difficulty sleeping, abdominal distention, palpitations, pedal edema, chest pain, shortness of breath, cough or dizziness.   She has to have her sister look at her scales.   Has not started entresto and mentions that she doesn't have good prescription coverage. Was not able to start entresto or bidil due to cost.   Past Medical History:  Diagnosis Date   Hypertension    Past Surgical History:  Procedure Laterality Date   RIGHT/LEFT HEART CATH AND CORONARY ANGIOGRAPHY N/A 12/05/2020   Procedure: RIGHT/LEFT HEART CATH AND CORONARY ANGIOGRAPHY;  Surgeon: Yvonne Kendall, MD;  Location: [facility] INVASIVE CV LAB;   Service: Cardiovascular;  Laterality: N/A;   Family History  Problem Relation Age of Onset   Valvular heart disease Mother    Heart failure Father    Coronary artery disease Sister    Vascular Disease Brother    Social History   Tobacco Use   Smoking status: Every Day    Packs/day: 0.25    Pack years: 0.00    Types: Cigarettes   Smokeless tobacco: Never  Substance Use Topics   Alcohol use: Yes    Alcohol/week: 5.0 standard drinks    Types: 1 Cans of beer, 4 Standard drinks or equivalent per week   No Known Allergies Prior to Admission medications   Medication Sig Start Date End Date Taking? Authorizing Provider  aspirin 81 MG chewable tablet Chew 1 tablet (81 mg total) by mouth daily. 12/10/20 01/09/21 Yes Lynn Ito, MD  atorvastatin (LIPITOR) 20 MG tablet Take 2 tablets (40 mg total) by mouth daily. 12/09/20 01/08/21 Yes Lynn Ito, MD  metoprolol succinate (TOPROL-XL) 25 MG 24 hr tablet Take 1 tablet (25 mg total) by mouth daily. 12/10/20 01/09/21 Yes Amery, Freddi Che, MD  isosorbide-hydrALAZINE (BIDIL) 20-37.5 MG tablet Take 1 tablet by mouth 3 (three) times daily. Patient not taking: Reported on 12/15/2020 12/09/20 01/08/21  Lynn Ito, MD  nystatin cream (MYCOSTATIN) Apply topically 2 (two) times daily. Patient not taking: Reported on 12/15/2020 12/09/20 01/08/21  Lynn Ito, MD  sacubitril-valsartan (ENTRESTO) 49-51 MG Take 1 tablet by mouth 2 (two) times daily. Patient not taking: Reported on 12/15/2020 12/09/20 01/08/21  Lynn Ito, MD    Review of Systems  Constitutional:  Positive  for fatigue. Negative for appetite change.  HENT:  Negative for congestion, postnasal drip and sore throat.   Eyes: Negative.   Respiratory:  Negative for cough, chest tightness and shortness of breath.   Cardiovascular:  Negative for chest pain, palpitations and leg swelling.  Gastrointestinal:  Negative for abdominal distention and abdominal pain.  Endocrine: Negative.   Genitourinary:  Negative.   Musculoskeletal:  Positive for arthralgias (shoulder pain). Negative for back pain and neck pain.  Skin: Negative.   Allergic/Immunologic: Negative.   Neurological:  Negative for dizziness and light-headedness.  Hematological:  Negative for adenopathy. Does not bruise/bleed easily.  Psychiatric/Behavioral:  Negative for dysphoric mood and sleep disturbance (sleeping on 3 pillows). The patient is not nervous/anxious.    Vitals:   12/15/20 1317  BP: (!) 146/94  Pulse: 86  Resp: 18  SpO2: 99%  Weight: 196 lb 8 oz (89.1 kg)  Height: 5\' 4"  (1.626 m)   Wt Readings from Last 3 Encounters:  12/15/20 196 lb 8 oz (89.1 kg)  12/09/20 200 lb 12.8 oz (91.1 kg)   Lab Results  Component Value Date   CREATININE 1.32 (H) 12/08/2020   CREATININE 1.44 (H) 12/07/2020   CREATININE 1.27 (H) 12/06/2020    Physical Exam Vitals and nursing note reviewed.  Constitutional:      Appearance: Normal appearance.  HENT:     Head: Normocephalic and atraumatic.  Cardiovascular:     Rate and Rhythm: Normal rate and regular rhythm.  Pulmonary:     Effort: Pulmonary effort is normal. No respiratory distress.     Breath sounds: No wheezing or rales.  Abdominal:     General: There is no distension.     Palpations: Abdomen is soft.  Musculoskeletal:        General: No tenderness.     Cervical back: Normal range of motion and neck supple.     Right lower leg: No edema.     Left lower leg: No edema.  Skin:    General: Skin is warm and dry.  Neurological:     General: No focal deficit present.     Mental Status: She is alert and oriented to person, place, and time.  Psychiatric:        Mood and Affect: Mood normal.        Behavior: Behavior normal.        Thought Content: Thought content normal.    Assessment & Plan:  1: Chronic heart failure with reduced ejection fraction- - NYHA class II - euvolemic today - not weighing daily but is going to have her sister look at her scales; can  give her a set if needed; instructed to weigh daily and call for an overnight weight gain of > 2 pounds or a weekly weight gain of > 5 pounds - not adding salt to her food and is trying to closely read food labels for sodium content - has not been able to begin entresto or bidil due to cost; will begin entresto 24/26mg  BID today; 30 day voucher given and [employer] patient assistance filled out for patient - will ask [facility] do lab work at next visit - to see cardiology 12/08/2020) 01/18/21 - plan to titrate entresto & metoprolol as able; discussed adding SGLT2 or spironolactone - BNP 12/07/20 was 251.0  2: HTN- - BP mildly elevated today; adding entresto per above - planning to contact [facility] to get established with them - BMP 12/08/20 reviewed and showed sodium 136, potassium 3.8,  creatinine 1.32 and GFR 44  3: Tobacco use- - complete cessation discussed for 3 minutes   Medication bottles reviewed.   Return in 6 weeks or sooner for any questions/problems before then.

## 2020-12-15 ENCOUNTER — Ambulatory Visit: Payer: Medicare PPO | Attending: Family | Admitting: Family

## 2020-12-15 ENCOUNTER — Encounter: Payer: Self-pay | Admitting: Family

## 2020-12-15 ENCOUNTER — Other Ambulatory Visit: Payer: Self-pay

## 2020-12-15 VITALS — BP 146/94 | HR 86 | Resp 18 | Ht 64.0 in | Wt 196.5 lb

## 2020-12-15 DIAGNOSIS — I5021 Acute systolic (congestive) heart failure: Secondary | ICD-10-CM | POA: Diagnosis not present

## 2020-12-15 DIAGNOSIS — I110 Hypertensive heart disease with heart failure: Secondary | ICD-10-CM | POA: Diagnosis not present

## 2020-12-15 DIAGNOSIS — F17210 Nicotine dependence, cigarettes, uncomplicated: Secondary | ICD-10-CM | POA: Insufficient documentation

## 2020-12-15 DIAGNOSIS — Z720 Tobacco use: Secondary | ICD-10-CM

## 2020-12-15 DIAGNOSIS — I5022 Chronic systolic (congestive) heart failure: Secondary | ICD-10-CM

## 2020-12-15 DIAGNOSIS — Z8249 Family history of ischemic heart disease and other diseases of the circulatory system: Secondary | ICD-10-CM | POA: Diagnosis not present

## 2020-12-15 DIAGNOSIS — M25519 Pain in unspecified shoulder: Secondary | ICD-10-CM | POA: Diagnosis not present

## 2020-12-15 DIAGNOSIS — I10 Essential (primary) hypertension: Secondary | ICD-10-CM

## 2020-12-15 MED ORDER — SACUBITRIL-VALSARTAN 24-26 MG PO TABS: 1.0000 | ORAL_TABLET | Freq: Two times a day (BID) | ORAL | 3 refills | Status: DC

## 2020-12-15 NOTE — Patient Instructions (Addendum)
Continue weighing daily and call for an overnight weight gain of > 2 pounds or a weekly weight gain of >5 pounds.    Begin entresto 24/26mg  as 1 tablet twice a day

## 2020-12-16 ENCOUNTER — Ambulatory Visit: Payer: Medicare PPO | Admitting: Family

## 2020-12-21 ENCOUNTER — Telehealth: Payer: Self-pay

## 2021-01-03 ENCOUNTER — Ambulatory Visit: Payer: Medicare PPO | Admitting: Family

## 2021-01-10 ENCOUNTER — Encounter: Payer: Self-pay | Admitting: Nurse Practitioner

## 2021-01-10 ENCOUNTER — Ambulatory Visit (INDEPENDENT_AMBULATORY_CARE_PROVIDER_SITE_OTHER): Payer: Medicare PPO | Admitting: Nurse Practitioner

## 2021-01-10 ENCOUNTER — Other Ambulatory Visit: Payer: Self-pay

## 2021-01-10 VITALS — BP 120/80 | HR 118 | Ht 66.0 in | Wt 200.0 lb

## 2021-01-10 DIAGNOSIS — I2510 Atherosclerotic heart disease of native coronary artery without angina pectoris: Secondary | ICD-10-CM

## 2021-01-10 DIAGNOSIS — R000 Tachycardia, unspecified: Secondary | ICD-10-CM

## 2021-01-10 DIAGNOSIS — I10 Essential (primary) hypertension: Secondary | ICD-10-CM | POA: Diagnosis not present

## 2021-01-10 DIAGNOSIS — E785 Hyperlipidemia, unspecified: Secondary | ICD-10-CM | POA: Diagnosis not present

## 2021-01-10 DIAGNOSIS — I428 Other cardiomyopathies: Secondary | ICD-10-CM | POA: Diagnosis not present

## 2021-01-10 DIAGNOSIS — I340 Nonrheumatic mitral (valve) insufficiency: Secondary | ICD-10-CM

## 2021-01-10 DIAGNOSIS — I5022 Chronic systolic (congestive) heart failure: Secondary | ICD-10-CM

## 2021-01-10 MED ORDER — METOPROLOL SUCCINATE ER 25 MG PO TB24: 25.0000 mg | ORAL_TABLET | Freq: Every day | ORAL | 3 refills | Status: DC

## 2021-01-10 MED ORDER — ASPIRIN 81 MG PO CHEW: 81.0000 mg | CHEWABLE_TABLET | Freq: Every day | ORAL | 0 refills | Status: DC

## 2021-01-10 MED ORDER — ATORVASTATIN CALCIUM 40 MG PO TABS: 40.0000 mg | ORAL_TABLET | Freq: Every day | ORAL | 3 refills | Status: DC

## 2021-01-10 NOTE — Patient Instructions (Signed)
Medication Instructions:  Medictions refilled.   New prescription for Atorvastatin 40 mg is now changed so take 1 tablet once daily.   *If you need a refill on your cardiac medications before your next appointment, please call your pharmacy*   Lab Work: CBC and BMET done today  If you have labs (blood work) drawn today and your tests are completely normal, you will receive your results only by: MyChart Message (if you have MyChart) OR A paper copy in the mail If you have any lab test that is abnormal or we need to change your treatment, we will call you to review the results.   Testing/Procedures: None   Follow-Up: At [facility], you and your health needs are our priority.  As part of our continuing mission to provide you with exceptional heart care, we have created designated Provider Care Teams.  These Care Teams include your primary Cardiologist (physician) and Advanced Practice Providers (APPs -  Physician Assistants and Nurse Practitioners) who all work together to provide you with the care you need, when you need it.  We recommend signing up for the patient portal called "MyChart".  Sign up information is provided on this After Visit Summary.  MyChart is used to connect with patients for Virtual Visits (Telemedicine).  Patients are able to view lab/test results, encounter notes, upcoming appointments, etc.  Non-urgent messages can be sent to your provider as well.   To learn more about what you can do with MyChart, go to [email or web address].    Your next appointment:   6 week(s)  The format for your next appointment:   In Person  Provider:   Yvonne Kendall, MD or Nicolasa Ducking, NP

## 2021-01-10 NOTE — Progress Notes (Signed)
Office Visit    Patient Name: Evelyn Dunn Date of Encounter: 01/10/2021  Primary Care Provider:  Pcp, No Primary Cardiologist:  Yvonne Kendall, MD  Chief Complaint    70 year old female with history of hypertension, CKD III, and tobacco abuse who presents for follow-up after hospitalization in June 2022 for HFrEF, NICM, moderate to severe mitral regurgitation, and LBBB.  Past Medical History    Past Medical History:  Diagnosis Date   Chronic HFrEF (heart failure with reduced ejection fraction) (HCC)    a. 11/2020 Echo: EF 20-25%, glob HK. Nl RV size/fxn, sev BAE. Mod-sev MR. Mild AI; b. 11/2020 Cath: CO/CI 3.6/1.9 respectively.   CKD (chronic kidney disease), stage III (HCC)    Hypertension    a. 11/2020 Renal Duplex: No high grade RAS.   LBBB (left bundle branch block)    Moderate to severe mitral regurgitation    NICM (nonischemic cardiomyopathy) (HCC)    a. 11/2020 Echo: EF 20-25%, glob HK; b. 11/2020 Cath: nonobs dzs.   Nonobstructive CAD (coronary artery disease)    a. 11/2020 Cath: LM nl, LAD 25p, 65/53m, D2 30, LCX 30m, OM1 mod dzs, RCA mild diff dzs, RPDA 20-->Med rx.   Past Surgical History:  Procedure Laterality Date   RIGHT/LEFT HEART CATH AND CORONARY ANGIOGRAPHY N/A 12/05/2020   Procedure: RIGHT/LEFT HEART CATH AND CORONARY ANGIOGRAPHY;  Surgeon: Yvonne Kendall, MD;  Location: [facility] INVASIVE CV LAB;  Service: Cardiovascular;  Laterality: N/A;    Allergies  No Known Allergies  History of Present Illness    70 year old female with a history of hypertension, stage III chronic kidney disease, and tobacco abuse.  She was recently admitted to [facility] on June 10 with progressive dyspnea and hypertensive urgency, and was found to have acute heart failure with an EF of 20 to 25% and global hypokinesis by echocardiogram.  She also had a new left bundle branch block however, presentation was not felt to be consistent with ACS.  Moderate to severe mitral  regurgitation was also noted on echo.  She initially responded to IV Lasix but also developed hypotension and had a rise in creatinine to 2.75, resulting in holding diuretic and ARB therapy.  She underwent diagnostic catheterization revealing moderate, nonobstructive disease with elevated right heart pressures.  She was placed back on diuretic therapy with good diuresis and discharge weight of 91.1 kg.  She was discharged home on Entresto, Toprol-XL, BiDil, aspirin and statin therapy.   Creatinine was 1.32 at discharge.  She followed in heart failure clinic on June 30 and weight was down to 89.1 kg.  She was not taking Entresto or BiDil and was given a Chiropodist for [employer].  Since her heart failure appointment, she has been feeling well.  She notes that her weight is up 2 pounds but she does not think it is fluid.  She says she has fallen in love with milkshakes.  She has been walking some each day, usually up to a block or 2 without symptoms or limitations.  She has been compliant with Entresto but notes that since her heart failure visit, she ran out of atorvastatin and metoprolol.  She has not had any symptoms but her heart rate is 118 today.  She denies chest pain, dyspnea, palpitations, PND, orthopnea, dizziness, syncope, edema, or early satiety.  Home Medications    Current Outpatient Medications  Medication Sig Dispense Refill   aspirin 81 MG chewable tablet Chew 81 mg by mouth daily.  atorvastatin (LIPITOR) 20 MG tablet Take 2 tablets (40 mg total) by mouth daily. 30 tablet 0   metoprolol succinate (TOPROL-XL) 25 MG 24 hr tablet Take 1 tablet (25 mg total) by mouth daily. 30 tablet 0   sacubitril-valsartan (ENTRESTO) 24-26 MG Take 1 tablet by mouth 2 (two) times daily. 60 tablet 3   No current facility-administered medications for this visit.    Review of Systems    Overall feels well.  She denies chest pain, palpitations, dyspnea, pnd, orthopnea, n, v, dizziness, syncope, edema, weight  gain, or early satiety.  All other systems reviewed and are otherwise negative except as noted above.  Physical Exam    VS:  BP 120/80 (BP Location: Left Arm, Patient Position: Sitting, Cuff Size: Normal)   Pulse (!) 118   Ht 5\' 6"  (1.676 m)   Wt 200 lb (90.7 kg)   SpO2 93%   BMI 32.28 kg/m  , BMI Body mass index is 32.28 kg/m.     GEN: Well nourished, well developed, in no acute distress. HEENT: normal. Neck: Supple, no JVD, carotid bruits, or masses. Cardiac: RRR, tachycardic, no murmurs, rubs, or gallops. No clubbing, cyanosis, edema.  Radials 2+/PT 1+ and equal bilaterally.  Respiratory:  Respirations regular and unlabored, clear to auscultation bilaterally. GI: Soft, nontender, nondistended, BS + x 4. MS: no deformity or atrophy. Skin: warm and dry, no rash. Neuro:  Strength and sensation are intact. Psych: Normal affect.  Accessory Clinical Findings    ECG personally reviewed by me today -sinus tachycardia, 118, left bundle branch block- no acute changes.  Lab Results  Component Value Date   WBC 6.7 12/07/2020   HGB 14.7 12/07/2020   HCT 44.2 12/07/2020   MCV 93.6 12/07/2020   PLT 246 12/07/2020   Lab Results  Component Value Date   CREATININE 1.32 (H) 12/08/2020   BUN 28 (H) 12/08/2020   NA 136 12/08/2020   K 3.8 12/08/2020   CL 102 12/08/2020   CO2 26 12/08/2020   Lab Results  Component Value Date   ALT 8 11/25/2020   AST 28 11/25/2020   ALKPHOS 45 11/25/2020   BILITOT 1.6 (H) 11/25/2020   Lab Results  Component Value Date   CHOL 162 11/25/2020   HDL 41 11/25/2020   LDLCALC 101 (H) 11/25/2020   TRIG 98 11/25/2020   CHOLHDL 4.0 11/25/2020    Lab Results  Component Value Date   HGBA1C 6.4 (H) 11/25/2020   Assessment & Plan    1.  Chronic heart failure with reduced ejection fraction/nonischemic cardiomyopathy: EF 20 to 25% by echo in June with catheterization at that time showing moderate, nonobstructive CAD.  Though she was previously prescribed  metoprolol, BiDil, and Entresto, at heart failure follow-up recently, she was not taking Entresto or BiDil secondary to cost.  Since then, she has been able to take [place], after being given a voucher.  Unfortunately, she ran out of her metoprolol.  Fortunately, she feels well and is euvolemic on examination today.  She is tachycardic however at 118 bpm (sinus tachycardia).  I am resuming beta-blocker therapy today and stressed the importance of compliance and also of notifying [place] if she ever runs out.  As I am adding back beta-blocker, I will hold off on adding spironolactone/SGLT2 inhibitor.  I suspect an SGLT2 inhibitor will be cost prohibitive.  We discussed the importance of daily weights, sodium restriction, medication compliance, and symptom reporting and she verbalizes understanding.  She has follow-up with  heart failure clinic in about 2 weeks.  If she remains tachycardic at that time, I would have a low threshold to consider digoxin versus ivabradine, depending on renal function, which I will check today.  2.  Sinus tachycardia: In the setting of #1 with concern for low output.  Fortunately, she is euvolemic.  Resuming metoprolol today.  I will check a CBC and basic metabolic panel as well.  3.  Moderate to severe mitral regurgitation: Noted on echo in the setting of above/LV dilation.  Currently asymptomatic.  I do not appreciate a murmur on exam today.  Continue Entresto and resume beta-blocker.  We will plan to follow-up an echocardiogram in 2 to 3 months to reevaluate LV function and MR.  4.  Essential hypertension: Previously poorly controlled but well controlled today at 120/80.  Renal arterial duplex without significant renal artery stenosis in June.  Resuming metoprolol.  5.  Nonobstructive CAD: Moderate, nonobstructive disease by catheterization in June.  She remains on aspirin therapy.  Refilling beta-blocker and statin today.  6.  Hyperlipidemia: LDL of 101 in June.  Resuming statin  therapy.  Plan to follow-up fasting lipids and LFTs at next visit.  7.  Disposition: Follow-up CBC and basic metabolic panel today.  She will follow-up with heart failure clinic in approximately 2 weeks and here in about 6 weeks.   Nicolasa Ducking, NP 01/10/2021, 3:18 PM

## 2021-01-12 ENCOUNTER — Telehealth: Payer: Self-pay | Admitting: *Deleted

## 2021-01-12 LAB — BASIC METABOLIC PANEL
BUN/Creatinine Ratio: 20 (ref 12–28)
BUN: 25 mg/dL (ref 8–27)
CO2: 15 mmol/L — ABNORMAL LOW (ref 20–29)
Calcium: 10 mg/dL (ref 8.7–10.3)
Chloride: 104 mmol/L (ref 96–106)
Creatinine, Ser: 1.27 mg/dL — ABNORMAL HIGH (ref 0.57–1.00)
Glucose: 167 mg/dL — ABNORMAL HIGH (ref 65–99)
Potassium: 4.8 mmol/L (ref 3.5–5.2)
Sodium: 140 mmol/L (ref 134–144)
eGFR: 46 mL/min/{1.73_m2} — ABNORMAL LOW (ref >59)

## 2021-01-12 LAB — CBC

## 2021-01-12 NOTE — Telephone Encounter (Signed)
Reviewed results and recommendations with patient and she verbalized understanding with no further questions at this time.  

## 2021-01-12 NOTE — Telephone Encounter (Signed)
No answer/No voicemail box has been set up. 

## 2021-01-12 NOTE — Telephone Encounter (Signed)
-----   Message from Creig Hines, NP sent at 01/12/2021  7:17 AM EDT ----- Lab unable to run blood counts.  Kidney function and electrolytes are stable.  Continue current medicines.

## 2021-01-18 ENCOUNTER — Ambulatory Visit: Payer: Medicare PPO | Admitting: Medical

## 2021-01-31 ENCOUNTER — Ambulatory Visit: Payer: Medicare PPO | Attending: Family | Admitting: Family

## 2021-01-31 ENCOUNTER — Encounter: Payer: Self-pay | Admitting: Family

## 2021-01-31 ENCOUNTER — Other Ambulatory Visit: Payer: Self-pay

## 2021-01-31 VITALS — BP 143/82 | HR 78 | Resp 20 | Ht 66.0 in | Wt 202.0 lb

## 2021-01-31 DIAGNOSIS — F17210 Nicotine dependence, cigarettes, uncomplicated: Secondary | ICD-10-CM | POA: Insufficient documentation

## 2021-01-31 DIAGNOSIS — I130 Hypertensive heart and chronic kidney disease with heart failure and stage 1 through stage 4 chronic kidney disease, or unspecified chronic kidney disease: Secondary | ICD-10-CM | POA: Insufficient documentation

## 2021-01-31 DIAGNOSIS — Z8249 Family history of ischemic heart disease and other diseases of the circulatory system: Secondary | ICD-10-CM | POA: Diagnosis not present

## 2021-01-31 DIAGNOSIS — I447 Left bundle-branch block, unspecified: Secondary | ICD-10-CM | POA: Diagnosis not present

## 2021-01-31 DIAGNOSIS — N183 Chronic kidney disease, stage 3 unspecified: Secondary | ICD-10-CM | POA: Diagnosis not present

## 2021-01-31 DIAGNOSIS — I5022 Chronic systolic (congestive) heart failure: Secondary | ICD-10-CM | POA: Diagnosis present

## 2021-01-31 DIAGNOSIS — Z79899 Other long term (current) drug therapy: Secondary | ICD-10-CM | POA: Insufficient documentation

## 2021-01-31 DIAGNOSIS — Z720 Tobacco use: Secondary | ICD-10-CM

## 2021-01-31 DIAGNOSIS — I2510 Atherosclerotic heart disease of native coronary artery without angina pectoris: Secondary | ICD-10-CM | POA: Diagnosis not present

## 2021-01-31 DIAGNOSIS — I428 Other cardiomyopathies: Secondary | ICD-10-CM | POA: Diagnosis not present

## 2021-01-31 DIAGNOSIS — I10 Essential (primary) hypertension: Secondary | ICD-10-CM

## 2021-01-31 MED ORDER — DAPAGLIFLOZIN PROPANEDIOL 10 MG PO TABS: 10.0000 mg | ORAL_TABLET | Freq: Every day | ORAL | 5 refills | Status: DC

## 2021-01-31 NOTE — Progress Notes (Signed)
Patient ID: Evelyn Dunn, female    DOB: Feb 13, 1951, 70 y.o.   MRN: [id number]  HPI  Evelyn Dunn is a 70 y/o female with a history of HTN,  tobacco use and chronic heart failure.   Echo report from 11/26/20 reviewed and showed an EF of 20-25% along with moderate/ severe MR.   LHC/RHC done 12/15/20 showed: Moderate to severe single-vessel coronary artery disease with 20-30% proximal and tandem 50-70% mid LAD stenoses.  There is mild disease involving the LCx and RCA.  Severity of cardiomyopathy is out of proportion to degree of coronary artery disease suggesting primarily non-ischemic etiology. Moderate to severely elevated left heart filling pressure. Severely elevated right heart and pulmonary artery pressures. Moderately reduced Fick cardiac output/index.  Admitted 11/25/20 due to acute HF. Initially thought to be STEMI but this was dismissed. Cardiology consult obtained. Initially given IV lasix with transition to oral diuretics. Leg doppler negative for DVT. Discharged after 14 days.   She presents today for a follow-up visit with a chief complaint of minimal fatigue upon moderate exertion. She describes this as chronic in nature having been present for several years. She has associated right shoulder pain along with this. She denies any difficulty sleeping, dizziness, abdominal distention, palpitations, pedal edema, chest pain, shortness of breath or cough.   Admits to not weighing herself daily but does have scales at home.   Past Medical History:  Diagnosis Date   Chronic HFrEF (heart failure with reduced ejection fraction) (HCC)    a. 11/2020 Echo: EF 20-25%, glob HK. Nl RV size/fxn, sev BAE. Mod-sev MR. Mild AI; b. 11/2020 Cath: CO/CI 3.6/1.9 respectively.   CKD (chronic kidney disease), stage III (HCC)    Hypertension    a. 11/2020 Renal Duplex: No high grade RAS.   LBBB (left bundle branch block)    Moderate to severe mitral regurgitation    NICM (nonischemic cardiomyopathy) (HCC)     a. 11/2020 Echo: EF 20-25%, glob HK; b. 11/2020 Cath: nonobs dzs.   Nonobstructive CAD (coronary artery disease)    a. 11/2020 Cath: LM nl, LAD 25p, 65/27m, D2 30, LCX 71m, OM1 mod dzs, RCA mild diff dzs, RPDA 20-->Med rx.   Past Surgical History:  Procedure Laterality Date   RIGHT/LEFT HEART CATH AND CORONARY ANGIOGRAPHY N/A 12/05/2020   Procedure: RIGHT/LEFT HEART CATH AND CORONARY ANGIOGRAPHY;  Surgeon: Yvonne Kendall, MD;  Location: [facility] INVASIVE CV [facility];  Service: Cardiovascular;  Laterality: N/A;   Family History  Problem Relation Age of Onset   Valvular heart disease Mother    Heart failure Father    Coronary artery disease Sister    Vascular Disease Brother    Social History   Tobacco Use   Smoking status: Every Day    Packs/day: 0.25    Types: Cigarettes   Smokeless tobacco: Never  Substance Use Topics   Alcohol use: Yes    Alcohol/week: 5.0 standard drinks    Types: 1 Cans of beer, 4 Standard drinks or equivalent per week   No Known Allergies  Prior to Admission medications   Medication Sig Start Date End Date Taking? Authorizing Provider  aspirin 81 MG chewable tablet Chew 1 tablet (81 mg total) by mouth daily. 01/10/21  Yes Creig Hines, NP  atorvastatin (LIPITOR) 40 MG tablet Take 1 tablet (40 mg total) by mouth daily. 01/10/21 04/10/21 Yes Creig Hines, NP  metoprolol succinate (TOPROL-XL) 25 MG 24 hr tablet Take 1 tablet (25 mg total) by mouth  daily. 01/10/21 02/09/21 Yes Creig Hines, NP  sacubitril-valsartan (ENTRESTO) 24-26 MG Take 1 tablet by mouth 2 (two) times daily. 12/15/20  Yes Clarisa Kindred A, FNP  atorvastatin (LIPITOR) 20 MG tablet Take 2 tablets (40 mg total) by mouth daily. 12/09/20 01/10/21  Lynn Ito, MD    Review of Systems  Constitutional:  Positive for fatigue. Negative for appetite change.  HENT:  Negative for congestion, postnasal drip and sore throat.   Eyes: Negative.   Respiratory:  Negative for  cough, chest tightness and shortness of breath.   Cardiovascular:  Negative for chest pain, palpitations and leg swelling.  Gastrointestinal:  Negative for abdominal distention and abdominal pain.  Endocrine: Negative.   Genitourinary: Negative.   Musculoskeletal:  Positive for arthralgias (shoulder pain). Negative for back pain and neck pain.  Skin: Negative.   Allergic/Immunologic: Negative.   Neurological:  Negative for dizziness and light-headedness.  Hematological:  Negative for adenopathy. Does not bruise/bleed easily.  Psychiatric/Behavioral:  Negative for dysphoric mood and sleep disturbance (sleeping on 3 pillows). The patient is not nervous/anxious.    Vitals:   01/31/21 1318  BP: (!) 143/82  Pulse: 78  Resp: 20  SpO2: 98%  Weight: 202 lb (91.6 kg)  Height: 5\' 6"  (1.676 m)   Wt Readings from Last 3 Encounters:  01/31/21 202 lb (91.6 kg)  01/10/21 200 lb (90.7 kg)  12/15/20 196 lb 8 oz (89.1 kg)   Lab Results  Component Value Date   CREATININE 1.27 (H) 01/10/2021   CREATININE 1.32 (H) 12/08/2020   CREATININE 1.44 (H) 12/07/2020    Physical Exam Vitals and nursing note reviewed.  Constitutional:      Appearance: Normal appearance.  HENT:     Head: Normocephalic and atraumatic.  Cardiovascular:     Rate and Rhythm: Normal rate and regular rhythm.  Pulmonary:     Effort: Pulmonary effort is normal. No respiratory distress.     Breath sounds: No wheezing or rales.  Abdominal:     General: There is no distension.     Palpations: Abdomen is soft.  Musculoskeletal:        General: No tenderness.     Cervical back: Normal range of motion and neck supple.     Right lower leg: No edema.     Left lower leg: No edema.  Skin:    General: Skin is warm and dry.  Neurological:     General: No focal deficit present.     Mental Status: She is alert and oriented to person, place, and time.  Psychiatric:        Mood and Affect: Mood normal.        Behavior: Behavior  normal.        Thought Content: Thought content normal.    Assessment & Plan:  1: Chronic heart failure with reduced ejection fraction- - NYHA class II - euvolemic today - not weighing daily; instructed to weigh daily and call for an overnight weight gain of > 2 pounds or a weekly weight gain of > 5 pounds - weight up 6 pounds from last visit here 6 weeks ago - not adding salt to her food and is trying to closely read food labels for sodium content - entresto 24/26mg  BID started at last visit; she got approved for 12/09/2020 patient assistance and will get a 90 day supply mailed to her home - on GDMT of entresto and metoprolol - will add farxiga 10mg  daily today; 30 day voucher  given to her and patient assistance filled out for this medication as well - consider adding spironolactone or titrating up entresto/ metoprolol at future visits - saw cardiology Brion Aliment) 01/10/21; returns 02/22/21 - BNP 12/07/20 was 251.0  2: HTN- - BP mildly elevated (143/82) - planning to contact [facility] to get established with them - BMP 01/10/21 reviewed and showed sodium 140, potassium 4.8, creatinine 1.27 and GFR 46  3: Tobacco use- - complete cessation discussed for 3 minutes - smoking 1 cigarette  daily   Medication bottles reviewed.   Return in 6 weeks or sooner for any questions/problems before then. Plan to check BMP at that time if not done at cardiology visit.

## 2021-01-31 NOTE — Patient Instructions (Addendum)
Begin weighing daily and call for an overnight weight gain of > 2 pounds or a weekly weight gain of >5 pounds.    Take farxiga voucher to your pharmacy and you will get the first month free of charge. We are applying to the company for patient assistance

## 2021-02-14 ENCOUNTER — Telehealth: Payer: Self-pay | Admitting: Family

## 2021-02-14 NOTE — Telephone Encounter (Signed)
Spoke to [employer] patient assistance who confirmed patient has been approved for [employer] back in July. Clinic was not previously notified however patient was notified and has been receiving mail order perscriptions since and is aware of the [employer] approval till the end of the calendar year.   Kc Summerson, NT

## 2021-02-22 ENCOUNTER — Ambulatory Visit: Payer: Medicare PPO | Admitting: Internal Medicine

## 2021-03-11 NOTE — Progress Notes (Deleted)
Patient ID: [name], female    DOB: January 25, 1951, 70 y.o.   MRN: [id number]  HPI  Ms Chatterjee is a 70 y/o female with a history of HTN,  tobacco use and chronic heart failure.   Echo report from 11/26/20 reviewed and showed an EF of 20-25% along with moderate/ severe MR.   LHC/RHC done 12/15/20 showed: Moderate to severe single-vessel coronary artery disease with 20-30% proximal and tandem 50-70% mid LAD stenoses.  There is mild disease involving the LCx and RCA.  Severity of cardiomyopathy is out of proportion to degree of coronary artery disease suggesting primarily non-ischemic etiology. Moderate to severely elevated left heart filling pressure. Severely elevated right heart and pulmonary artery pressures. Moderately reduced Fick cardiac output/index.  Admitted 11/25/20 due to acute HF. Initially thought to be STEMI but this was dismissed. Cardiology consult obtained. Initially given IV lasix with transition to oral diuretics. Leg doppler negative for DVT. Discharged after 14 days.   She presents today for a follow-up visit with a chief complaint of minimal fatigue upon moderate exertion. She describes this as chronic in nature having been present for several years. She has associated gradual weight gain along with this. She denies any dizziness, difficulty sleeping, abdominal distention, palpitations, pedal edema, chest pain, shortness of breath or cough.   Started on farxiga at last visit and says that she doesn't notice any problems with taking it.   Past Medical History:  Diagnosis Date   Chronic HFrEF (heart failure with reduced ejection fraction) (HCC)    a. 11/2020 Echo: EF 20-25%, glob HK. Nl RV size/fxn, sev BAE. Mod-sev MR. Mild AI; b. 11/2020 Cath: CO/CI 3.6/1.9 respectively.   CKD (chronic kidney disease), stage III (HCC)    Hypertension    a. 11/2020 Renal Duplex: No high grade RAS.   LBBB (left bundle branch block)    Moderate to severe mitral regurgitation    NICM  (nonischemic cardiomyopathy) (HCC)    a. 11/2020 Echo: EF 20-25%, glob HK; b. 11/2020 Cath: nonobs dzs.   Nonobstructive CAD (coronary artery disease)    a. 11/2020 Cath: LM nl, LAD 25p, 65/52m, D2 30, LCX 35m, OM1 mod dzs, RCA mild diff dzs, RPDA 20-->Med rx.   Past Surgical History:  Procedure Laterality Date   RIGHT/LEFT HEART CATH AND CORONARY ANGIOGRAPHY N/A 12/05/2020   Procedure: RIGHT/LEFT HEART CATH AND CORONARY ANGIOGRAPHY;  Surgeon: Yvonne Kendall, MD;  Location: [facility] INVASIVE CV LAB;  Service: Cardiovascular;  Laterality: N/A;   Family History  Problem Relation Age of Onset   Valvular heart disease Mother    Heart failure Father    Coronary artery disease Sister    Vascular Disease Brother    Social History   Tobacco Use   Smoking status: Every Day    Packs/day: 0.25    Types: Cigarettes   Smokeless tobacco: Never  Substance Use Topics   Alcohol use: Yes    Alcohol/week: 5.0 standard drinks    Types: 1 Cans of beer, 4 Standard drinks or equivalent per week   No Known Allergies  Prior to Admission medications   Medication Sig Start Date End Date Taking? Authorizing Provider  aspirin 81 MG chewable tablet Chew 1 tablet (81 mg total) by mouth daily. 01/10/21  Yes Creig Hines, NP  atorvastatin (LIPITOR) 40 MG tablet Take 1 tablet (40 mg total) by mouth daily. 01/10/21 04/10/21 Yes Creig Hines, NP  dapagliflozin propanediol (FARXIGA) 10 MG TABS tablet Take 1 tablet (10  mg total) by mouth daily before breakfast. 01/31/21  Yes Clarisa Kindred A, FNP  metoprolol succinate (TOPROL-XL) 25 MG 24 hr tablet Take 1 tablet (25 mg total) by mouth daily. 01/10/21  Yes Creig Hines, NP  sacubitril-valsartan (ENTRESTO) 24-26 MG Take 1 tablet by mouth 2 (two) times daily. 12/15/20  Yes Clarisa Kindred A, FNP  atorvastatin (LIPITOR) 20 MG tablet Take 2 tablets (40 mg total) by mouth daily. Patient not taking: Reported on 03/13/2021 12/09/20   Lynn Ito, MD    Review of Systems  Constitutional:  Positive for fatigue. Negative for appetite change.  HENT:  Negative for congestion, postnasal drip and sore throat.   Eyes: Negative.   Respiratory:  Negative for cough, chest tightness and shortness of breath.   Cardiovascular:  Negative for chest pain, palpitations and leg swelling.  Gastrointestinal:  Negative for abdominal distention and abdominal pain.  Endocrine: Negative.   Genitourinary: Negative.   Musculoskeletal:  Positive for arthralgias (shoulder pain). Negative for back pain and neck pain.  Skin: Negative.   Allergic/Immunologic: Negative.   Neurological:  Negative for dizziness and light-headedness.  Hematological:  Negative for adenopathy. Does not bruise/bleed easily.  Psychiatric/Behavioral:  Negative for dysphoric mood and sleep disturbance (sleeping on 3 pillows). The patient is not nervous/anxious.    Vitals:   03/13/21 1317  BP: (!) 141/63  Pulse: 77  Resp: 18  SpO2: 100%  Weight: 206 lb (93.4 kg)  Height: 5\' 6"  (1.676 m)   Wt Readings from Last 3 Encounters:  03/13/21 206 lb (93.4 kg)  01/31/21 202 lb (91.6 kg)  01/10/21 200 lb (90.7 kg)   Lab Results  Component Value Date   CREATININE 1.43 (H) 03/13/2021   CREATININE 1.27 (H) 01/10/2021   CREATININE 1.32 (H) 12/08/2020   Physical Exam Vitals and nursing note reviewed.  Constitutional:      Appearance: Normal appearance.  HENT:     Head: Normocephalic and atraumatic.  Cardiovascular:     Rate and Rhythm: Normal rate and regular rhythm.  Pulmonary:     Effort: Pulmonary effort is normal. No respiratory distress.     Breath sounds: No wheezing or rales.  Abdominal:     General: There is no distension.     Palpations: Abdomen is soft.  Musculoskeletal:        General: No tenderness.     Cervical back: Normal range of motion and neck supple.     Right lower leg: No edema.     Left lower leg: No edema.  Skin:    General: Skin is warm and dry.   Neurological:     General: No focal deficit present.     Mental Status: She is alert and oriented to person, place, and time.  Psychiatric:        Mood and Affect: Mood normal.        Behavior: Behavior normal.        Thought Content: Thought content normal.   Assessment & Plan:  1: Chronic heart failure with reduced ejection fraction- - NYHA class II - euvolemic today - not weighing daily; instructed to weigh daily and call for an overnight weight gain of > 2 pounds or a weekly weight gain of > 5 pounds - weight up 4 pounds from last visit here 6 weeks ago - not adding salt to her food and is trying to closely read food labels for sodium content - on GDMT of entresto, dapagliflozin and metoprolol - farxiga 10mg   started at last visit; will get BMP today - consider adding spironolactone or titrating up entresto/ metoprolol at future visits - saw cardiology Brion Aliment) 01/10/21; returns 03/16/21 - BNP 12/07/20 was 251.0  2: HTN- - BP minimally elevated (141/63) - planning to contact [facility] to get established with them - BMP 01/10/21 reviewed and showed sodium 140, potassium 4.8, creatinine 1.27 and GFR 46  3: Tobacco use- - complete cessation discussed for 3 minutes - smoking 1 cigarette  daily   Medication list reviewed.   Return in 2 months or sooner for any questions/problems before then.

## 2021-03-13 ENCOUNTER — Ambulatory Visit (HOSPITAL_BASED_OUTPATIENT_CLINIC_OR_DEPARTMENT_OTHER): Payer: Medicare PPO | Admitting: Family

## 2021-03-13 ENCOUNTER — Other Ambulatory Visit: Payer: Self-pay

## 2021-03-13 ENCOUNTER — Other Ambulatory Visit
Admission: RE | Admit: 2021-03-13 | Discharge: 2021-03-13 | Disposition: A | Discharge status: Home / Self Care | Payer: Medicare PPO | Source: Ambulatory Visit | Attending: Family | Admitting: Family

## 2021-03-13 ENCOUNTER — Encounter: Payer: Self-pay | Admitting: Family

## 2021-03-13 VITALS — BP 141/63 | HR 77 | Resp 18 | Ht 66.0 in | Wt 206.0 lb

## 2021-03-13 DIAGNOSIS — Z720 Tobacco use: Secondary | ICD-10-CM | POA: Diagnosis not present

## 2021-03-13 DIAGNOSIS — I5022 Chronic systolic (congestive) heart failure: Secondary | ICD-10-CM | POA: Insufficient documentation

## 2021-03-13 DIAGNOSIS — I10 Essential (primary) hypertension: Secondary | ICD-10-CM

## 2021-03-13 LAB — BASIC METABOLIC PANEL
Anion gap: 6 (ref 5–15)
BUN: 37 mg/dL — ABNORMAL HIGH (ref 8–23)
CO2: 25 mmol/L (ref 22–32)
Calcium: 9.3 mg/dL (ref 8.9–10.3)
Chloride: 107 mmol/L (ref 98–111)
Creatinine, Ser: 1.43 mg/dL — ABNORMAL HIGH (ref 0.44–1.00)
GFR, Estimated: 39 mL/min — ABNORMAL LOW (ref >60)
Glucose, Bld: 153 mg/dL — ABNORMAL HIGH (ref 70–99)
Potassium: 3.9 mmol/L (ref 3.5–5.1)
Sodium: 138 mmol/L (ref 135–145)

## 2021-03-13 NOTE — Progress Notes (Signed)
Patient ID: Evelyn Dunn, female    DOB: 17-Jun-1951, 70 y.o.   MRN: [id number]  Ms Katzman is a 70 y/o female with a history of HTN,  tobacco use and chronic heart failure.   Echo report from 11/26/20 reviewed and showed an EF of 20-25% along with moderate/ severe MR.   LHC/RHC done 12/15/20 showed: Moderate to severe single-vessel coronary artery disease with 20-30% proximal and tandem 50-70% mid LAD stenoses.  There is mild disease involving the LCx and RCA.  Severity of cardiomyopathy is out of proportion to degree of coronary artery disease suggesting primarily non-ischemic etiology. Moderate to severely elevated left heart filling pressure. Severely elevated right heart and pulmonary artery pressures. Moderately reduced Fick cardiac output/index.  Admitted 11/25/20 due to acute HF. Initially thought to be STEMI but this was dismissed. Cardiology consult obtained. Initially given IV lasix with transition to oral diuretics. Leg doppler negative for DVT. Discharged after 14 days.   She presents today for a follow-up visit with a chief complaint of minimal fatigue upon moderate exertion. She describes this as chronic in nature having been present for several years. She denies any difficulty sleeping, dizziness, abdominal distention, palpitations, pedal edema, chest pain, shortness of breath or cough.   Admits to not weighing herself daily but does have scales at home.   Still trying to stop smoking.   Past Medical History:  Diagnosis Date   Chronic HFrEF (heart failure with reduced ejection fraction) (HCC)    a. 11/2020 Echo: EF 20-25%, glob HK. Nl RV size/fxn, sev BAE. Mod-sev MR. Mild AI; b. 11/2020 Cath: CO/CI 3.6/1.9 respectively.   CKD (chronic kidney disease), stage III (HCC)    Hypertension    a. 11/2020 Renal Duplex: No high grade RAS.   LBBB (left bundle branch block)    Moderate to severe mitral regurgitation    NICM (nonischemic cardiomyopathy) (HCC)    a. 11/2020 Echo: EF  20-25%, glob HK; b. 11/2020 Cath: nonobs dzs.   Nonobstructive CAD (coronary artery disease)    a. 11/2020 Cath: LM nl, LAD 25p, 65/89m, D2 30, LCX 42m, OM1 mod dzs, RCA mild diff dzs, RPDA 20-->Med rx.   Past Surgical History:  Procedure Laterality Date   RIGHT/LEFT HEART CATH AND CORONARY ANGIOGRAPHY N/A 12/05/2020   Procedure: RIGHT/LEFT HEART CATH AND CORONARY ANGIOGRAPHY;  Surgeon: Yvonne Kendall, MD;  Location: [facility] INVASIVE CV LAB;  Service: Cardiovascular;  Laterality: N/A;   Family History  Problem Relation Age of Onset   Valvular heart disease Mother    Heart failure Father    Coronary artery disease Sister    Vascular Disease Brother    Social History   Tobacco Use   Smoking status: Every Day    Packs/day: 0.25    Types: Cigarettes   Smokeless tobacco: Never  Substance Use Topics   Alcohol use: Not Currently    Alcohol/week: 7.0 standard drinks    Types: 7 Shots of liquor per week   No Known Allergies  Prior to Admission medications   Medication Sig Start Date End Date Taking? Authorizing Provider  aspirin 81 MG chewable tablet Chew 1 tablet (81 mg total) by mouth daily. 01/10/21  Yes Creig Hines, NP  atorvastatin (LIPITOR) 40 MG tablet Take 1 tablet (40 mg total) by mouth daily. 01/10/21 04/10/21 Yes Creig Hines, NP  metoprolol succinate (TOPROL-XL) 25 MG 24 hr tablet Take 1 tablet (25 mg total) by mouth daily. 01/10/21 02/09/21 Yes Creig Hines, NP  sacubitril-valsartan (ENTRESTO) 24-26 MG Take 1 tablet by mouth 2 (two) times daily. 12/15/20  Yes Clarisa Kindred A, FNP  atorvastatin (LIPITOR) 20 MG tablet Take 2 tablets (40 mg total) by mouth daily. 12/09/20 01/10/21  Lynn Ito, MD    Review of Systems  Constitutional:  Positive for fatigue. Negative for appetite change.  HENT:  Negative for congestion, postnasal drip and sore throat.   Eyes: Negative.   Respiratory:  Negative for cough, chest tightness and shortness of breath.    Cardiovascular:  Negative for chest pain, palpitations and leg swelling.  Gastrointestinal:  Negative for abdominal distention and abdominal pain.  Endocrine: Negative.   Genitourinary: Negative.   Musculoskeletal:  Negative for arthralgias, back pain and neck pain.  Skin: Negative.   Allergic/Immunologic: Negative.   Neurological:  Negative for dizziness and light-headedness.  Hematological:  Negative for adenopathy. Does not bruise/bleed easily.  Psychiatric/Behavioral:  Negative for dysphoric mood and sleep disturbance (sleeping on 3 pillows). The patient is not nervous/anxious.    Vitals:   03/13/21 1317  BP: (!) 141/63  Pulse: 77  Resp: 18  SpO2: 100%  Weight: 206 lb (93.4 kg)  Height: 5\' 6"  (1.676 m)   Wt Readings from Last 3 Encounters:  03/13/21 206 lb (93.4 kg)  01/31/21 202 lb (91.6 kg)  01/10/21 200 lb (90.7 kg)   Lab Results  Component Value Date   CREATININE 1.27 (H) 01/10/2021   CREATININE 1.32 (H) 12/08/2020   CREATININE 1.44 (H) 12/07/2020    Physical Exam Vitals and nursing note reviewed.  Constitutional:      Appearance: Normal appearance.  HENT:     Head: Normocephalic and atraumatic.  Cardiovascular:     Rate and Rhythm: Normal rate and regular rhythm.     Pulses: Normal pulses.     Heart sounds: No murmur heard.   No gallop.  Pulmonary:     Effort: Pulmonary effort is normal. No respiratory distress.     Breath sounds: No wheezing or rales.  Abdominal:     General: There is no distension.     Palpations: Abdomen is soft.  Musculoskeletal:        General: No tenderness.     Cervical back: Normal range of motion and neck supple.     Right lower leg: No edema.     Left lower leg: No edema.  Skin:    General: Skin is warm and dry.  Neurological:     General: No focal deficit present.     Mental Status: She is alert and oriented to person, place, and time.  Psychiatric:        Mood and Affect: Mood normal.        Behavior: Behavior  normal.        Thought Content: Thought content normal.    Assessment & Plan:  1: Chronic heart failure with reduced ejection fraction- - NYHA class II - euvolemic today - weighs intermittently; instructed to weigh daily and call for an overnight weight gain of > 2 pounds or a weekly weight gain of > 5 pounds - weight up 4 pounds from last visit, reports increased caloric intake - not adding salt to her food and is trying to closely read food labels for sodium content - on GDMT of entresto, farxiga and metoprolol - provided 2 week sample of farxiga as she's waiting to hear from patient assistance - BMP today since farxiga started at last visit - consider adding spironolactone or titrating up  entresto/ metoprolol at future visits - saw cardiology Brion Aliment) on 01/10/21; returns 03/16/21 - BNP 12/07/20 was 251.0  2: HTN- - BP mildly elevated (141/63) - BMP 01/10/21 reviewed and showed sodium 140, potassium 4.8, creatinine 1.27 and GFR 46  3: Tobacco use- - complete cessation discussed for 3 minutes, she continues to vacillate with smoking cessation    She did not bring her medications to this visit. Encouraged her to bring medications with her at each visit.   Return in 2 months or sooner for any questions/problems before then.

## 2021-03-13 NOTE — Patient Instructions (Signed)
Return in 2 months  Call clinic if you experience worsening SOB  Continue to work on stopping smoking  We will collect your labs today and call you with your results

## 2021-03-14 ENCOUNTER — Telehealth: Payer: Self-pay

## 2021-03-14 NOTE — Telephone Encounter (Addendum)
Patient notified by telephone of lab results and kidney function. Patient advised that labs will be rechecked at her next visit. Pt was told to call if any questions or concerns arise before then.  Suanne Marker, RN [facility]  ----- Message from Delma Freeze, FNP sent at 03/14/2021  8:46 AM EDT ----- Labs look good. Kidney function is within her range over the last several months. Will plan on rechecking it at her next visit

## 2021-03-14 NOTE — Progress Notes (Signed)
Cardiology Office Note:    Date:  03/16/2021   ID:  Evelyn Dunn, DOB 04/12/1951, MRN [id number]  PCP:  Oneita Hurt No  [facility] Cardiologist:  Yvonne Kendall, MD  [facility] Electrophysiologist:  None   Referring MD: No ref. provider found   Chief Complaint: 6 week follow-up  History of Present Illness:    Evelyn Dunn is a 70 y.o. female with a hx of HTN, CKD 3, HFrEF, NICM, mod to severe MR, LBBB, tobacco use who presents for NICM follow-up.   Admitted to [facility] in June 2022 with progressive dyspnea and hypertensive urgency and was found to have acute heart failure with EF 20-25%, global HK. She had a new LBBB, however presentation not felt to be consistent with ACS. Noted to have mod to severe MR. She was diuresed. Eventually underwent cardiac cath showing nonobstructive CAD and elevated RH pressures. Scr at discharge 1.32.   Seen in hospital follow-up 01/10/21 and was doing well. Had been intermittently following with heart failure clinic. She was euvolemic. She was tachycardic, metoprolol was resumed.   Has been following with heart failure clinic in the interim.   Today, the patient reports she has been doing well since the lat visit. Has some arthritis. She denies shortness of breath and chest pain. No LLE, orthopnea, pnd, palpitations, fever, chills, nausea, vomiting. EKG with NSR with LBBB, 74 bpm. No issues with cardiac medications.  Saw heart failure clinic and they started on Farxiga, they will re-check kidney function at the next visit.   Past Medical History:  Diagnosis Date   Chronic HFrEF (heart failure with reduced ejection fraction) (HCC)    a. 11/2020 Echo: EF 20-25%, glob HK. Nl RV size/fxn, sev BAE. Mod-sev MR. Mild AI; b. 11/2020 Cath: CO/CI 3.6/1.9 respectively.   CKD (chronic kidney disease), stage III (HCC)    Hypertension    a. 11/2020 Renal Duplex: No high grade RAS.   LBBB (left bundle branch block)    Moderate to severe mitral regurgitation    NICM  (nonischemic cardiomyopathy) (HCC)    a. 11/2020 Echo: EF 20-25%, glob HK; b. 11/2020 Cath: nonobs dzs.   Nonobstructive CAD (coronary artery disease)    a. 11/2020 Cath: LM nl, LAD 25p, 65/68m, D2 30, LCX 16m, OM1 mod dzs, RCA mild diff dzs, RPDA 20-->Med rx.    Past Surgical History:  Procedure Laterality Date   RIGHT/LEFT HEART CATH AND CORONARY ANGIOGRAPHY N/A 12/05/2020   Procedure: RIGHT/LEFT HEART CATH AND CORONARY ANGIOGRAPHY;  Surgeon: Yvonne Kendall, MD;  Location: [facility] INVASIVE CV LAB;  Service: Cardiovascular;  Laterality: N/A;    Current Medications: Current Meds  Medication Sig   aspirin 81 MG chewable tablet Chew 1 tablet (81 mg total) by mouth daily.   atorvastatin (LIPITOR) 40 MG tablet Take 1 tablet (40 mg total) by mouth daily.   dapagliflozin propanediol (FARXIGA) 10 MG TABS tablet Take 1 tablet (10 mg total) by mouth daily before breakfast.   metoprolol succinate (TOPROL-XL) 25 MG 24 hr tablet Take 1 tablet (25 mg total) by mouth daily.   sacubitril-valsartan (ENTRESTO) 24-26 MG Take 1 tablet by mouth 2 (two) times daily.     Allergies:   Patient has no known allergies.   Social History   Socioeconomic History   Marital status: Married    Spouse name: Not on file   Number of children: Not on file   Years of education: Not on file   Highest education level: Not on file  Occupational History   Not on file  Tobacco Use   Smoking status: Every Day    Packs/day: 0.25    Types: Cigarettes   Smokeless tobacco: Never  Substance and Sexual Activity   Alcohol use: Not Currently    Alcohol/week: 7.0 standard drinks    Types: 7 Shots of liquor per week   Drug use: Never   Sexual activity: Not on file  Other Topics Concern   Not on file  Social History Narrative   Not on file   Social Determinants of Health   Financial Resource Strain: Not on file  Food Insecurity: Not on file  Transportation Needs: Not on file  Physical Activity: Not on file  Stress: Not  on file  Social Connections: Not on file     Family History: The patient's family history includes Coronary artery disease in her sister; Heart failure in her father; Valvular heart disease in her mother; Vascular Disease in her brother.  ROS:   Please see the history of present illness.     All other systems reviewed and are negative.  EKGs/Labs/Other Studies Reviewed:    The following studies were reviewed today:  Echo 11/26/20 1. Left ventricular ejection fraction, by estimation, is 20 to 25%. The  left ventricle has severely decreased function. The left ventricle  demonstrates global hypokinesis. The left ventricular internal cavity size  was moderately dilated. There is mild  left ventricular hypertrophy. Left ventricular diastolic parameters are  indeterminate.   2. Right ventricular systolic function is moderately reduced. The right  ventricular size is normal.   3. Left atrial size was severely dilated.   4. Right atrial size was severely dilated.   5. The mitral valve is normal in structure. Moderate to severe mitral  valve regurgitation.   6. The aortic valve was not well visualized. Aortic valve regurgitation  is mild.   Cardiac cath 12/05/20 Conclusions: Moderate to severe single-vessel coronary artery disease with 20-30% proximal and tandem 50-70% mid LAD stenoses.  There is mild disease involving the LCx and RCA.  Severity of cardiomyopathy is out of proportion to degree of coronary artery disease suggesting primarily non-ischemic etiology. Moderate to severely elevated left heart filling pressure. Severely elevated right heart and pulmonary artery pressures. Moderately reduced Fick cardiac output/index.   Recommendations: Restart diuresis.  If creatinine worsens in the setting of diuresis, short-term inotropic support may be needed. Optimize goal-directed medical therapy.  Defer restarting ACEI/ARB until tomorrow in order to monitor renal function. Medical  therapy and risk factor modification to prevent progression of coronary artery disease.  Once the patient has been optimized from a heart failure standpoint, myocardial perfusion stress test should be considered to assess hemodynamic significance of LAD disease. Obtain renal artery Doppler to exclude renal artery stenosis in the setting of worsening renal function with addition of ACEI/ARB.   Yvonne Kendall, MD [facility]  EKG:  EKG is  ordered today.  The ekg ordered today demonstrates NSR, 74bpm, LBBB, nonspecific T wave changes  Recent Labs: 11/25/2020: ALT 8; Magnesium 2.2 12/06/2020: TSH 6.831 12/07/2020: B Natriuretic Peptide 251.0 01/10/2021: Hemoglobin CANCELED; Platelets CANCELED 03/13/2021: BUN 37; Creatinine, Ser 1.43; Potassium 3.9; Sodium 138  Recent Lipid Panel    Component Value Date/Time   CHOL 162 11/25/2020 1053   TRIG 98 11/25/2020 1053   HDL 41 11/25/2020 1053   CHOLHDL 4.0 11/25/2020 1053   VLDL 20 11/25/2020 1053   LDLCALC 101 (H) 11/25/2020 1053    Physical Exam:  VS:  BP 120/70 (BP Location: Left Arm, Patient Position: Sitting, Cuff Size: Normal)   Pulse 74   Ht 5\' 6"  (1.676 m)   Wt 209 lb 8 oz (95 kg)   SpO2 98%   BMI 33.81 kg/m     Wt Readings from Last 3 Encounters:  03/16/21 209 lb 8 oz (95 kg)  03/13/21 206 lb (93.4 kg)  01/31/21 202 lb (91.6 kg)     GEN:  Well nourished, well developed in no acute distress HEENT: Normal NECK: No JVD; No carotid bruits LYMPHATICS: No lymphadenopathy CARDIAC: RRR, no murmurs, rubs, gallops RESPIRATORY:  Clear to auscultation without rales, wheezing or rhonchi  ABDOMEN: Soft, non-tender, non-distended MUSCULOSKELETAL:  No edema; No deformity  SKIN: Warm and dry NEUROLOGIC:  Alert and oriented x 3 PSYCHIATRIC:  Normal affect   ASSESSMENT:    1. Non-ischemic cardiomyopathy (HCC)   2. Chronic systolic heart failure (HCC)   3. NICM (nonischemic cardiomyopathy) (HCC)   4. Sinus tachycardia   5.  Coronary artery disease involving native coronary artery of native heart without angina pectoris   6. Hyperlipidemia, mixed   7. Essential hypertension   8. Moderate to severe mitral regurgitation    PLAN:    In order of problems listed above:  Chronic heart failure with reduced EF NICM EF 20-25% by echo 11/2020 Patient is euvolemic on exam. She has been on Entresto, Toprol, recently started on Farxiga by heart failure clinic. They will re-check labs/kidney function at follow-up. Unsure she will tolerate spironolactone due to CKD. May need to consider Bidil.  I will order repeat Echo to assess pump function.   Sinus tachycardia Resolved. EKG shows NSR with heart rate 74bpm. Continue BB.   Mod to severe MR Murmur on exam. No acute heart failure symptoms. Repeat echo as above  HTN BP wnl. Continue Toprol and Entresto  Nonobstructive CAD By cath 12/05/20. She denies chest pain. Continue Aspirin, statin, and BB.   HLD LDL 101 June 2022. Continue Lipitor 40mg  daily. Can re-check lipids/LFTs at follow-up.   Disposition: Follow up in 3 month(s) with MD/APP   Signed, Demarlo Riojas 11-02-2003, PA-C  03/16/2021 3:21 PM    [facility]

## 2021-03-14 NOTE — Telephone Encounter (Signed)
Opened in error

## 2021-03-16 ENCOUNTER — Encounter: Payer: Self-pay | Admitting: Medical

## 2021-03-16 ENCOUNTER — Other Ambulatory Visit: Payer: Self-pay

## 2021-03-16 ENCOUNTER — Ambulatory Visit (INDEPENDENT_AMBULATORY_CARE_PROVIDER_SITE_OTHER): Payer: Medicare PPO | Admitting: Medical

## 2021-03-16 VITALS — BP 120/70 | HR 74 | Ht 66.0 in | Wt 209.5 lb

## 2021-03-16 DIAGNOSIS — I340 Nonrheumatic mitral (valve) insufficiency: Secondary | ICD-10-CM

## 2021-03-16 DIAGNOSIS — R000 Tachycardia, unspecified: Secondary | ICD-10-CM | POA: Diagnosis not present

## 2021-03-16 DIAGNOSIS — E782 Mixed hyperlipidemia: Secondary | ICD-10-CM

## 2021-03-16 DIAGNOSIS — I10 Essential (primary) hypertension: Secondary | ICD-10-CM

## 2021-03-16 DIAGNOSIS — I5022 Chronic systolic (congestive) heart failure: Secondary | ICD-10-CM | POA: Diagnosis not present

## 2021-03-16 DIAGNOSIS — I2510 Atherosclerotic heart disease of native coronary artery without angina pectoris: Secondary | ICD-10-CM | POA: Diagnosis not present

## 2021-03-16 DIAGNOSIS — I428 Other cardiomyopathies: Secondary | ICD-10-CM | POA: Diagnosis not present

## 2021-03-16 NOTE — Patient Instructions (Signed)
Medication Instructions:  Your physician recommends that you continue on your current medications as directed. Please refer to the Current Medication list given to you today.  *If you need a refill on your cardiac medications before your next appointment, please call your pharmacy*   Lab Work: None ordered   If you have labs (blood work) drawn today and your tests are completely normal, you will receive your results only by: MyChart Message (if you have MyChart) OR A paper copy in the mail If you have any lab test that is abnormal or we need to change your treatment, we will call you to review the results.   Testing/Procedures: Your physician has requested that you have an echocardiogram. Echocardiography is a painless test that uses sound waves to create images of your heart. It provides your doctor with information about the size and shape of your heart and how well your heart's chambers and valves are working. This procedure takes approximately one hour. There are no restrictions for this procedure.   Follow-Up: At [facility], you and your health needs are our priority.  As part of our continuing mission to provide you with exceptional heart care, we have created designated Provider Care Teams.  These Care Teams include your primary Cardiologist (physician) and Advanced Practice Providers (APPs -  Physician Assistants and Nurse Practitioners) who all work together to provide you with the care you need, when you need it.  We recommend signing up for the patient portal called "MyChart".  Sign up information is provided on this After Visit Summary.  MyChart is used to connect with patients for Virtual Visits (Telemedicine).  Patients are able to view lab/test results, encounter notes, upcoming appointments, etc.  Non-urgent messages can be sent to your provider as well.   To learn more about what you can do with MyChart, go to [email or web address].    Your next appointment:   3  month(s)  The format for your next appointment:   In Person  Provider:   You may see Yvonne Kendall, MD or one of the following Advanced Practice Providers on your designated Care Team:   Nicolasa Ducking, NP Eula Listen, PA-C Marisue Ivan, PA-C Cadence Fransico Michael, [place]   Other Instructions None

## 2021-04-19 ENCOUNTER — Telehealth: Payer: Self-pay

## 2021-04-19 NOTE — Telephone Encounter (Signed)
Patient notified that entresto medication enrollment will expire Dec 31st and patient asked to come to the HF clinic in the next couple of weeks at her convenience to fill out a new form for 2023 resubmission. Patient acknowledged and stated she will come to the clinic sometime next week to fill out the entresto form.  Patient raised a concern that she has not been able to get in touch with farxiga representative to receive her farxiga medication after being approved for patient assistance.  This clinic has tried reaching [employer] on the patient's behalf and has been unable to reach anyone either. Pt advised to keep trying to reach them as she is able, and this nurse will discuss lack of availability of farxiga with provider, Clarisa Kindred, NP. Suanne Marker, RN

## 2021-04-27 ENCOUNTER — Other Ambulatory Visit: Payer: Medicare PPO

## 2021-05-14 NOTE — Progress Notes (Signed)
Patient ID: Evelyn Dunn, female    DOB: [birth year], 71 y.o.   MRN: [id number]  Evelyn Dunn is a 70 y/o female with a history of HTN,  tobacco use and chronic heart failure.   Echo report from 11/26/20 reviewed and showed an EF of 20-25% along with moderate/ severe MR.   LHC/RHC done 12/15/20 showed: Moderate to severe single-vessel coronary artery disease with 20-30% proximal and tandem 50-70% mid LAD stenoses.  There is mild disease involving the LCx and RCA.  Severity of cardiomyopathy is out of proportion to degree of coronary artery disease suggesting primarily non-ischemic etiology. Moderate to severely elevated left heart filling pressure. Severely elevated right heart and pulmonary artery pressures. Moderately reduced Fick cardiac output/index.  Admitted 11/25/20 due to acute HF. Initially thought to be STEMI but this was dismissed. Cardiology consult obtained. Initially given IV lasix with transition to oral diuretics. Leg doppler negative for DVT. Discharged after 14 days.   She presents today for a follow-up visit with a chief complaint of minimal weight gain. She describes this as occurring over several week. She has no other symptoms and specifically denies any difficulty sleeping, dizziness, abdominal distention, palpitations, pedal edema, chest pain, shortness of breath, cough or fatigue.   She has been out of all her medications for ~ 3 weeks because she says that she can't afford the entresto or farxiga. She has been approved for patient assistance for both of those medications but she says that she can't get through for farxiga.   She also admits to being under quite a bit of stress with living with her brother who causes a lot of stress and yells/ cusses at her often.   Past Medical History:  Diagnosis Date   Chronic HFrEF (heart failure with reduced ejection fraction) ([place])    a. 11/2020 Echo: EF 20-25%, glob HK. Nl RV size/fxn, sev BAE. Mod-sev MR. Mild AI; b. 11/2020 Cath:  CO/CI 3.6/1.9 respectively.   CKD (chronic kidney disease), stage III ([place])    Hypertension    a. 11/2020 Renal Duplex: No high grade RAS.   LBBB (left bundle branch block)    Moderate to severe mitral regurgitation    NICM (nonischemic cardiomyopathy) ([place])    a. 11/2020 Echo: EF 20-25%, glob HK; b. 11/2020 Cath: nonobs dzs.   Nonobstructive CAD (coronary artery disease)    a. 11/2020 Cath: LM nl, LAD 25p, 65/34m, D2 30, LCX 2m, OM1 mod dzs, RCA mild diff dzs, RPDA 20-->Med rx.   Past Surgical History:  Procedure Laterality Date   RIGHT/LEFT HEART CATH AND CORONARY ANGIOGRAPHY N/A 12/05/2020   Procedure: RIGHT/LEFT HEART CATH AND CORONARY ANGIOGRAPHY;  Surgeon: Nelva Bush, MD;  Location: [place] [facility];  Service: Cardiovascular;  Laterality: N/A;   Family History  Problem Relation Age of Onset   Valvular heart disease Mother    Heart failure Father    Coronary artery disease Sister    Vascular Disease Brother    Social History   Tobacco Use   Smoking status: Every Day    Packs/day: 0.25    Types: Cigarettes   Smokeless tobacco: Never  Substance Use Topics   Alcohol use: Not Currently    Alcohol/week: 7.0 standard drinks    Types: 7 Shots of liquor per week   No Known Allergies  Prior to Admission medications   Medication Sig Start Date End Date Taking? Authorizing Provider  aspirin 81 MG chewable tablet Chew 1 tablet (81 mg total) by  mouth daily. Patient not taking: Reported on 05/15/2021 01/10/21   Creig Hines, NP  atorvastatin (LIPITOR) 40 MG tablet Take 1 tablet (40 mg total) by mouth daily. Patient not taking: Reported on 05/15/2021 01/10/21   Creig Hines, NP  dapagliflozin propanediol (FARXIGA) 10 MG TABS tablet Take 1 tablet (10 mg total) by mouth daily before breakfast. Patient not taking: Reported on 05/15/2021 01/31/21   Delma Freeze, FNP  metoprolol succinate (TOPROL-XL) 25 MG 24 hr tablet Take 1 tablet (25 mg total) by mouth  daily. Patient not taking: Reported on 05/15/2021 01/10/21   Creig Hines, NP  sacubitril-valsartan (ENTRESTO) 24-26 MG Take 1 tablet by mouth 2 (two) times daily. Patient not taking: Reported on 05/15/2021 12/15/20   Delma Freeze, FNP    Review of Systems  Constitutional:  Negative for appetite change and fatigue.  HENT:  Negative for congestion, postnasal drip and sore throat.   Eyes: Negative.   Respiratory:  Negative for cough, chest tightness and shortness of breath.   Cardiovascular:  Negative for chest pain, palpitations and leg swelling.  Gastrointestinal:  Negative for abdominal distention and abdominal pain.  Endocrine: Negative.   Genitourinary: Negative.   Musculoskeletal:  Negative for arthralgias, back pain and neck pain.  Skin: Negative.   Allergic/Immunologic: Negative.   Neurological:  Negative for dizziness and light-headedness.  Hematological:  Negative for adenopathy. Does not bruise/bleed easily.  Psychiatric/Behavioral:  Negative for dysphoric mood and sleep disturbance (sleeping on 2 pillows). The patient is nervous/anxious.    Vitals:   05/15/21 1312  BP: (!) 165/86  Pulse: 96  Resp: 18  SpO2: 99%  Weight: 208 lb 8 oz (94.6 kg)  Height: 5\' 6"  (1.676 m)   Wt Readings from Last 3 Encounters:  05/15/21 208 lb 8 oz (94.6 kg)  03/16/21 209 lb 8 oz (95 kg)  03/13/21 206 lb (93.4 kg)   Lab Results  Component Value Date   CREATININE 1.43 (H) 03/13/2021   CREATININE 1.27 (H) 01/10/2021   CREATININE 1.32 (H) 12/08/2020   Physical Exam Vitals and nursing note reviewed.  Constitutional:      Appearance: Normal appearance.  HENT:     Head: Normocephalic and atraumatic.  Cardiovascular:     Rate and Rhythm: Normal rate and regular rhythm.     Pulses: Normal pulses.     Heart sounds: No murmur heard.   No gallop.  Pulmonary:     Effort: Pulmonary effort is normal. No respiratory distress.     Breath sounds: No wheezing or rales.   Abdominal:     General: There is no distension.     Palpations: Abdomen is soft.  Musculoskeletal:        General: No tenderness.     Cervical back: Normal range of motion and neck supple.     Right lower leg: No edema.     Left lower leg: No edema.  Skin:    General: Skin is warm and dry.  Neurological:     General: No focal deficit present.     Mental Status: She is alert and oriented to person, place, and time.  Psychiatric:        Mood and Affect: Mood normal.        Behavior: Behavior normal.        Thought Content: Thought content normal.    Assessment & Plan:  1: Chronic heart failure with reduced ejection fraction- - NYHA class I - euvolemic today -  weighs intermittently; instructed to weigh daily and call for an overnight weight gain of > 2 pounds or a weekly weight gain of > 5 pounds - weight up 2 pounds from last visit here 2 months ago - not adding salt to her food and is trying to closely read food labels for sodium content - has been out of all medications for ~ 3 weeks as she says that she can't afford the entresto/ farxiga; called [employer] and got her 3 month shipment expedited and she should receive it tomorrow; 1 sample provided of entresto 24/26mg ; reminded that she needed to call [employer] about 2 weeks before she runs out of the medication - attempted to reach [place] patient assistance as she's been approved but patient says that she's never been able to reach them; after NT was on hold for 25 minutes, she hung up; asked patient to continue to try at home but if she can't get through, may try jardiance - explained that her other medications are generic and should not be very expensive - saw cardiology Kathlen Mody) 03/16/21 - has echo scheduled for 06/01/21 - BNP 12/07/20 was 251.0  2: HTN- - BP elevated (165/86) but she's been without all her medications for ~ 3 weeks - sees PCP at [place] 03/13/21 reviewed and showed sodium 138, potassium 3.9,  creatinine 1.43 and GFR 39  3: Tobacco use- - complete cessation discussed for 3 minutes - she continues to smoke 3 cigarettes daily  4: Anxiety- - patient admits to being under considerable stress with living with her brother - she says that he's very demanding and will yell or cuss often at her - she says that she moved in with him to help him take care of himself but she feels like she's going to need to find a new place to live - emotional support given   Patient did not bring her medications nor a list. Each medication was verbally reviewed with the patient and she was encouraged to bring the bottles to every visit to confirm accuracy of list.   Return in 2 weeks or sooner for any questions/problems before then.

## 2021-05-15 ENCOUNTER — Ambulatory Visit: Payer: Medicare PPO | Attending: Family | Admitting: Family

## 2021-05-15 ENCOUNTER — Other Ambulatory Visit: Payer: Self-pay

## 2021-05-15 ENCOUNTER — Encounter: Payer: Self-pay | Admitting: Family

## 2021-05-15 VITALS — BP 165/86 | HR 96 | Resp 18 | Ht 66.0 in | Wt 208.5 lb

## 2021-05-15 DIAGNOSIS — Z720 Tobacco use: Secondary | ICD-10-CM

## 2021-05-15 DIAGNOSIS — F419 Anxiety disorder, unspecified: Secondary | ICD-10-CM | POA: Diagnosis not present

## 2021-05-15 DIAGNOSIS — I5022 Chronic systolic (congestive) heart failure: Secondary | ICD-10-CM | POA: Insufficient documentation

## 2021-05-15 DIAGNOSIS — I110 Hypertensive heart disease with heart failure: Secondary | ICD-10-CM | POA: Insufficient documentation

## 2021-05-15 DIAGNOSIS — F17210 Nicotine dependence, cigarettes, uncomplicated: Secondary | ICD-10-CM | POA: Diagnosis not present

## 2021-05-15 DIAGNOSIS — I10 Essential (primary) hypertension: Secondary | ICD-10-CM | POA: Diagnosis not present

## 2021-05-15 NOTE — Patient Instructions (Addendum)
Continue weighing daily and call for an overnight weight gain of > 2 pounds or a weekly weight gain of >5 pounds.    Resume your entresto as 1 tablet in the morning and 1 tablet in the evening.

## 2021-06-01 ENCOUNTER — Ambulatory Visit (INDEPENDENT_AMBULATORY_CARE_PROVIDER_SITE_OTHER): Payer: Medicare PPO

## 2021-06-01 ENCOUNTER — Encounter: Payer: Self-pay | Admitting: Family

## 2021-06-01 ENCOUNTER — Ambulatory Visit: Payer: Medicare PPO | Attending: Family | Admitting: Family

## 2021-06-01 ENCOUNTER — Other Ambulatory Visit: Payer: Self-pay

## 2021-06-01 VITALS — BP 126/73 | HR 81 | Resp 18 | Ht 66.0 in | Wt 207.1 lb

## 2021-06-01 DIAGNOSIS — Z720 Tobacco use: Secondary | ICD-10-CM

## 2021-06-01 DIAGNOSIS — F419 Anxiety disorder, unspecified: Secondary | ICD-10-CM

## 2021-06-01 DIAGNOSIS — I429 Cardiomyopathy, unspecified: Secondary | ICD-10-CM | POA: Insufficient documentation

## 2021-06-01 DIAGNOSIS — Z79899 Other long term (current) drug therapy: Secondary | ICD-10-CM | POA: Insufficient documentation

## 2021-06-01 DIAGNOSIS — N183 Chronic kidney disease, stage 3 unspecified: Secondary | ICD-10-CM | POA: Diagnosis not present

## 2021-06-01 DIAGNOSIS — F17210 Nicotine dependence, cigarettes, uncomplicated: Secondary | ICD-10-CM | POA: Diagnosis not present

## 2021-06-01 DIAGNOSIS — I428 Other cardiomyopathies: Secondary | ICD-10-CM

## 2021-06-01 DIAGNOSIS — I2510 Atherosclerotic heart disease of native coronary artery without angina pectoris: Secondary | ICD-10-CM | POA: Insufficient documentation

## 2021-06-01 DIAGNOSIS — I130 Hypertensive heart and chronic kidney disease with heart failure and stage 1 through stage 4 chronic kidney disease, or unspecified chronic kidney disease: Secondary | ICD-10-CM | POA: Insufficient documentation

## 2021-06-01 DIAGNOSIS — I10 Essential (primary) hypertension: Secondary | ICD-10-CM | POA: Diagnosis not present

## 2021-06-01 DIAGNOSIS — I5022 Chronic systolic (congestive) heart failure: Secondary | ICD-10-CM

## 2021-06-01 LAB — ECHOCARDIOGRAM COMPLETE
AR max vel: 2.2 cm2
AV Area VTI: 2.35 cm2
AV Area mean vel: 2.24 cm2
AV Mean grad: 5 mmHg
AV Peak grad: 8.3 mmHg
AV Vena cont: 0.4 cm
Ao pk vel: 1.44 m/s
Area-P 1/2: 3.89 cm2
Calc EF: 30 %
P 1/2 time: 663 msec
S' Lateral: 4.6 cm
Single Plane A2C EF: 34.4 %
Single Plane A4C EF: 30.2 %

## 2021-06-01 NOTE — Patient Instructions (Signed)
Continue weighing daily and call for an overnight weight gain of 3 pounds or more or a weekly weight gain of more than 5 pounds.  °

## 2021-06-01 NOTE — Progress Notes (Signed)
Patient ID: Evelyn Dunn, female    DOB: 04/25/1951, 70 y.o.   MRN: [id number]  Evelyn Dunn is a 70 y/o female with a history of HTN,  tobacco use and chronic heart failure.   Echo report from 11/26/20 reviewed and showed an EF of 20-25% along with moderate/ severe MR.   LHC/RHC done 12/15/20 showed: Moderate to severe single-vessel coronary artery disease with 20-30% proximal and tandem 50-70% mid LAD stenoses.  There is mild disease involving the LCx and RCA.  Severity of cardiomyopathy is out of proportion to degree of coronary artery disease suggesting primarily non-ischemic etiology. Moderate to severely elevated left heart filling pressure. Severely elevated right heart and pulmonary artery pressures. Moderately reduced Fick cardiac output/index.  Admitted 11/25/20 due to acute HF. Initially thought to be STEMI but this was dismissed. Cardiology consult obtained. Initially given IV lasix with transition to oral diuretics. Leg doppler negative for DVT. Discharged after 14 days.   She presents today with a chief complaint of a follow-up visit. She says that she has no complaints and specifically denies any difficulty sleeping, dizziness, abdominal distention, palpitations, pedal edema, chest pain, shortness of breath, cough, fatigue or weight gain.   Received her entresto from [employer] patient assistance but has still been unable to reach farxiga. Had her echo done earlier today.   Reports the situation with her brother is much improved as well.   Past Medical History:  Diagnosis Date   Chronic HFrEF (heart failure with reduced ejection fraction) (HCC)    a. 11/2020 Echo: EF 20-25%, glob HK. Nl RV size/fxn, sev BAE. Mod-sev MR. Mild AI; b. 11/2020 Cath: CO/CI 3.6/1.9 respectively.   CKD (chronic kidney disease), stage III (HCC)    Hypertension    a. 11/2020 Renal Duplex: No high grade RAS.   LBBB (left bundle branch block)    Moderate to severe mitral regurgitation    NICM (nonischemic  cardiomyopathy) (HCC)    a. 11/2020 Echo: EF 20-25%, glob HK; b. 11/2020 Cath: nonobs dzs.   Nonobstructive CAD (coronary artery disease)    a. 11/2020 Cath: LM nl, LAD 25p, 65/69m, D2 30, LCX 3m, OM1 mod dzs, RCA mild diff dzs, RPDA 20-->Med rx.   Past Surgical History:  Procedure Laterality Date   RIGHT/LEFT HEART CATH AND CORONARY ANGIOGRAPHY N/A 12/05/2020   Procedure: RIGHT/LEFT HEART CATH AND CORONARY ANGIOGRAPHY;  Surgeon: Yvonne Kendall, MD;  Location: [facility] INVASIVE CV [facility];  Service: Cardiovascular;  Laterality: N/A;   Family History  Problem Relation Age of Onset   Valvular heart disease Mother    Heart failure Father    Coronary artery disease Sister    Vascular Disease Brother    Social History   Tobacco Use   Smoking status: Every Day    Packs/day: 0.25    Types: Cigarettes   Smokeless tobacco: Never  Substance Use Topics   Alcohol use: Not Currently    Alcohol/week: 7.0 standard drinks    Types: 7 Shots of liquor per week   No Known Allergies  Prior to Admission medications   Medication Sig Start Date End Date Taking? Authorizing Provider  aspirin 81 MG chewable tablet Chew 1 tablet (81 mg total) by mouth daily. 01/10/21  Yes Creig Hines, NP  atorvastatin (LIPITOR) 40 MG tablet Take 1 tablet (40 mg total) by mouth daily. 01/10/21  Yes Creig Hines, NP  metoprolol succinate (TOPROL-XL) 25 MG 24 hr tablet Take 1 tablet (25 mg total) by mouth daily.  01/10/21  Yes Theora Gianotti, NP  sacubitril-valsartan (ENTRESTO) 24-26 MG Take 1 tablet by mouth 2 (two) times daily. 12/15/20  Yes Darylene Price A, FNP  dapagliflozin propanediol (FARXIGA) 10 MG TABS tablet Take 1 tablet (10 mg total) by mouth daily before breakfast. Patient not taking: Reported on 06/01/2021 01/31/21   Alisa Graff, FNP   Review of Systems  Constitutional:  Negative for appetite change and fatigue.  HENT:  Negative for congestion, postnasal drip and sore throat.    Eyes: Negative.   Respiratory:  Negative for cough, chest tightness and shortness of breath.   Cardiovascular:  Negative for chest pain, palpitations and leg swelling.  Gastrointestinal:  Negative for abdominal distention and abdominal pain.  Endocrine: Negative.   Genitourinary: Negative.   Musculoskeletal:  Negative for arthralgias, back pain and neck pain.  Skin: Negative.   Allergic/Immunologic: Negative.   Neurological:  Negative for dizziness and light-headedness.  Hematological:  Negative for adenopathy. Does not bruise/bleed easily.  Psychiatric/Behavioral:  Negative for dysphoric mood and sleep disturbance (sleeping on 2 pillows). The patient is not nervous/anxious.    Vitals:   06/01/21 1549  BP: 126/73  Pulse: 81  Resp: 18  SpO2: 97%  Weight: 207 lb 2 oz (94 kg)  Height: 5\' 6"  (1.676 m)   Wt Readings from Last 3 Encounters:  06/01/21 207 lb 2 oz (94 kg)  05/15/21 208 lb 8 oz (94.6 kg)  03/16/21 209 lb 8 oz (95 kg)   Lab Results  Component Value Date   CREATININE 1.43 (H) 03/13/2021   CREATININE 1.27 (H) 01/10/2021   CREATININE 1.32 (H) 12/08/2020   Physical Exam Vitals and nursing note reviewed.  Constitutional:      Appearance: Normal appearance.  HENT:     Head: Normocephalic and atraumatic.  Cardiovascular:     Rate and Rhythm: Normal rate and regular rhythm.     Pulses: Normal pulses.     Heart sounds: No murmur heard.   No gallop.  Pulmonary:     Effort: Pulmonary effort is normal. No respiratory distress.     Breath sounds: No wheezing or rales.  Abdominal:     General: There is no distension.     Palpations: Abdomen is soft.  Musculoskeletal:        General: No tenderness.     Cervical back: Normal range of motion and neck supple.     Right lower leg: No edema.     Left lower leg: No edema.  Skin:    General: Skin is warm and dry.  Neurological:     General: No focal deficit present.     Mental Status: She is alert and oriented to person,  place, and time.  Psychiatric:        Mood and Affect: Mood normal.        Behavior: Behavior normal.        Thought Content: Thought content normal.    Assessment & Plan:  1: Chronic heart failure with reduced ejection fraction- - NYHA class I - euvolemic today - weighing daily; reminded to call for an overnight weight gain of > 2 pounds or a weekly weight gain of > 5 pounds - weight stable from last visit here 2 weeks ago - not adding salt to her food and is trying to closely read food labels for sodium content - has received her entresto in the mail from [employer] patient assistance; continues to be unable to reach farxiga companty -  saw cardiology Kathlen Mody) 03/16/21 - had echo earlier today; depending on results may need to start jardiance in place of farxiga - currently on GDMT of metoprolol and entresto - BNP 12/07/20 was 251.0  2: HTN- - BP looks good (126/73) - sees PCP at [facility] - BMP 03/13/21 reviewed and showed sodium 138, potassium 3.9, creatinine 1.43 and GFR 39  3: Tobacco use- - complete cessation discussed for 3 minutes - she continues to smoke 3 cigarettes daily  4: Anxiety- - patient reports that this is much better - said that their sister came and talked with their brother so things are much better at home   Patient did not bring her medications nor a list. Each medication was verbally reviewed with the patient and she was encouraged to bring the bottles to every visit to confirm accuracy of list.   Return in 1 month or sooner for any questions/problems before then.

## 2021-06-02 ENCOUNTER — Telehealth: Payer: Self-pay | Admitting: Medical

## 2021-06-02 DIAGNOSIS — I428 Other cardiomyopathies: Secondary | ICD-10-CM

## 2021-06-02 DIAGNOSIS — I519 Heart disease, unspecified: Secondary | ICD-10-CM

## 2021-06-02 DIAGNOSIS — I5022 Chronic systolic (congestive) heart failure: Secondary | ICD-10-CM

## 2021-06-02 NOTE — Telephone Encounter (Signed)
I spoke with the patient regarding her echo results and Cadence Fransico Michael, PA's recommendations for a consultation with EP for consideration of an ICD. The patient voices understanding of her results and is agreeable to an appointment with EP>> offered an appointment on 06/15/21 at 10:00 am with Dr. Graciela Husbands. Per the patient, she cannot do morning appointments due to her work schedule.  She would like to see Dr. Lalla Brothers on 07/26/20 at 1:20 pm- appt scheduled. She will see Dr. Okey Dupre on 06/28/21 as scheduled.

## 2021-06-02 NOTE — Telephone Encounter (Signed)
Cadence David Stall, PA-C  06/02/2021  9:48 AM EST     Echo showed LVEF 25-30% with moderately leaky valve. Pump function is still low. Needs referral to EP for ICD consideration.

## 2021-06-26 ENCOUNTER — Telehealth: Payer: Self-pay | Admitting: Family

## 2021-06-26 NOTE — Telephone Encounter (Signed)
Called patient with a reminder that I need proof of income for her [employer] Patient assistance Application as soon as possible.   Nell Schrack, NT

## 2021-06-28 ENCOUNTER — Ambulatory Visit: Payer: Medicare PPO | Admitting: Internal Medicine

## 2021-07-01 NOTE — Progress Notes (Deleted)
Patient ID: Evelyn Dunn, female    DOB: March 06, 1951, 71 y.o.   MRN: [id number]  Ms Hahne is a 71 y/o female with a history of HTN,  tobacco use and chronic heart failure.   Echo report from 06/01/21 reviewed and showed an EF of 25-30% along with mild/moderate MR. Echo report from 11/26/20 reviewed and showed an EF of 20-25% along with moderate/ severe MR.   LHC/RHC done 12/15/20 showed: Moderate to severe single-vessel coronary artery disease with 20-30% proximal and tandem 50-70% mid LAD stenoses.  There is mild disease involving the LCx and RCA.  Severity of cardiomyopathy is out of proportion to degree of coronary artery disease suggesting primarily non-ischemic etiology. Moderate to severely elevated left heart filling pressure. Severely elevated right heart and pulmonary artery pressures. Moderately reduced Fick cardiac output/index.  Has not been admitted or been in the ED in the last 6 months.   She presents today with a chief complaint of a follow-up visit.   Past Medical History:  Diagnosis Date   Chronic HFrEF (heart failure with reduced ejection fraction) ([place])    a. 11/2020 Echo: EF 20-25%, glob HK. Nl RV size/fxn, sev BAE. Mod-sev MR. Mild AI; b. 11/2020 Cath: CO/CI 3.6/1.9 respectively.   CKD (chronic kidney disease), stage III ([place])    Hypertension    a. 11/2020 Renal Duplex: No high grade RAS.   LBBB (left bundle branch block)    Moderate to severe mitral regurgitation    NICM (nonischemic cardiomyopathy) ([place])    a. 11/2020 Echo: EF 20-25%, glob HK; b. 11/2020 Cath: nonobs dzs.   Nonobstructive CAD (coronary artery disease)    a. 11/2020 Cath: LM nl, LAD 25p, 65/78m, D2 30, LCX 66m, OM1 mod dzs, RCA mild diff dzs, RPDA 20-->Med rx.   Past Surgical History:  Procedure Laterality Date   RIGHT/LEFT HEART CATH AND CORONARY ANGIOGRAPHY N/A 12/05/2020   Procedure: RIGHT/LEFT HEART CATH AND CORONARY ANGIOGRAPHY;  Surgeon: Nelva Bush, MD;  Location: [place] [facility];   Service: Cardiovascular;  Laterality: N/A;   Family History  Problem Relation Age of Onset   Valvular heart disease Mother    Heart failure Father    Coronary artery disease Sister    Vascular Disease Brother    Social History   Tobacco Use   Smoking status: Every Day    Packs/day: 0.25    Types: Cigarettes   Smokeless tobacco: Never  Substance Use Topics   Alcohol use: Not Currently    Alcohol/week: 7.0 standard drinks    Types: 7 Shots of liquor per week   No Known Allergies   Review of Systems  Constitutional:  Negative for appetite change and fatigue.  HENT:  Negative for congestion, postnasal drip and sore throat.   Eyes: Negative.   Respiratory:  Negative for cough, chest tightness and shortness of breath.   Cardiovascular:  Negative for chest pain, palpitations and leg swelling.  Gastrointestinal:  Negative for abdominal distention and abdominal pain.  Endocrine: Negative.   Genitourinary: Negative.   Musculoskeletal:  Negative for arthralgias, back pain and neck pain.  Skin: Negative.   Allergic/Immunologic: Negative.   Neurological:  Negative for dizziness and light-headedness.  Hematological:  Negative for adenopathy. Does not bruise/bleed easily.  Psychiatric/Behavioral:  Negative for dysphoric mood and sleep disturbance (sleeping on 2 pillows). The patient is not nervous/anxious.      Physical Exam Vitals and nursing note reviewed.  Constitutional:      Appearance: Normal appearance.  HENT:     Head: Normocephalic and atraumatic.  Cardiovascular:     Rate and Rhythm: Normal rate and regular rhythm.     Pulses: Normal pulses.     Heart sounds: No murmur heard.   No gallop.  Pulmonary:     Effort: Pulmonary effort is normal. No respiratory distress.     Breath sounds: No wheezing or rales.  Abdominal:     General: There is no distension.     Palpations: Abdomen is soft.  Musculoskeletal:        General: No tenderness.     Cervical back: Normal  range of motion and neck supple.     Right lower leg: No edema.     Left lower leg: No edema.  Skin:    General: Skin is warm and dry.  Neurological:     General: No focal deficit present.     Mental Status: She is alert and oriented to person, place, and time.  Psychiatric:        Mood and Affect: Mood normal.        Behavior: Behavior normal.        Thought Content: Thought content normal.    Assessment & Plan:  1: Chronic heart failure with reduced ejection fraction- - NYHA class I - euvolemic today - weighing daily; reminded to call for an overnight weight gain of > 2 pounds or a weekly weight gain of > 5 pounds - weight 207.2 from last visit here 1 month ago - not adding salt to her food and is trying to closely read food labels for sodium content - has received her entresto in the mail from [employer] patient assistance; continues to be unable to reach farxiga companty - saw cardiology Kathlen Mody) 03/16/21 - currently on GDMT of metoprolol and entresto - BNP 12/07/20 was 251.0  2: HTN- - BP  - sees PCP at [facility] - BMP 03/13/21 reviewed and showed sodium 138, potassium 3.9, creatinine 1.43 and GFR 39  3: Tobacco use- - complete cessation discussed for 3 minutes - she continues to smoke 3 cigarettes daily  4: Anxiety- - patient reports that this is much better - said that their sister came and talked with their brother so things are much better at home   Patient did not bring her medications nor a list. Each medication was verbally reviewed with the patient and she was encouraged to bring the bottles to every visit to confirm accuracy of list.

## 2021-07-03 ENCOUNTER — Ambulatory Visit: Payer: Medicare PPO | Admitting: Family

## 2021-07-03 ENCOUNTER — Telehealth: Payer: Self-pay | Admitting: Family

## 2021-07-03 NOTE — Telephone Encounter (Signed)
Patient did not show for her [facility] appointment on 07/03/21. Will attempt to reschedule.   °

## 2021-07-25 ENCOUNTER — Telehealth: Payer: Self-pay | Admitting: Family

## 2021-07-25 NOTE — Telephone Encounter (Signed)
Called and reminded patient to bring proof of income for [employer] patient assistance for [facility]. Patient stated she has a different appointment up here tomorrow and will bring proof to [place].  Evelyn Dunn, NT

## 2021-07-26 ENCOUNTER — Encounter: Payer: Self-pay | Admitting: *Deleted

## 2021-07-26 ENCOUNTER — Encounter: Payer: Self-pay | Admitting: Cardiology

## 2021-07-26 ENCOUNTER — Ambulatory Visit (INDEPENDENT_AMBULATORY_CARE_PROVIDER_SITE_OTHER): Payer: Medicare PPO | Admitting: Cardiology

## 2021-07-26 ENCOUNTER — Other Ambulatory Visit: Payer: Self-pay

## 2021-07-26 VITALS — BP 134/84 | HR 91 | Ht 66.0 in | Wt 203.0 lb

## 2021-07-26 DIAGNOSIS — I5022 Chronic systolic (congestive) heart failure: Secondary | ICD-10-CM | POA: Diagnosis not present

## 2021-07-26 DIAGNOSIS — I447 Left bundle-branch block, unspecified: Secondary | ICD-10-CM | POA: Diagnosis not present

## 2021-07-26 DIAGNOSIS — I428 Other cardiomyopathies: Secondary | ICD-10-CM | POA: Diagnosis not present

## 2021-07-26 DIAGNOSIS — I10 Essential (primary) hypertension: Secondary | ICD-10-CM

## 2021-07-26 NOTE — Patient Instructions (Addendum)
Medications: Your physician recommends that you continue on your current medications as directed. Please refer to the Current Medication list given to you today. *If you need a refill on your cardiac medications before your next appointment, please call your pharmacy*  Lab Work: None. If you have labs (blood work) drawn today and your tests are completely normal, you will receive your results only by: MyChart Message (if you have MyChart) OR A paper copy in the mail If you have any lab test that is abnormal or we need to change your treatment, we will call you to review the results.  Testing/Procedures: Your physician has recommended that you have a defibrillator inserted. An implantable cardioverter defibrillator (ICD) is a small device that is placed in your chest or, in rare cases, your abdomen. This device uses electrical pulses or shocks to help control life-threatening, irregular heartbeats that could lead the heart to suddenly stop beating (sudden cardiac arrest). Leads are attached to the ICD that goes into your heart. This is done in the hospital and usually requires an overnight stay. Please see the instruction sheet given to you today for more information.   Follow-Up: At [facility], you and your health needs are our priority.  As part of our continuing mission to provide you with exceptional heart care, we have created designated Provider Care Teams.  These Care Teams include your primary Cardiologist (physician) and Advanced Practice Providers (APPs -  Physician Assistants and Nurse Practitioners) who all work together to provide you with the care you need, when you need it.  Your physician wants you to follow-up in: (May 1) 10-14 days post op with the Device Clinic. 91 days post op with Dr. Lalla Brothers.   We recommend signing up for the patient portal called "MyChart".  Sign up information is provided on this After Visit Summary.  MyChart is used to connect with patients for Virtual  Visits (Telemedicine).  Patients are able to view lab/test results, encounter notes, upcoming appointments, etc.  Non-urgent messages can be sent to your provider as well.   To learn more about what you can do with MyChart, go to [email or web address].    Any Other Special Instructions Will Be Listed Below (If Applicable).   Cardioverter Defibrillator Implantation An implantable cardioverter defibrillator (ICD) is a device that identifies and corrects abnormal heart rhythms. Cardioverter defibrillator implantation is a surgery to place an ICD under the skin in the chest or abdomen. An ICD has a battery, a small computer (pulse generator), and wires (leads) that go into the heart. The ICD detects and corrects two types of dangerous irregular heart rhythms (arrhythmias): A rapid heart rhythm in the lower chambers of the heart (ventricles). This is called ventricular tachycardia. The ventricles contracting in an uncoordinated way. This is called ventricular fibrillation. There are different types of ICDs, and the electrical signals from the ICD can be programmed differently based on the condition being treated. The electrical signals from the ICD can be low-energy pulses, high-energy shocks, or a combination of the two. The low-energy pulses are generally used to restore the heartbeat to normal when it is either too slow (bradycardia) or too fast. These pulses are painless. The high-energy shocks are used to treat abnormal rhythms such as ventricular tachycardia or ventricular fibrillation. This shock may feel like a strong jolt in the chest. Your health care provider may recommend an ICD if you have: Had a ventricular arrhythmia in the past. A damaged heart because of a disease or heart  condition. A weakened heart muscle from a heart attack or cardiac arrest. A congenital heart defect. Long QT syndrome, which is a disorder of the heart's electrical system. Brugada syndrome, which is a condition that  causes a disruption of the heart's normal rhythm. Tell a health care provider about: Any allergies you have. All medicines you are taking, including vitamins, herbs, eye drops, creams, and over-the-counter medicines. Any problems you or family members have had with anesthetic medicines. Any blood disorders you have. Any surgeries you have had. Any medical conditions you have. Whether you are pregnant or may be pregnant. What are the risks? Generally, this is a safe procedure. However, problems may occur, including: Infection. Bleeding. Allergic reactions to medicines used during the procedure. Blood clots. Swelling or bruising. Damage to nearby structures or organs, such as nerves, lungs, blood vessels, or the heart where the ICD leads or pulse generator is implanted. What happens before the procedure? Staying hydrated Follow instructions from your health care provider about hydration, which may include: Up to 2 hours before the procedure - you may continue to drink clear liquids, such as water, clear fruit juice, black coffee, and plain tea.  Eating and drinking restrictions Follow instructions from your health care provider about eating and drinking, which may include: 8 hours before the procedure - stop eating heavy meals or foods, such as meat, fried foods, or fatty foods. 6 hours before the procedure - stop eating light meals or foods, such as toast or cereal. 6 hours before the procedure - stop drinking milk or drinks that contain milk. 2 hours before the procedure - stop drinking clear liquids. Medicines Ask your health care provider about: Changing or stopping your regular medicines. This is especially important if you are taking diabetes medicines or blood thinners. Taking medicines such as aspirin and ibuprofen. These medicines can thin your blood. Do not take these medicines unless your health care provider tells you to take them. Taking over-the-counter medicines,  vitamins, herbs, and supplements. Tests You may have an exam or testing. These may include: Blood tests. A test to check the electrical signals in your heart (electrocardiogram, ECG). Imaging tests, such as a chest X-ray. Echocardiogram. This is an ultrasound of your heart to evaluate your heart structures and function. An event monitor or Holter monitor to wear at home. General instructions Do not use any products that contain nicotine or tobacco for at least 4 weeks before the procedure. These products include cigarettes, chewing tobacco, and vaping devices, such as e-cigarettes. If you need help quitting, ask your health care provider. Ask your health care provider: How your procedure site will be marked. What steps will be taken to help prevent infection. These may include: Removing hair at the surgery site. Washing skin with a germ-killing soap. Taking antibiotic medicine. You may be asked to shower with a germ-killing soap. Plan to have a responsible adult take you home from the hospital or clinic. What happens during the procedure?  Small monitors will be put on your body. They will be used to check your heart rate, blood pressure, and oxygen level. A pair of sticky pads (defibrillator pads) may be placed on your back and chest. These pads are able to pace your heart as needed during the procedure. An IV will be inserted into one of your veins. You will be given one or more of the following: A medicine to help you relax (sedative). A medicine to numb the area (local anesthetic). A medicine to make  you fall asleep(general anesthetic). A small incision will be made to create a deep pocket under the skin of your chest or abdomen. Leads will be guided through a blood vessel into your heart and attached to your heart muscles. Depending on the ICD, the leads may go into one ventricle, or they may go into both ventricles and into an upper chamber of the heart. An X-ray machine  (fluoroscope) will be used to help guide the leads. The other end of the leads will be attached to the pulse generator. The pulse generator will be placed into the pocket under the skin. The ICD will be tested, and your health care provider will program the ICD for the condition being treated. The incision will be closed with stitches (sutures), skin glue, adhesive strips, or staples. A bandage (dressing) will be placed over the incision. The procedure may vary among health care providers and hospitals. What happens after the procedure? Your blood pressure, heart rate, breathing rate, and blood oxygen level will be monitored until you leave the hospital or clinic. Your health care provider will also monitor your ICD to make sure it is working properly. A chest X-ray will be taken to check that the ICD is in the right place. Do not raise the arm on the side of your procedure higher than your shoulder for as long as told by your health care provider. This is usually at least 6 weeks. You may be given an identification card explaining that you have an ICD. You will be given a remote home monitoring device to use with your ICD to allow your device to communicate with your clinic. Summary An implantable cardioverter defibrillator (ICD) is a device that identifies and corrects abnormal heart rhythms. Cardioverter defibrillator implantation is a surgery to place an ICD under the skin in the chest or abdomen. An ICD consists of a battery, a small computer (pulse generator), and wires (leads) that go into the heart. During the procedure, the ICD will be tested, and your health care provider will program the ICD for the condition being treated. After the procedure, a chest X-ray will be taken to check that the ICD is in the right place. This information is not intended to replace advice given to you by your health care provider. Make sure you discuss any questions you have with your health care  provider. Document Revised: 12/02/2019 Document Reviewed: 12/02/2019 Elsevier Patient Education  2022 [employer].

## 2021-07-26 NOTE — Progress Notes (Signed)
Electrophysiology Office Note:    Date:  07/26/2021   ID:  Evelyn Dunn, DOB 1951-06-09, MRN [id number]  PCP:  Merryl Hacker, No  [facility] Cardiologist:  Nelva Bush, MD  [facility] Electrophysiologist:  Vickie Epley, MD   Referring MD: Antony Madura, PA-C   Chief Complaint: Chronic systolic heart failure  History of Present Illness:    Evelyn Dunn is a 71 y.o. female who presents for an evaluation of chronic systolic heart failure at the request of Eulis Foster, PA-C. Their medical history includes nonischemic cardiomyopathy, CKD 3, hypertension, moderate-severe MR, left bundle branch block and tobacco abuse.  The patient Lycelle cadence on March 16, 2021.  The patient is also followed by Darylene Price in the heart failure clinic in [place].  The patient has been hospitalized in the last year with heart failure.  She has been taking good medications and is currently on [place], Toprol, Farxiga.  She tells me she is fairly active but still gets dyspneic with significant exertion.  She is referred to discuss ICD implant.     Past Medical History:  Diagnosis Date   Chronic HFrEF (heart failure with reduced ejection fraction) ([place])    a. 11/2020 Echo: EF 20-25%, glob HK. Nl RV size/fxn, sev BAE. Mod-sev MR. Mild AI; b. 11/2020 Cath: CO/CI 3.6/1.9 respectively.   CKD (chronic kidney disease), stage III ([place])    Hypertension    a. 11/2020 Renal Duplex: No high grade RAS.   LBBB (left bundle branch block)    Moderate to severe mitral regurgitation    NICM (nonischemic cardiomyopathy) ([place])    a. 11/2020 Echo: EF 20-25%, glob HK; b. 11/2020 Cath: nonobs dzs.   Nonobstructive CAD (coronary artery disease)    a. 11/2020 Cath: LM nl, LAD 25p, 65/46m, D2 30, LCX 109m, OM1 mod dzs, RCA mild diff dzs, RPDA 20-->Med rx.    Past Surgical History:  Procedure Laterality Date   RIGHT/LEFT HEART CATH AND CORONARY ANGIOGRAPHY N/A 12/05/2020   Procedure: RIGHT/LEFT HEART CATH AND  CORONARY ANGIOGRAPHY;  Surgeon: Nelva Bush, MD;  Location: [facility];  Service: Cardiovascular;  Laterality: N/A;    Current Medications: Current Meds  Medication Sig   aspirin 81 MG chewable tablet Chew 1 tablet (81 mg total) by mouth daily.   atorvastatin (LIPITOR) 40 MG tablet Take 1 tablet (40 mg total) by mouth daily.   dapagliflozin propanediol (FARXIGA) 10 MG TABS tablet Take 1 tablet (10 mg total) by mouth daily before breakfast.   metoprolol succinate (TOPROL-XL) 25 MG 24 hr tablet Take 1 tablet (25 mg total) by mouth daily.   sacubitril-valsartan (ENTRESTO) 24-26 MG Take 1 tablet by mouth 2 (two) times daily.     Allergies:   Patient has no known allergies.   Social History   Socioeconomic History   Marital status: Married    Spouse name: Not on file   Number of children: Not on file   Years of education: Not on file   Highest education level: Not on file  Occupational History   Not on file  Tobacco Use   Smoking status: Every Day    Packs/day: 0.25    Types: Cigarettes   Smokeless tobacco: Never  Substance and Sexual Activity   Alcohol use: Not Currently    Alcohol/week: 7.0 standard drinks    Types: 7 Shots of liquor per week   Drug use: Never   Sexual activity: Not on file  Other Topics Concern  Not on file  Social History Narrative   Not on file   Social Determinants of Health   Financial Resource Strain: Not on file  Food Insecurity: Not on file  Transportation Needs: Not on file  Physical Activity: Not on file  Stress: Not on file  Social Connections: Not on file     Family History: The patient's family history includes Coronary artery disease in her sister; Heart failure in her father; Valvular heart disease in her mother; Vascular Disease in her brother.  ROS:   Please see the history of present illness.    All other systems reviewed and are negative.  EKGs/Labs/Other Studies Reviewed:    The following studies were  reviewed today:  June 01, 2021 echo personally reviewed Left ventricular function 25% Ventricular dyssynchrony noted. EKG shows bundle branch block during echo  June 2022 right left heart catheterization reviewed   EKG:  The ekg ordered today demonstrates sinus rhythm.  QRS duration 98 ms.  EKGs from September 29, July 26 and December 02, 2020 show a wide left bundle branch block   Recent Labs: 11/25/2020: ALT 8; Magnesium 2.2 12/06/2020: TSH 6.831 12/07/2020: B Natriuretic Peptide 251.0 01/10/2021: Hemoglobin CANCELED; Platelets CANCELED 03/13/2021: BUN 37; Creatinine, Ser 1.43; Potassium 3.9; Sodium 138  Recent Lipid Panel    Component Value Date/Time   CHOL 162 11/25/2020 1053   TRIG 98 11/25/2020 1053   HDL 41 11/25/2020 1053   CHOLHDL 4.0 11/25/2020 1053   VLDL 20 11/25/2020 1053   LDLCALC 101 (H) 11/25/2020 1053    Physical Exam:    VS:  BP 134/84 (BP Location: Left Arm, Patient Position: Sitting, Cuff Size: Normal)    Pulse 91    Ht 5\' 6"  (1.676 m)    Wt 203 lb (92.1 kg)    SpO2 98%    BMI 32.77 kg/m     Wt Readings from Last 3 Encounters:  07/26/21 203 lb (92.1 kg)  06/01/21 207 lb 2 oz (94 kg)  05/15/21 208 lb 8 oz (94.6 kg)     GEN:  Well nourished, well developed in no acute distress HEENT: Normal NECK: No JVD; No carotid bruits LYMPHATICS: No lymphadenopathy CARDIAC: RRR, no murmurs, rubs, gallops RESPIRATORY:  Clear to auscultation without rales, wheezing or rhonchi  ABDOMEN: Soft, non-tender, non-distended MUSCULOSKELETAL:  No edema; No deformity  SKIN: Warm and dry NEUROLOGIC:  Alert and oriented x 3 PSYCHIATRIC:  Normal affect       ASSESSMENT:    1. Non-ischemic cardiomyopathy ([place])   2. Chronic systolic heart failure ([place])   3. Essential hypertension   4. Left bundle branch block    PLAN:    In order of problems listed above:  #Chronic systolic heart failure NYHA class II-III.  Warm and euvolemic on exam today.  On good medical  therapy.  I discussed her risk for sudden cardiac death during today's clinic appointment.  We discussed the role of a defibrillator in her care.  The patient has a history of persistent left bundle branch block but interestingly today has a narrow QRS at 98 ms.  Her bundle branch block may be intermittent but I suspect it is contributing to her cardiomyopathy given the dyssynchrony noted on echocardiogram.  Given she is a candidate for ICD, favor CRT-D implant.  I discussed the procedure in detail with the patient during the clinic appointment including the risk, recovery and she wishes to proceed.  The patient has a nonischemic CM (EF 25%), NYHA  Class III CHF, and CAD.  He is referred by Cadence for for risk stratification of sudden death and consideration of ICD implantation.  At this time, she meets criteria for ICD implantation for primary prevention of sudden death.  I have had a thorough discussion with the patient reviewing options.  The patient and their family (if available) have had opportunities to ask questions and have them answered. The patient and I have decided together through a shared decision making process to proceed with ICD implant at this time.    Risks, benefits, alternatives to ICD implantation were discussed in detail with the patient today. The patient understands that the risks include but are not limited to bleeding, infection, pneumothorax, perforation, tamponade, vascular damage, renal failure, MI, stroke, death, inappropriate shocks, and lead dislodgement and wishes to proceed.  We will therefore schedule device implantation at the next available time.  #Hypertension Controlled  #Bundle-branch block Intermittent.  See #1 above.      Total time spent with patient today 60 minutes. This includes reviewing records, evaluating the patient and coordinating care.  Medication Adjustments/Labs and Tests Ordered: Current medicines are reviewed at length with the patient  today.  Concerns regarding medicines are outlined above.  Orders Placed This Encounter  Procedures   CBC w/Diff   Basic Metabolic Panel (BMET)   EKG 12-Lead   No orders of the defined types were placed in this encounter.    Signed, Hilton Cork. Quentin Ore, MD, [facility], [facility] 07/26/2021 4:48 PM    Electrophysiology [facility]

## 2021-09-21 ENCOUNTER — Ambulatory Visit (INDEPENDENT_AMBULATORY_CARE_PROVIDER_SITE_OTHER): Payer: Medicare PPO | Admitting: Internal Medicine

## 2021-09-21 ENCOUNTER — Encounter: Payer: Self-pay | Admitting: Internal Medicine

## 2021-09-21 ENCOUNTER — Other Ambulatory Visit
Admission: RE | Admit: 2021-09-21 | Discharge: 2021-09-21 | Disposition: A | Discharge status: Home / Self Care | Payer: Medicare PPO | Source: Ambulatory Visit | Attending: Internal Medicine | Admitting: Internal Medicine

## 2021-09-21 VITALS — BP 148/82 | HR 85 | Ht 60.0 in | Wt 206.0 lb

## 2021-09-21 DIAGNOSIS — I2510 Atherosclerotic heart disease of native coronary artery without angina pectoris: Secondary | ICD-10-CM

## 2021-09-21 DIAGNOSIS — I5022 Chronic systolic (congestive) heart failure: Secondary | ICD-10-CM

## 2021-09-21 DIAGNOSIS — I10 Essential (primary) hypertension: Secondary | ICD-10-CM | POA: Insufficient documentation

## 2021-09-21 DIAGNOSIS — E785 Hyperlipidemia, unspecified: Secondary | ICD-10-CM

## 2021-09-21 DIAGNOSIS — I428 Other cardiomyopathies: Secondary | ICD-10-CM

## 2021-09-21 LAB — COMPREHENSIVE METABOLIC PANEL
ALT: 7 U/L (ref 0–44)
AST: 15 U/L (ref 15–41)
Albumin: 4.1 g/dL (ref 3.5–5.0)
Alkaline Phosphatase: 47 U/L (ref 38–126)
Anion gap: 11 (ref 5–15)
BUN: 27 mg/dL — ABNORMAL HIGH (ref 8–23)
CO2: 19 mmol/L — ABNORMAL LOW (ref 22–32)
Calcium: 9.2 mg/dL (ref 8.9–10.3)
Chloride: 109 mmol/L (ref 98–111)
Creatinine, Ser: 1.21 mg/dL — ABNORMAL HIGH (ref 0.44–1.00)
GFR, Estimated: 48 mL/min — ABNORMAL LOW (ref >60)
Glucose, Bld: 137 mg/dL — ABNORMAL HIGH (ref 70–99)
Potassium: 3.6 mmol/L (ref 3.5–5.1)
Sodium: 139 mmol/L (ref 135–145)
Total Bilirubin: 0.6 mg/dL (ref 0.3–1.2)
Total Protein: 7.9 g/dL (ref 6.5–8.1)

## 2021-09-21 LAB — CBC
HCT: 44 % (ref 36.0–46.0)
Hemoglobin: 14.9 g/dL (ref 12.0–15.0)
MCH: 31.4 pg (ref 26.0–34.0)
MCHC: 33.9 g/dL (ref 30.0–36.0)
MCV: 92.8 fL (ref 80.0–100.0)
Platelets: 235 10*3/uL (ref 150–400)
RBC: 4.74 MIL/uL (ref 3.87–5.11)
RDW: 12.8 % (ref 11.5–15.5)
WBC: 5.1 10*3/uL (ref 4.0–10.5)
nRBC: 0 % (ref 0.0–0.2)

## 2021-09-21 MED ORDER — ROSUVASTATIN CALCIUM 10 MG PO TABS: 10.0000 mg | ORAL_TABLET | Freq: Every day | ORAL | 3 refills | Status: DC

## 2021-09-21 MED ORDER — CARVEDILOL 3.125 MG PO TABS: 3.1250 mg | ORAL_TABLET | Freq: Two times a day (BID) | ORAL | 3 refills | Status: DC

## 2021-09-21 NOTE — Progress Notes (Signed)
? ?Follow-up Outpatient Visit ?Date: 09/21/2021 ? ?Primary Care Provider: ?None ? ?Chief Complaint: Follow-up HFrEF and coronary artery disease ? ?HPI:  Evelyn Dunn is a 70 y.o. female with history of moderate LAD disease, HTN, CKD 3, HFrEF, NICM, mod to severe MR, LBBB, tobacco use, who presents for follow-up of cardiomyopathy.  She was last seen in our office in 02/2021 by Cadence Furth, PA, at which time she was feeling well.  Follow-up echo in 05/2021 showed LVEF of 25-30% with mild-moderate MR.  She was referred to electrophysiology with plans for CRT-D implantation next month by Dr. Quentin Ore. ? ?Today, Evelyn Dunn reports that she is feeling well without chest pain, shortness of breath, palpitations, lightheadedness, or edema despite being off all medications for several weeks.  She still has some Entresto at home but does not have any other medications due to cost constraints.  She was tolerating the medications well. ? ?-------------------------------------------------------------------------------------------------- ? ?Past Medical History:  ?Diagnosis Date  ? Chronic HFrEF (heart failure with reduced ejection fraction) ([place])   ? a. 11/2020 Echo: EF 20-25%, glob HK. Nl RV size/fxn, sev BAE. Mod-sev MR. Mild AI; b. 11/2020 Cath: CO/CI 3.6/1.9 respectively.  ? CKD (chronic kidney disease), stage III ([place])   ? Hypertension   ? a. 11/2020 Renal Duplex: No high grade RAS.  ? LBBB (left bundle branch block)   ? Moderate to severe mitral regurgitation   ? NICM (nonischemic cardiomyopathy) ([place])   ? a. 11/2020 Echo: EF 20-25%, glob HK; b. 11/2020 Cath: nonobs dzs.  ? Nonobstructive CAD (coronary artery disease)   ? a. 11/2020 Cath: LM nl, LAD 25p, 65/33m, D2 30, LCX 8m, OM1 mod dzs, RCA mild diff dzs, RPDA 20-->Med rx.  ? ?Past Surgical History:  ?Procedure Laterality Date  ? RIGHT/LEFT HEART CATH AND CORONARY ANGIOGRAPHY N/A 12/05/2020  ? Procedure: RIGHT/LEFT HEART CATH AND CORONARY ANGIOGRAPHY;  Surgeon: Nelva Bush,  MD;  Location: [facility];  Service: Cardiovascular;  Laterality: N/A;  ? ? ?Current Meds  ?Medication Sig  ? [DISCONTINUED] carvedilol (COREG) 3.125 MG tablet Take 1 tablet (3.125 mg total) by mouth 2 (two) times daily.  ? [DISCONTINUED] rosuvastatin (CRESTOR) 10 MG tablet Take 1 tablet (10 mg total) by mouth daily.  ? ? ?Allergies: Patient has no known allergies. ? ?Social History  ? ?Tobacco Use  ? Smoking status: Every Day  ?  Packs/day: 0.25  ?  Types: Cigarettes  ? Smokeless tobacco: Never  ?Vaping Use  ? Vaping Use: Never used  ?Substance Use Topics  ? Alcohol use: Not Currently  ?  Alcohol/week: 7.0 standard drinks  ?  Types: 7 Shots of liquor per week  ? Drug use: Never  ? ? ?Family History  ?Problem Relation Age of Onset  ? Valvular heart disease Mother   ? Heart failure Father   ? Coronary artery disease Sister   ? Vascular Disease Brother   ? ? ?Review of Systems: ?A 12-system review of systems was performed and was negative except as noted in the HPI. ? ?-------------------------------------------------------------------------------------------------- ? ?Physical Exam: ?BP (!) 148/82 (BP Location: Left Arm, Patient Position: Sitting, Cuff Size: Normal)   Pulse 85   Ht 5' (1.524 m)   Wt 206 lb (93.4 kg)   SpO2 96%   BMI 40.23 kg/m?  ? ?General:  NAD. ?Neck: No JVD or HJR. ?Lungs: Clear to auscultation bilaterally without wheezes or crackles. ?Heart: Regular rate and rhythm without murmurs, rubs, or gallops. ?Abdomen: Soft, nontender, nondistended. ?  Extremities: No lower extremity edema. ? ?EKG: Normal sinus rhythm with left atrial enlargement and left bundle branch block.  Compared with prior tracing from 08/02/2021, QRS has widened with LBBB now present (noted as recently as 03/16/2021). ? ?Lab Results  ?Component Value Date  ? WBC 5.1 09/21/2021  ? HGB 14.9 09/21/2021  ? HCT 44.0 09/21/2021  ? MCV 92.8 09/21/2021  ? PLT 235 09/21/2021  ? ? ?Lab Results  ?Component Value Date  ? NA 139  09/21/2021  ? K 3.6 09/21/2021  ? CL 109 09/21/2021  ? CO2 19 (L) 09/21/2021  ? BUN 27 (H) 09/21/2021  ? CREATININE 1.21 (H) 09/21/2021  ? GLUCOSE 137 (H) 09/21/2021  ? ALT 7 09/21/2021  ? ? ?Lab Results  ?Component Value Date  ? CHOL 162 11/25/2020  ? HDL 41 11/25/2020  ? LDLCALC 101 (H) 11/25/2020  ? TRIG 98 11/25/2020  ? CHOLHDL 4.0 11/25/2020  ? ? ?-------------------------------------------------------------------------------------------------- ? ?ASSESSMENT AND PLAN: ?Chronic HFrEF due to nonischemic cardiomyopathy: ?Evelyn Dunn reports feeling well with NYHA class I symptoms despite being off all GDMT.  We discussed the importance of medical therapy to help prevent progression of symptomatic heart failure as well as to improve survival.  I encouraged her to restart Entresto 24-26 mg daily, as she still has some of this at home.  We will provide her with paperwork to apply for pharmacy assistance for both Entresto and dapagliflozin.  I will have her restart aspirin 81 mg daily as well as begin rosuvastatin 10 mg daily and carvedilol 3.125 mg twice daily (both medications are more affordable than atorvastatin and metoprolol succinate through [facility]).  Ms. Dealba is scheduled for BiV pacemaker/defibrillator placement with Dr. Quentin Ore early next month.  I will check a CBC and CMP today in anticipation of these medication changes and upcoming device placement. ? ?Coronary artery disease: ?At least moderate LAD disease was noted on catheterization last year, though degree of cardiomyopathy was out of proportion to CAD.  Evelyn Dunn does not have any angina.  We will therefore continue with medical therapy to prevent progression.  As outlined above, we will have her restart aspirin 81 mg daily as well as begin rosuvastatin 10 mg daily and carvedilol 3.125 mg twice daily.  She will need a CMP and lipid panel in about 3 months. ? ?Hypertension: ?Blood pressure mildly elevated today, albeit off all pharmacotherapy.  We  will restart agents, as above. ? ?Hyperlipidemia: ?LDL was mildly elevated on last check in 11/2020.  Given that Ms. Astudillo has been off statin therapy, we will defer rechecking a lipid panel today but have her begin rosuvastatin 10 mg daily.  CMP and lipid panel will need to be rechecked in about 3 months. ? ?Morbid obesity: ?BMI greater than 40.  Weight loss encouraged through diet and exercise. ? ?Follow-up: Return to clinic in 6 months. ? ?Nelva Bush, MD ?09/22/2021 ?11:20 AM ? ?

## 2021-09-21 NOTE — Patient Instructions (Addendum)
Medication Instructions:  ? ?Your physician has recommended you make the following change in your medication:  ? ?STOP Metoprolol (this has been changed to Carvedilol) ? ?START Carvedilol 3.125 mg TWICE daily  ? ?STOP Atorvastatin (this has been changed to Rosuvastatin) ? ?START Rosuvastatin 10 mg daily - We have given you a Good Rx coupon to use at the pharmacy ? ?RESTART Entresto 24-26 mg TWICE daily - We have given patient assistance application. Please fill out and sign and return to our office with requested documentation. We will fax completed application for you.  ? ?RESTART Farxiga 10 mg daily - We have given patient assistance application. Please fill out and sign and return to our office with requested documentation. We will fax completed application for you.  ? ?RESTART Aspirin 81 mg daily - We have given samples today in office ? ?*If you need a refill on your cardiac medications before your next appointment, please call your pharmacy* ? ? ?Lab Work: ? ?Today at the medical mall at [facility]: CBC, CMET ? ?If you have labs (blood work) drawn today and your tests are completely normal, you will receive your results only by: ?MyChart Message (if you have MyChart) OR ?A paper copy in the mail ?If you have any lab test that is abnormal or we need to change your treatment, we will call you to review the results. ? ? ?Testing/Procedures: ? ?None ordered ? ? ?Follow-Up: ?At [facility], you and your health needs are our priority.  As part of our continuing mission to provide you with exceptional heart care, we have created designated Provider Care Teams.  These Care Teams include your primary Cardiologist (physician) and Advanced Practice Providers (APPs -  Physician Assistants and Nurse Practitioners) who all work together to provide you with the care you need, when you need it. ? ?We recommend signing up for the patient portal called "MyChart".  Sign up information is provided on this After Visit Summary.   MyChart is used to connect with patients for Virtual Visits (Telemedicine).  Patients are able to view lab/test results, encounter notes, upcoming appointments, etc.  Non-urgent messages can be sent to your provider as well.   ?To learn more about what you can do with MyChart, go to [email or web address].   ? ?Your next appointment:   ?6 week(s) ? ?The format for your next appointment:   ?In Person ? ?Provider:   ?You may see Yvonne Kendall, MD or one of the following Advanced Practice Providers on your designated Care Team:   ?Nicolasa Ducking, NP ?Eula Listen, PA-C ?Cadence Fransico Michael, PA-C ?

## 2021-09-22 ENCOUNTER — Encounter: Payer: Self-pay | Admitting: Internal Medicine

## 2021-09-22 DIAGNOSIS — E785 Hyperlipidemia, unspecified: Secondary | ICD-10-CM | POA: Insufficient documentation

## 2021-09-22 DIAGNOSIS — I5022 Chronic systolic (congestive) heart failure: Secondary | ICD-10-CM | POA: Insufficient documentation

## 2021-09-22 DIAGNOSIS — I428 Other cardiomyopathies: Secondary | ICD-10-CM | POA: Insufficient documentation

## 2021-10-02 ENCOUNTER — Other Ambulatory Visit (INDEPENDENT_AMBULATORY_CARE_PROVIDER_SITE_OTHER): Payer: Medicare PPO

## 2021-10-02 ENCOUNTER — Other Ambulatory Visit: Payer: Medicare PPO

## 2021-10-02 DIAGNOSIS — I428 Other cardiomyopathies: Secondary | ICD-10-CM

## 2021-10-03 LAB — BASIC METABOLIC PANEL
BUN/Creatinine Ratio: 13 (ref 12–28)
BUN: 17 mg/dL (ref 8–27)
CO2: 18 mmol/L — ABNORMAL LOW (ref 20–29)
Calcium: 9.6 mg/dL (ref 8.7–10.3)
Chloride: 106 mmol/L (ref 96–106)
Creatinine, Ser: 1.34 mg/dL — ABNORMAL HIGH (ref 0.57–1.00)
Glucose: 162 mg/dL — ABNORMAL HIGH (ref 70–99)
Potassium: 4.4 mmol/L (ref 3.5–5.2)
Sodium: 140 mmol/L (ref 134–144)
eGFR: 43 mL/min/{1.73_m2} — ABNORMAL LOW (ref >59)

## 2021-10-03 LAB — CBC WITH DIFFERENTIAL/PLATELET
Basophils Absolute: 0 10*3/uL (ref 0.0–0.2)
Basos: 1 %
EOS (ABSOLUTE): 0 10*3/uL (ref 0.0–0.4)
Eos: 1 %
Hematocrit: 44.1 % (ref 34.0–46.6)
Hemoglobin: 15.6 g/dL (ref 11.1–15.9)
Immature Grans (Abs): 0 10*3/uL (ref 0.0–0.1)
Immature Granulocytes: 0 %
Lymphocytes Absolute: 1.5 10*3/uL (ref 0.7–3.1)
Lymphs: 37 %
MCH: 33 pg (ref 26.6–33.0)
MCHC: 35.4 g/dL (ref 31.5–35.7)
MCV: 93 fL (ref 79–97)
Monocytes Absolute: 0.3 10*3/uL (ref 0.1–0.9)
Monocytes: 6 %
Neutrophils Absolute: 2.2 10*3/uL (ref 1.4–7.0)
Neutrophils: 55 %
Platelets: 205 10*3/uL (ref 150–450)
RBC: 4.73 x10E6/uL (ref 3.77–5.28)
RDW: 12.5 % (ref 11.7–15.4)
WBC: 4 10*3/uL (ref 3.4–10.8)

## 2021-10-13 NOTE — Pre-Procedure Instructions (Signed)
Instructed patient on the following items: Arrival time 1130 Nothing to eat or drink after midnight No meds AM of procedure Responsible person to drive you home and stay with you for 24 hrs Wash with special soap night before and morning of procedure  

## 2021-10-16 ENCOUNTER — Other Ambulatory Visit: Payer: Self-pay

## 2021-10-16 ENCOUNTER — Ambulatory Visit (HOSPITAL_COMMUNITY)
Admission: RE | Disposition: A | Discharge status: Home / Self Care | Payer: Medicare PPO | Source: Ambulatory Visit | Attending: Cardiology

## 2021-10-16 ENCOUNTER — Ambulatory Visit (HOSPITAL_COMMUNITY)
Admission: RE | Admit: 2021-10-16 | Discharge: 2021-10-17 | Disposition: A | Discharge status: Home / Self Care | Payer: Medicare PPO | Source: Ambulatory Visit | Attending: Cardiology | Admitting: Cardiology

## 2021-10-16 DIAGNOSIS — I2510 Atherosclerotic heart disease of native coronary artery without angina pectoris: Secondary | ICD-10-CM | POA: Diagnosis not present

## 2021-10-16 DIAGNOSIS — N183 Chronic kidney disease, stage 3 unspecified: Secondary | ICD-10-CM | POA: Diagnosis not present

## 2021-10-16 DIAGNOSIS — F17210 Nicotine dependence, cigarettes, uncomplicated: Secondary | ICD-10-CM | POA: Insufficient documentation

## 2021-10-16 DIAGNOSIS — I5022 Chronic systolic (congestive) heart failure: Secondary | ICD-10-CM | POA: Diagnosis not present

## 2021-10-16 DIAGNOSIS — I447 Left bundle-branch block, unspecified: Secondary | ICD-10-CM | POA: Insufficient documentation

## 2021-10-16 DIAGNOSIS — I469 Cardiac arrest, cause unspecified: Secondary | ICD-10-CM | POA: Diagnosis not present

## 2021-10-16 DIAGNOSIS — I130 Hypertensive heart and chronic kidney disease with heart failure and stage 1 through stage 4 chronic kidney disease, or unspecified chronic kidney disease: Secondary | ICD-10-CM | POA: Diagnosis not present

## 2021-10-16 DIAGNOSIS — I428 Other cardiomyopathies: Secondary | ICD-10-CM | POA: Diagnosis not present

## 2021-10-16 HISTORY — PX: BIV ICD INSERTION CRT-D: EP1195

## 2021-10-16 SURGERY — BIV ICD INSERTION CRT-D

## 2021-10-16 MED ORDER — CEFAZOLIN SODIUM-DEXTROSE 2-4 GM/100ML-% IV SOLN: 2.0000 g | INTRAVENOUS | Status: DC

## 2021-10-16 MED ORDER — SODIUM CHLORIDE 0.9 % IV SOLN
INTRAVENOUS | Status: DC | PRN
  Administered 2021-10-16: 80 mg

## 2021-10-16 MED ORDER — SODIUM CHLORIDE 0.9 % IV SOLN
80.0000 mg | INTRAVENOUS | Status: AC
  Administered 2021-10-16: 80 mg

## 2021-10-16 MED ORDER — SODIUM CHLORIDE 0.9 % IV SOLN: INTRAVENOUS | Status: DC

## 2021-10-16 MED ORDER — CEFAZOLIN SODIUM-DEXTROSE 2-3 GM-%(50ML) IV SOLR
INTRAVENOUS | Status: DC | PRN
  Administered 2021-10-16: 2 g via INTRAVENOUS

## 2021-10-16 MED ORDER — HEPARIN (PORCINE) IN NACL 1000-0.9 UT/500ML-% IV SOLN
INTRAVENOUS | Status: DC | PRN
Start: 2021-10-16 — End: 2021-10-16
  Administered 2021-10-16: 500 mL

## 2021-10-16 MED ORDER — CHLORHEXIDINE GLUCONATE 4 % EX LIQD: 4.0000 "application " | Freq: Once | CUTANEOUS | Status: DC

## 2021-10-16 MED ORDER — IOHEXOL 350 MG/ML SOLN
INTRAVENOUS | Status: DC | PRN
  Administered 2021-10-16: 20 mL

## 2021-10-16 MED ORDER — FENTANYL CITRATE (PF) 100 MCG/2ML IJ SOLN
INTRAMUSCULAR | Status: DC | PRN
Start: 2021-10-16 — End: 2021-10-16
  Administered 2021-10-16 (×6): 25 ug via INTRAVENOUS

## 2021-10-16 MED ORDER — HEPARIN (PORCINE) IN NACL 1000-0.9 UT/500ML-% IV SOLN
INTRAVENOUS | Status: AC
  Filled 2021-10-16: qty 500

## 2021-10-16 MED ORDER — CEFAZOLIN SODIUM-DEXTROSE 2-4 GM/100ML-% IV SOLN
INTRAVENOUS | Status: AC
  Filled 2021-10-16: qty 100

## 2021-10-16 MED ORDER — MIDAZOLAM HCL 5 MG/5ML IJ SOLN
INTRAMUSCULAR | Status: AC
  Filled 2021-10-16: qty 5

## 2021-10-16 MED ORDER — LIDOCAINE HCL (PF) 1 % IJ SOLN
INTRAMUSCULAR | Status: DC | PRN
  Administered 2021-10-16: 60 mL

## 2021-10-16 MED ORDER — POVIDONE-IODINE 10 % EX SWAB
2.0000 "application " | Freq: Once | CUTANEOUS | Status: AC
  Administered 2021-10-16: 2 via TOPICAL

## 2021-10-16 MED ORDER — ROSUVASTATIN CALCIUM 5 MG PO TABS: 10.0000 mg | ORAL_TABLET | Freq: Every day | ORAL | Status: DC

## 2021-10-16 MED ORDER — HYDROCODONE-ACETAMINOPHEN 5-325 MG PO TABS
1.0000 | ORAL_TABLET | Freq: Four times a day (QID) | ORAL | Status: DC | PRN
  Administered 2021-10-16 – 2021-10-17 (×3): 1 via ORAL
  Filled 2021-10-16 (×3): qty 1

## 2021-10-16 MED ORDER — ACETAMINOPHEN 325 MG PO TABS: 325.0000 mg | ORAL_TABLET | ORAL | Status: DC | PRN

## 2021-10-16 MED ORDER — CARVEDILOL 3.125 MG PO TABS: 3.1250 mg | ORAL_TABLET | Freq: Two times a day (BID) | ORAL | Status: DC

## 2021-10-16 MED ORDER — FENTANYL CITRATE (PF) 100 MCG/2ML IJ SOLN
INTRAMUSCULAR | Status: AC
  Filled 2021-10-16: qty 2

## 2021-10-16 MED ORDER — SODIUM CHLORIDE 0.9 % IV SOLN
INTRAVENOUS | Status: AC
  Filled 2021-10-16: qty 2

## 2021-10-16 MED ORDER — SACUBITRIL-VALSARTAN 24-26 MG PO TABS
1.0000 | ORAL_TABLET | Freq: Two times a day (BID) | ORAL | Status: DC
  Administered 2021-10-16 – 2021-10-17 (×2): 1 via ORAL
  Filled 2021-10-16 (×2): qty 1

## 2021-10-16 MED ORDER — MIDAZOLAM HCL 5 MG/5ML IJ SOLN
INTRAMUSCULAR | Status: DC | PRN
  Administered 2021-10-16 (×6): 1 mg via INTRAVENOUS

## 2021-10-16 MED ORDER — CARVEDILOL 3.125 MG PO TABS
3.1250 mg | ORAL_TABLET | Freq: Two times a day (BID) | ORAL | Status: DC
  Administered 2021-10-16 – 2021-10-17 (×2): 3.125 mg via ORAL
  Filled 2021-10-16 (×2): qty 1

## 2021-10-16 MED ORDER — ONDANSETRON HCL 4 MG/2ML IJ SOLN: 4.0000 mg | Freq: Four times a day (QID) | INTRAMUSCULAR | Status: DC | PRN

## 2021-10-16 MED ORDER — ROSUVASTATIN CALCIUM 5 MG PO TABS
10.0000 mg | ORAL_TABLET | Freq: Every day | ORAL | Status: DC
  Administered 2021-10-16: 10 mg via ORAL
  Filled 2021-10-16 (×2): qty 2

## 2021-10-16 MED ORDER — DIPHENHYDRAMINE HCL 25 MG PO CAPS: 50.0000 mg | ORAL_CAPSULE | Freq: Every evening | ORAL | Status: DC | PRN

## 2021-10-16 SURGICAL SUPPLY — 21 items
BALLN CATH 6FR 90 (CATHETERS) ×2
CABLE SURGICAL S-101-97-12 (CABLE) ×2 IMPLANT
CATH ACUITYPRO 45CM EH 9F (CATHETERS) ×1 IMPLANT
CATH ACUITYPRO IC-130 7F (CATHETERS) ×1
CATH BALLOON 6FR 90 2LUMEN (CATHETERS) IMPLANT
CATH GD CS-IC 130 CVD 60X7FR (CATHETERS) IMPLANT
CATH SELECT PACE 669183 (CATHETERS) ×1 IMPLANT
ICD MOMENTUM G124 (ICD Generator) ×1 IMPLANT
LEAD INGEVITY 7841 52 (Lead) ×1 IMPLANT
LEAD INGEVITY 7842 59 (Lead) ×1 IMPLANT
LEAD RELIANCE 0672 ×1 IMPLANT
PAD DEFIB RADIO PHYSIO CONN (PAD) ×2 IMPLANT
POUCH AIGIS-R ANTIBACT ICD (Mesh General) ×2 IMPLANT
POUCH AIGIS-R ANTIBACT ICD LRG (Mesh General) IMPLANT
SHEATH 7FR PRELUDE SNAP 13 (SHEATH) ×3 IMPLANT
SHEATH 8FR PRELUDE SNAP 13 (SHEATH) ×2 IMPLANT
SHEATH 9.5FR PRELUDE SNAP 13 (SHEATH) ×1 IMPLANT
TRAY PACEMAKER INSERTION (PACKS) ×2 IMPLANT
WIRE ACUITY WHISPER EDS 4648 (WIRE) ×2 IMPLANT
WIRE HI TORQ VERSACORE-J 145CM (WIRE) ×1 IMPLANT
WIRE MAILMAN 182CM (WIRE) ×1 IMPLANT

## 2021-10-16 NOTE — H&P (Signed)
?Electrophysiology Office Note:   ?  ?Date:  10/16/2021  ?  ?ID:  Evelyn Dunn, DOB [birth year], MRN [id number] ?  ?PCP:  Pcp, No          ?[facility] Cardiologist:  Nelva Bush, MD  ?[facility] Electrophysiologist:  Vickie Epley, MD  ?  ?Referring MD: Antony Madura, PA-C  ?  ?Chief Complaint: Chronic systolic heart failure ?  ?History of Present Illness:   ?  ?Evelyn Dunn is a 71 y.o. female who presents for an evaluation of chronic systolic heart failure at the request of Eulis Foster, PA-C. Their medical history includes nonischemic cardiomyopathy, CKD 3, hypertension, moderate-severe MR, left bundle branch block and tobacco abuse.  The patient Lycelle cadence on March 16, 2021.  The patient is also followed by Darylene Price in the heart failure clinic in [place].  The patient has been hospitalized in the last year with heart failure.  She has been taking good medications and is currently on [place], Toprol, Farxiga.  She tells me she is fairly active but still gets dyspneic with significant exertion.  She is referred to discuss ICD implant. ?  ?  ?  ?Objective  ?  ?    ?Past Medical History:  ?Diagnosis Date  ? Chronic HFrEF (heart failure with reduced ejection fraction) ([place])    ?  a. 11/2020 Echo: EF 20-25%, glob HK. Nl RV size/fxn, sev BAE. Mod-sev MR. Mild AI; b. 11/2020 Cath: CO/CI 3.6/1.9 respectively.  ? CKD (chronic kidney disease), stage III ([place])    ? Hypertension    ?  a. 11/2020 Renal Duplex: No high grade RAS.  ? LBBB (left bundle branch block)    ? Moderate to severe mitral regurgitation    ? NICM (nonischemic cardiomyopathy) ([place])    ?  a. 11/2020 Echo: EF 20-25%, glob HK; b. 11/2020 Cath: nonobs dzs.  ? Nonobstructive CAD (coronary artery disease)    ?  a. 11/2020 Cath: LM nl, LAD 25p, 65/84m, D2 30, LCX 38m, OM1 mod dzs, RCA mild diff dzs, RPDA 20-->Med rx.  ?  ?  ?     ?Past Surgical History:  ?Procedure Laterality Date  ? RIGHT/LEFT HEART CATH AND CORONARY ANGIOGRAPHY  N/A 12/05/2020  ?  Procedure: RIGHT/LEFT HEART CATH AND CORONARY ANGIOGRAPHY;  Surgeon: Nelva Bush, MD;  Location: [facility];  Service: Cardiovascular;  Laterality: N/A;  ?  ?  ?Current Medications: ?Active Medications  ?    ?Current Meds  ?Medication Sig  ? aspirin 81 MG chewable tablet Chew 1 tablet (81 mg total) by mouth daily.  ? atorvastatin (LIPITOR) 40 MG tablet Take 1 tablet (40 mg total) by mouth daily.  ? dapagliflozin propanediol (FARXIGA) 10 MG TABS tablet Take 1 tablet (10 mg total) by mouth daily before breakfast.  ? metoprolol succinate (TOPROL-XL) 25 MG 24 hr tablet Take 1 tablet (25 mg total) by mouth daily.  ? sacubitril-valsartan (ENTRESTO) 24-26 MG Take 1 tablet by mouth 2 (two) times daily.  ?  ?  ?  ?Allergies:   Patient has no known allergies.  ?  ?Social History  ?  ?     ?Socioeconomic History  ? Marital status: Married  ?    Spouse name: Not on file  ? Number of children: Not on file  ? Years of education: Not on file  ? Highest education level: Not on file  ?Occupational History  ? Not on file  ?Tobacco Use  ?  Smoking status: Every Day  ?    Packs/day: 0.25  ?    Types: Cigarettes  ? Smokeless tobacco: Never  ?Substance and Sexual Activity  ? Alcohol use: Not Currently  ?    Alcohol/week: 7.0 standard drinks  ?    Types: 7 Shots of liquor per week  ? Drug use: Never  ? Sexual activity: Not on file  ?Other Topics Concern  ? Not on file  ?Social History Narrative  ? Not on file  ?  ?Social Determinants of Health  ?  ?Financial Resource Strain: Not on file  ?Food Insecurity: Not on file  ?Transportation Needs: Not on file  ?Physical Activity: Not on file  ?Stress: Not on file  ?Social Connections: Not on file  ?  ?  ?Family History: ?The patient's family history includes Coronary artery disease in her sister; Heart failure in her father; Valvular heart disease in her mother; Vascular Disease in her brother. ?  ?ROS:   ?Please see the history of present illness.    ?All other  systems reviewed and are negative. ?  ?EKGs/Labs/Other Studies Reviewed:   ?  ?The following studies were reviewed today: ?  ?2021-06-16 echo personally reviewed ?Left ventricular function 25% ?Ventricular dyssynchrony noted. ?EKG shows bundle branch block during echo ? ?June 2022 right left heart catheterization reviewed ?  ?  ?EKG:  The ekg ordered today demonstrates sinus rhythm.  QRS duration 98 ms. ? ?EKGs from September 29, July 26 and December 02, 2020 show a wide left bundle branch block ?  ?  ?Recent Labs: ?11/25/2020: ALT 8; Magnesium 2.2 ?12/06/2020: TSH 6.831 ?12/07/2020: B Natriuretic Peptide 251.0 ?01/10/2021: Hemoglobin CANCELED; Platelets CANCELED ?03/13/2021: BUN 37; Creatinine, Ser 1.43; Potassium 3.9; Sodium 138  ?Recent Lipid Panel ?Labs (Brief)  ?     ?   ?Component Value Date/Time  ?  CHOL 162 11/25/2020 1053  ?  TRIG 98 11/25/2020 1053  ?  HDL 41 11/25/2020 1053  ?  CHOLHDL 4.0 11/25/2020 1053  ?  VLDL 20 11/25/2020 1053  ?  LDLCALC 101 (H) 11/25/2020 1053  ?  ?  ?  ?Physical Exam:   ?  ?VS:  BP 133/86 (BP Location: Left Arm, Patient Position: Sitting, Cuff Size: Normal)   Pulse 64   Ht 5\' 6"  (1.676 m)   Wt 203 lb (92.1 kg)   SpO2 98%   BMI 32.77 kg/m?    ?  ?   ?Wt Readings from Last 3 Encounters:  ?07/26/21 203 lb (92.1 kg)  ?June 16, 2021 207 lb 2 oz (94 kg)  ?05/15/21 208 lb 8 oz (94.6 kg)  ?  ?  ?GEN:  Well nourished, well developed in no acute distress ?HEENT: Normal ?NECK: No JVD; No carotid bruits ?LYMPHATICS: No lymphadenopathy ?CARDIAC: RRR, no murmurs, rubs, gallops ?RESPIRATORY:  Clear to auscultation without rales, wheezing or rhonchi  ?ABDOMEN: Soft, non-tender, non-distended ?MUSCULOSKELETAL:  No edema; No deformity  ?SKIN: Warm and dry ?NEUROLOGIC:  Alert and oriented x 3 ?PSYCHIATRIC:  Normal affect  ?  ?  ?  ?  ?Assessment ?  ?  ?ASSESSMENT:   ?  ?1. Non-ischemic cardiomyopathy ([place])   ?2. Chronic systolic heart failure ([place])   ?3. Essential hypertension   ?4. Left bundle  branch block   ?  ?PLAN:   ?  ?In order of problems listed above: ?  ?#Chronic systolic heart failure ?NYHA class II-III.  Warm and euvolemic on exam today.  On good  medical therapy.  I discussed her risk for sudden cardiac death during today's clinic appointment.  We discussed the role of a defibrillator in her care.  The patient has a history of persistent left bundle branch block but interestingly today has a narrow QRS at 98 ms.  Her bundle branch block may be intermittent but I suspect it is contributing to her cardiomyopathy given the dyssynchrony noted on echocardiogram.  Given she is a candidate for ICD, favor CRT-D implant. ?  ?I discussed the procedure in detail with the patient during the clinic appointment including the risk, recovery and she wishes to proceed. ? ?The patient has a nonischemic CM (EF 25%), NYHA Class III CHF, and CAD.  He is referred by Cadence for for risk stratification of sudden death and consideration of ICD implantation.  At this time, she meets criteria for ICD implantation for primary prevention of sudden death.  I have had a thorough discussion with the patient reviewing options.  The patient and their family (if available) have had opportunities to ask questions and have them answered. The patient and I have decided together through a shared decision making process to proceed with ICD implant at this time.   ?  ?Risks, benefits, alternatives to ICD implantation were discussed in detail with the patient today. The patient understands that the risks include but are not limited to bleeding, infection, pneumothorax, perforation, tamponade, vascular damage, renal failure, MI, stroke, death, inappropriate shocks, and lead dislodgement and wishes to proceed.  We will therefore schedule device implantation at the next available time. ?  ?#Hypertension ?Controlled ? ?#Bundle-branch block ?Intermittent.  See #1 above. ?  ?  ?  ?  ?  ?Signed, ?Lysbeth Galas T. Quentin Ore, MD, [facility],  [place] ?10/16/2021 ?Electrophysiology ?[place] ?  ?  ?

## 2021-10-17 ENCOUNTER — Ambulatory Visit (HOSPITAL_COMMUNITY): Payer: Medicare PPO

## 2021-10-17 ENCOUNTER — Encounter (HOSPITAL_COMMUNITY): Payer: Self-pay | Admitting: Cardiology

## 2021-10-17 DIAGNOSIS — I130 Hypertensive heart and chronic kidney disease with heart failure and stage 1 through stage 4 chronic kidney disease, or unspecified chronic kidney disease: Secondary | ICD-10-CM | POA: Diagnosis not present

## 2021-10-17 DIAGNOSIS — I5022 Chronic systolic (congestive) heart failure: Secondary | ICD-10-CM | POA: Diagnosis not present

## 2021-10-17 DIAGNOSIS — N183 Chronic kidney disease, stage 3 unspecified: Secondary | ICD-10-CM | POA: Diagnosis not present

## 2021-10-17 DIAGNOSIS — I428 Other cardiomyopathies: Secondary | ICD-10-CM | POA: Diagnosis not present

## 2021-10-17 MED ORDER — ACETAMINOPHEN 325 MG PO TABS: 325.0000 mg | ORAL_TABLET | ORAL | Status: AC | PRN

## 2021-10-17 MED ORDER — ASPIRIN 81 MG PO CHEW
81.0000 mg | CHEWABLE_TABLET | Freq: Every day | ORAL | 0 refills | Status: AC
Start: 2021-10-21 — End: ?

## 2021-10-17 NOTE — Discharge Summary (Addendum)
? ? ? ?ELECTROPHYSIOLOGY PROCEDURE DISCHARGE SUMMARY  ? ? ?Patient ID: Evelyn Dunn,  ?MRN: [id number], DOB/AGE: 71-Dec-1952 71 y.o. ? ?Admit date: 10/16/2021 ?Discharge date: 10/17/2021 ? ?Primary Care Physician: [facility]  ?Primary Cardiologist: Yvonne Kendall, MD  ?Electrophysiologist: Lanier Prude, MD  ? ?Primary Diagnosis:  ?Chronic systolic CHF  ? ?Secondary Diagnosis: ?Left bundle block ? ?No Known Allergies ? ? ?Procedures This Admission:  ?1.  Implantation of a [employer] BiV ICD (left bundle lead in CS/LV port) on 10/16/2021 by Dr. Lalla Brothers.  The patient received a [employer] model number G124 ICD with model number [id number] right atrial lead, [phone] right ventricular lead, and 7842 left ventricular lead.  DFT's were deferred at time of implant.  There were no immediate post procedure complications. ?2.  CXR on 10/17/21 demonstrated no pneumothorax status post device implantation. ? ?Brief HPI: ?Evelyn Dunn is a 71 y.o. female was referred to electrophysiology in the outpatient setting  for consideration of ICD implantation.  Past medical history includes above.  The patient has persistent LV dysfunction despite guideline directed therapy.  Risks, benefits, and alternatives to ICD implantation were reviewed with the patient who wished to proceed.  ? ?Hospital Course:  ?The patient was admitted and underwent implantation of a [employer] BiV ICD with details as outlined above. They were monitored on telemetry overnight which demonstrated appropriate pacing.  Left chest was without hematoma or ecchymosis.  The device was interrogated and found to be functioning normally.  CXR was obtained and demonstrated no pneumothorax status post device implantation..  Wound care, arm mobility, and restrictions were reviewed with the patient.  The patient was examined and considered stable for discharge to home.  ? ?The patient's discharge medications include an ACE-I/ARB/ARNI (entresto) and  beta blocker (coreg) ? ?Physical Exam: ?Vitals:  ? 10/16/21 1936 10/16/21 2006 10/17/21 0036 10/17/21 0454  ?BP: (!) 166/81 (!) 156/85 127/75   ?Pulse: 91 85 80   ?Resp:   19   ?Temp: 97.8 ?F (36.6 ?C)  98.5 ?F (36.9 ?C) 98.7 ?F (37.1 ?C)  ?TempSrc: Oral  Oral Oral  ?SpO2: 98% 99% 95%   ?Weight:      ?Height:      ? ? ?GEN- The patient is well appearing, alert and oriented x 3 today.   ?HEENT: normocephalic, atraumatic; sclera clear, conjunctiva pink; hearing intact; oropharynx clear; neck supple, no JVP ?Lymph- no cervical lymphadenopathy ?Lungs- Clear to ausculation bilaterally, normal work of breathing.  No wheezes, rales, rhonchi ?Heart- Regular rate and rhythm, no murmurs, rubs or gallops, PMI not laterally displaced ?GI- soft, non-tender, non-distended, bowel sounds present, no hepatosplenomegaly ?Extremities- no clubbing, cyanosis, or edema; DP/PT/radial pulses 2+ bilaterally ?MS- no significant deformity or atrophy ?Skin- warm and dry, no rash or lesion. ICD site stable. ?Psych- euthymic mood, full affect ?Neuro- strength and sensation are intact ? ? ?Labs: ?  ?Lab Results  ?Component Value Date  ? WBC 4.0 10/02/2021  ? HGB 15.6 10/02/2021  ? HCT 44.1 10/02/2021  ? MCV 93 10/02/2021  ? PLT 205 10/02/2021  ? No results for input(s): NA, K, CL, CO2, BUN, CREATININE, CALCIUM, PROT, BILITOT, ALKPHOS, ALT, AST, GLUCOSE in the last 168 hours. ? ?Invalid input(s): LABALBU ? ?Discharge Medications:  ?Allergies as of 10/17/2021   ?No Known Allergies ?  ? ?  ?Medication List  ?  ? ?TAKE these medications   ? ?acetaminophen 325 MG tablet ?Commonly known as: TYLENOL ?Take 1-2 tablets (  325-650 mg total) by mouth every 4 (four) hours as needed for mild pain. ?  ?aspirin 81 MG chewable tablet ?Chew 1 tablet (81 mg total) by mouth daily. ?Start taking on: Oct 21, 2021 ?What changed: These instructions start on Oct 21, 2021. If you are unsure what to do until then, ask your doctor or other care provider. ?  ?carvedilol 3.125 MG  tablet ?Commonly known as: COREG ?Take 1 tablet (3.125 mg total) by mouth 2 (two) times daily. ?  ?diphenhydrAMINE 25 MG tablet ?Commonly known as: BENADRYL ?Take 50 mg by mouth at bedtime as needed for allergies or sleep. ?  ?famotidine 20 MG tablet ?Commonly known as: PEPCID ?Take 40 mg by mouth daily. ?  ?rosuvastatin 10 MG tablet ?Commonly known as: CRESTOR ?Take 1 tablet (10 mg total) by mouth daily. ?  ?sacubitril-valsartan 24-26 MG ?Commonly known as: ENTRESTO ?Take 1 tablet by mouth 2 (two) times daily. ?  ? ?  ? ? ?Disposition:  ? ? Follow-up Information   ? ? [facility] Follow up.   ?Why: on 5/11 at 12 noon for post pacemaker check ?Contact information: ?[address] ?[place] [address] ?[phone] ? ?  ?  ? ?  ?  ? ?  ? ? ?Duration of Discharge Encounter: Greater than 30 minutes including physician time. ? ?Signed, ?Graciella Freer, PA-C  ?10/17/2021 ?7:40 AM ? ? ? ? ?

## 2021-10-17 NOTE — Plan of Care (Signed)
?  Problem: Education: ?Goal: Will demonstrate proper wound care and an understanding of methods to prevent future damage ?Outcome: Adequate for Discharge ?Goal: Knowledge of disease or condition will improve ?Outcome: Adequate for Discharge ?Goal: Knowledge of the prescribed therapeutic regimen will improve ?Outcome: Adequate for Discharge ?Goal: Individualized Educational Video(s) ?Outcome: Adequate for Discharge ?  ?Problem: Activity: ?Goal: Risk for activity intolerance will decrease ?Outcome: Adequate for Discharge ?  ?Problem: Cardiac: ?Goal: Will achieve and/or maintain hemodynamic stability ?Outcome: Adequate for Discharge ?  ?Problem: Clinical Measurements: ?Goal: Postoperative complications will be avoided or minimized ?Outcome: Adequate for Discharge ?  ?Problem: Respiratory: ?Goal: Respiratory status will improve ?Outcome: Adequate for Discharge ?  ?Problem: Skin Integrity: ?Goal: Wound healing without signs and symptoms of infection ?Outcome: Adequate for Discharge ?Goal: Risk for impaired skin integrity will decrease ?Outcome: Adequate for Discharge ?  ?Problem: Urinary Elimination: ?Goal: Ability to achieve and maintain adequate renal perfusion and functioning will improve ?Outcome: Adequate for Discharge ?  ?Problem: Education: ?Goal: Knowledge of General Education information will improve ?Description: Including pain rating scale, medication(s)/side effects and non-pharmacologic comfort measures ?Outcome: Adequate for Discharge ?  ?Problem: Health Behavior/Discharge Planning: ?Goal: Ability to manage health-related needs will improve ?Outcome: Adequate for Discharge ?  ?Problem: Clinical Measurements: ?Goal: Ability to maintain clinical measurements within normal limits will improve ?Outcome: Adequate for Discharge ?Goal: Will remain free from infection ?Outcome: Adequate for Discharge ?Goal: Diagnostic test results will improve ?Outcome: Adequate for Discharge ?Goal: Respiratory complications will  improve ?Outcome: Adequate for Discharge ?Goal: Cardiovascular complication will be avoided ?Outcome: Adequate for Discharge ?  ?Problem: Activity: ?Goal: Risk for activity intolerance will decrease ?Outcome: Adequate for Discharge ?  ?Problem: Nutrition: ?Goal: Adequate nutrition will be maintained ?Outcome: Adequate for Discharge ?  ?Problem: Coping: ?Goal: Level of anxiety will decrease ?Outcome: Adequate for Discharge ?  ?Problem: Elimination: ?Goal: Will not experience complications related to bowel motility ?Outcome: Adequate for Discharge ?Goal: Will not experience complications related to urinary retention ?Outcome: Adequate for Discharge ?  ?Problem: Pain Managment: ?Goal: General experience of comfort will improve ?Outcome: Adequate for Discharge ?  ?Problem: Safety: ?Goal: Ability to remain free from injury will improve ?Outcome: Adequate for Discharge ?  ?Problem: Skin Integrity: ?Goal: Risk for impaired skin integrity will decrease ?Outcome: Adequate for Discharge ?  ?Problem: Education: ?Goal: Knowledge of cardiac device and self-care will improve ?Outcome: Adequate for Discharge ?Goal: Ability to safely manage health related needs after discharge will improve ?Outcome: Adequate for Discharge ?Goal: Individualized Educational Video(s) ?Outcome: Adequate for Discharge ?  ?Problem: Cardiac: ?Goal: Ability to achieve and maintain adequate cardiopulmonary perfusion will improve ?Outcome: Adequate for Discharge ?  ?

## 2021-10-17 NOTE — Discharge Instructions (Signed)
After Your Pacemaker ? ? ?You have a Environmental education officer ? ?ACTIVITY ?Do not lift your arm above shoulder height for 1 week after your procedure. After 7 days, you may progress as below.  ?You should remove your sling 24 hours after your procedure, unless otherwise instructed by your provider.  ? ? ? Tuesday Oct 24, 2021  Wednesday Oct 25, 2021 Thursday Oct 26, 2021 Friday Oct 27, 2021  ? ?Do not lift, push, pull, or carry anything over 10 pounds with the affected arm until 6 weeks (Tuesday November 28, 2021 ) after your procedure.  ? ?You may drive AFTER your wound check, unless you have been told otherwise by your provider.  ? ?Ask your healthcare provider when you can go back to work ? ? ?INCISION/Dressing ?If you are on a blood thinner such as Coumadin, Xarelto, Eliquis, Plavix, or Pradaxa please confirm with your provider when this should be resumed.  ? ?If large square, outer bandage is left in place, this can be removed after 24 hours from your procedure. Do not remove steri-strips or glue as below.  ? ?Monitor your Pacemaker site for redness, swelling, and drainage. Call the device clinic at [phone] if you experience these symptoms or fever/chills. ? ?If your incision is sealed with Steri-strips or staples, you may shower 10 days after your procedure or when told by your provider. Do not remove the steri-strips or let the shower hit directly on your site. You may wash around your site with soap and water.   ? ?If you were discharged in a sling, please do not wear this during the day more than 48 hours after your surgery unless otherwise instructed. This may increase the risk of stiffness and soreness in your shoulder.  ? ?Avoid lotions, ointments, or perfumes over your incision until it is well-healed. ? ?You may use a hot tub or a pool AFTER your wound check appointment if the incision is completely closed. ? ?Pacemaker Alerts:  Some alerts are vibratory and others beep. These are NOT emergencies.  Please call our office to let us know. If this occurs at night or on weekends, it can wait until the next business day. Send a remote transmission. ? ?If your device is capable of reading fluid status (for heart failure), you will be offered monthly monitoring to review this with you.  ? ?DEVICE MANAGEMENT ?Remote monitoring is used to monitor your pacemaker from home. This monitoring is scheduled every 91 days by our office. It allows [place] to keep an eye on the functioning of your device to ensure it is working properly. You will routinely see your Electrophysiologist annually (more often if necessary).  ? ?You should receive your ID card for your new device in 4-8 weeks. Keep this card with you at all times once received. Consider wearing a medical alert bracelet or necklace. ? ?Your Pacemaker may be MRI compatible. This will be discussed at your next office visit/wound check.  You should avoid contact with strong electric or magnetic fields.  ? ?Do not use amateur (ham) radio equipment or electric (arc) welding torches. MP3 player headphones with magnets should not be used. Some devices are safe to use if held at least 12 inches (30 cm) from your Pacemaker. These include power tools, lawn mowers, and speakers. If you are unsure if something is safe to use, ask your health care provider. ? ?When using your cell phone, hold it to the ear that is on the opposite side from  the Pacemaker. Do not leave your cell phone in a pocket over the Pacemaker. ? ?You may safely use electric blankets, heating pads, computers, and microwave ovens. ? ?Call the office right away if: ?You have chest pain. ?You feel more short of breath than you have felt before. ?You feel more light-headed than you have felt before. ?Your incision starts to open up. ? ?This information is not intended to replace advice given to you by your health care provider. Make sure you discuss any questions you have with your health care provider.  ?

## 2021-10-18 MED FILL — Cefazolin Sodium-Dextrose IV Solution 2 GM/100ML-4%: INTRAVENOUS | Qty: 100 | Status: AC

## 2021-10-26 ENCOUNTER — Ambulatory Visit: Payer: Medicare PPO

## 2021-10-26 LAB — CUP PACEART INCLINIC DEVICE CHECK
Brady Statistic RA Percent Paced: 1 % — CL
Brady Statistic RV Percent Paced: 1 % — CL
Date Time Interrogation Session: 20230511000000
HighPow Impedance: 66 Ohm
Implantable Lead Implant Date: 20230501
Implantable Lead Implant Date: 20230501
Implantable Lead Implant Date: 20230501
Implantable Lead Location: 753858
Implantable Lead Location: 753859
Implantable Lead Location: 753860
Implantable Lead Model: 672
Implantable Lead Model: 7841
Implantable Lead Model: 7842
Implantable Lead Serial Number: 1169613
Implantable Lead Serial Number: 1263550
Implantable Lead Serial Number: 185133
Implantable Pulse Generator Implant Date: 20230501
Lead Channel Impedance Value: 532 Ohm
Lead Channel Impedance Value: 575 Ohm
Lead Channel Impedance Value: 733 Ohm
Lead Channel Pacing Threshold Amplitude: 0.5 V
Lead Channel Pacing Threshold Amplitude: 0.7 V
Lead Channel Pacing Threshold Amplitude: 0.7 V
Lead Channel Pacing Threshold Pulse Width: 0.4 ms
Lead Channel Pacing Threshold Pulse Width: 0.4 ms
Lead Channel Pacing Threshold Pulse Width: 0.4 ms
Lead Channel Sensing Intrinsic Amplitude: 18.5 mV
Lead Channel Sensing Intrinsic Amplitude: 25 mV
Lead Channel Sensing Intrinsic Amplitude: 5.9 mV
Lead Channel Setting Pacing Amplitude: 3.5 V
Lead Channel Setting Pacing Amplitude: 3.5 V
Lead Channel Setting Pacing Amplitude: 3.5 V
Lead Channel Setting Pacing Pulse Width: 0.4 ms
Lead Channel Setting Pacing Pulse Width: 0.4 ms
Lead Channel Setting Sensing Sensitivity: 0.5 mV
Lead Channel Setting Sensing Sensitivity: 1 mV
Pulse Gen Serial Number: 511069

## 2021-10-26 NOTE — Progress Notes (Signed)
Wound check appointment s/p CRT-D implant 10/16/21. Steri-strips removed. Wound without redness or edema. Incision edges approximated, wound well healed. Normal device function. Thresholds, sensing, and impedances consistent with implant measurements. Device programmed at 3.5V for extra safety margin until 3 month visit. Histogram distribution appropriate for patient and level of activity. No mode switches or ventricular arrhythmias noted. LB pacing 100%. LV sensing turned off at implant secondary to frequent tracking per rep. Have to turn on to test LV threshold then off again to prevent tracking. Patient admits to not plugging in remote monitor as indicated on [employer] website. She will plug in remote monitor as soon as she returns home this evening. Next scheduled transmission 01/18/22. Patient educated about wound care, arm mobility, lifting restrictions, shock plan. ROV in 3 months with implanting physician. ?

## 2021-10-26 NOTE — Patient Instructions (Addendum)
? ?  After Your ICD ?(Implantable Cardiac Defibrillator) ? ? ? ?Monitor your defibrillator site for redness, swelling, and drainage. Call the device clinic at [phone] if you experience these symptoms or fever/chills. ? ?Your incision was closed with Steri-strips or staples:  You may shower and wash your incision with soap and water. Avoid lotions, ointments, or perfumes over your incision until it is well-healed. ? ?You may use a hot tub or a pool after your wound check appointment if the incision is completely closed. ? ?Do not lift, push or pull greater than 10 pounds with the affected arm until 6 weeks after your procedure. There are no other restrictions in arm movement after your wound check appointment. 11/28/31 ? ?Your ICD is MRI compatible. ? ?Your ICD is designed to protect you from life threatening heart rhythms. Because of this, you may receive a shock.  ? ?1 shock with no symptoms:  Call the office during business hours. ?1 shock with symptoms (chest pain, chest pressure, dizziness, lightheadedness, shortness of breath, overall feeling unwell):  Call 911. ?If you experience 2 or more shocks in 24 hours:  Call 911. ?If you receive a shock, you should not drive.  ?[place] DMV - no driving for 6 months if you receive appropriate therapy from your ICD.  ? ?ICD Alerts:  Some alerts are vibratory and others beep. These are NOT emergencies. Please call our office to let us know. If this occurs at night or on weekends, it can wait until the next business day. Send a remote transmission. ? ?If your device is capable of reading fluid status (for heart failure), you will be offered monthly monitoring to review this with you.  ? ?Remote monitoring is used to monitor your ICD from home. This monitoring is scheduled every 91 days by our office. It allows [place] to keep an eye on the functioning of your device to ensure it is working properly. You will routinely see your Electrophysiologist annually (more often if  necessary).  ?

## 2021-11-01 NOTE — Progress Notes (Signed)
Office Visit    Patient Name: Evelyn Dunn Date of Encounter: 11/01/2021  Primary Care Provider:  [facility] [place] Primary Cardiologist:  Nelva Bush, MD Primary Electrophysiologist: Vickie Epley, MD  Chief Complaint    Evelyn Dunn is a 71 y.o. female with PMH of HTN, CKD 3, HFrEF, NICM, mod to severe MR, LBBB, tobacco use presents for 6-week follow-up.  Past Medical History    Past Medical History:  Diagnosis Date   Chronic HFrEF (heart failure with reduced ejection fraction) ([place])    a. 11/2020 Echo: EF 20-25%, glob HK. Nl RV size/fxn, sev BAE. Mod-sev MR. Mild AI; b. 11/2020 Cath: CO/CI 3.6/1.9 respectively.   CKD (chronic kidney disease), stage III ([place])    Hypertension    a. 11/2020 Renal Duplex: No high grade RAS.   LBBB (left bundle branch block)    Moderate to severe mitral regurgitation    NICM (nonischemic cardiomyopathy) ([place])    a. 11/2020 Echo: EF 20-25%, glob HK; b. 11/2020 Cath: nonobs dzs.   Nonobstructive CAD (coronary artery disease)    a. 11/2020 Cath: LM nl, LAD 25p, 65/100m, D2 30, LCX 74m, OM1 mod dzs, RCA mild diff dzs, RPDA 20-->Med rx.   Past Surgical History:  Procedure Laterality Date   BIV ICD INSERTION CRT-D N/A 10/16/2021   Procedure: BIV ICD INSERTION CRT-D;  Surgeon: Vickie Epley, MD;  Location: [facility];  Service: Cardiovascular;  Laterality: N/A;   RIGHT/LEFT HEART CATH AND CORONARY ANGIOGRAPHY N/A 12/05/2020   Procedure: RIGHT/LEFT HEART CATH AND CORONARY ANGIOGRAPHY;  Surgeon: Nelva Bush, MD;  Location: [facility];  Service: Cardiovascular;  Laterality: N/A;    Allergies  No Known Allergies  History of Present Illness    Evelyn Dunn has a PMH of HTN, CKD 3, HFrEF, NICM, mod to severe MR, LBBB, tobacco use. She presented to [facility] on [facility]2020 with complaint of dyspnea and HTN urgency. 2D echo revealed EF 20-25%, global HK with moderate to severe MR. EKG revealed new LBBB and cardiac  work-up was negative for ACS.  She was initially started on IV Lasix however developed hypotension and creatinine increased to 2.75. R/LHC was performed by Dr. Saunders Revel on 6/20 revealed moderate to severe single-vessel was CAD-30% proximal 50-70% mid LAD stenosis.  Left heart filling pressures were moderate to severe, and right heart pulmonary pressure was severely elevated.  She was diuresed and GDMT was optimized prior to discharge with Entresto, Toprol, BiDil.  She presented on 01/10/2021 for hospital follow-up for dyspnea and HTN urgency. She was euvolemic and tachycardic on examination. She was intermittently followed by heart failure clinic. She was seen again on 03/16/2021 in office for NICM follow-up. During visit patient reported doing well and denies shortness of breath or CP. She was started on Farxiga by heart failure clinic and limited to titrating other GDMT due to CKD.  She was referred to Dr. Quentin Ore on 07/26/2021 for ICD implant.  She was seen by Dr. Saunders Revel on 09/2021 and reported that she has stopped all GDMT due to cost.  She was encouraged to restart Delene Loll and Farxiga and was started on 81 mg aspirin in addition to rosuvastatin 10 mg and carvedilol 3.125 mg.  She underwent successful [employer] CRT-D implant on 5 1/23 by Dr. Quentin Ore.  Since last being seen in the office patient reports she is doing well and denies any CP or SOB. She states that she has remained compliant with  her medications and  she is weighing her self daily.  She is abstaining from excess sodium and is euvolemic on examination. She did well with her recent BiV ICD implant.  Patient denies chest pain, palpitations, dyspnea, PND, orthopnea, nausea, vomiting, dizziness, syncope, edema, weight gain, or early satiety.   Home Medications    Current Outpatient Medications  Medication Sig Dispense Refill   acetaminophen (TYLENOL) 325 MG tablet Take 1-2 tablets (325-650 mg total) by mouth every 4 (four) hours as needed for mild  pain.     aspirin 81 MG chewable tablet Chew 1 tablet (81 mg total) by mouth daily. 90 tablet 0   carvedilol (COREG) 3.125 MG tablet Take 1 tablet (3.125 mg total) by mouth 2 (two) times daily. 60 tablet 3   diphenhydrAMINE (BENADRYL) 25 MG tablet Take 50 mg by mouth at bedtime as needed for allergies or sleep.     famotidine (PEPCID) 20 MG tablet Take 40 mg by mouth daily.     rosuvastatin (CRESTOR) 10 MG tablet Take 1 tablet (10 mg total) by mouth daily. 30 tablet 3   sacubitril-valsartan (ENTRESTO) 24-26 MG Take 1 tablet by mouth 2 (two) times daily. 60 tablet 3   No current facility-administered medications for this visit.     Review of Systems  Please see the history of present illness.    (+) Left upper incision pain  All other systems reviewed and are otherwise negative except as noted above.  Physical Exam    Wt Readings from Last 3 Encounters:  10/16/21 209 lb (94.8 kg)  09/21/21 206 lb (93.4 kg)  07/26/21 203 lb (92.1 kg)   [id number] were no vitals filed for this visit.,There is no height or weight on file to calculate BMI.  Constitutional:      Appearance: Healthy appearance. Not in distress.  Neck:     Vascular: JVD normal.  Pulmonary:     Effort: Pulmonary effort is normal.     Breath sounds: No wheezing. No rales. Diminished in the bases Cardiovascular:     Normal rate. Regular rhythm. Normal S1. Normal S2.      Murmurs: There is no murmur.  Edema:    Peripheral edema absent.  Abdominal:     Palpations: Abdomen is soft non tender. There is no hepatomegaly.  Skin:    General: Skin is warm and dry.  Neurological:     General: No focal deficit present.     Mental Status: Alert and oriented to person, place and time.     Cranial Nerves: Cranial nerves are intact.  EKG/LABS/Other Studies Reviewed    ECG personally reviewed by me today -none completed today  Lab Results  Component Value Date   WBC 4.0 10/02/2021   HGB 15.6 10/02/2021   HCT 44.1 10/02/2021    MCV 93 10/02/2021   PLT 205 10/02/2021   Lab Results  Component Value Date   CREATININE 1.34 (H) 10/02/2021   BUN 17 10/02/2021   NA 140 10/02/2021   K 4.4 10/02/2021   CL 106 10/02/2021   CO2 18 (L) 10/02/2021   Lab Results  Component Value Date   ALT 7 09/21/2021   AST 15 09/21/2021   ALKPHOS 47 09/21/2021   BILITOT 0.6 09/21/2021   Lab Results  Component Value Date   CHOL 162 11/25/2020   HDL 41 11/25/2020   LDLCALC 101 (H) 11/25/2020   TRIG 98 11/25/2020   CHOLHDL 4.0 11/25/2020    Lab Results  Component  Value Date   HGBA1C 6.4 (H) 11/25/2020    Assessment & Plan    1.  HFrEF: -Echo completed on 12/22 with EF of 25-30%.  Patient was euvolemic on examination today.  She recently underwent BiV ICD implantation tolerated well.  She denies any recent shortness of breath or chest pain. -Current GDMT consist of Coreg 3.125 mg, Entresto 24-26 mg.  She is limited in titration of CHF medications due to increased creatinine.  We will evaluate her renal function and plan to add Farxiga 10 mg daily to current GDMT -BMET today to evaluate renal function  2.  Coronary artery disease: -s/p R/LHC with moderate LAD noted and cardiomyopathy. -Continue secondary prevention with Crestor 10 mg daily, ASA 81 mg and carvedilol 3.125 mg twice daily Stable today with no anginal symptoms. No indication for ischemic evaluation.     3.  NICM/BIV ICD: -Echo completed on 12/22 with EF of 25-30%. -s/p [employer] BiV ICD implant on 5 1/23 by Dr. Quentin Ore  4.  Hypertension: -Blood pressure today was 110/80 well-controlled -Continue carvedilol 3.125 mg and Entresto 24-26 mg -continue low sodium heart healthy diet  5.  Morbid obesity: -BMI today 33.41 -Patient encouraged to dissipate in low impact cardiovascular exercise and advance as tolerated      Disposition: Follow-up with Nelva Bush, MD or APP in 6 months     Medication Adjustments/Labs and Tests Ordered: Current  medicines are reviewed at length with the patient today.  Concerns regarding medicines are outlined above.   Signed, Mable Fill, Marissa Nestle, NP 11/01/2021, 4:35 PM [place]

## 2021-11-03 ENCOUNTER — Other Ambulatory Visit
Admission: RE | Admit: 2021-11-03 | Discharge: 2021-11-03 | Disposition: A | Discharge status: Home / Self Care | Payer: Medicare PPO | Attending: Nurse Practitioner | Admitting: Nurse Practitioner

## 2021-11-03 ENCOUNTER — Encounter: Payer: Self-pay | Admitting: Physician Assistant

## 2021-11-03 ENCOUNTER — Ambulatory Visit (INDEPENDENT_AMBULATORY_CARE_PROVIDER_SITE_OTHER): Payer: Medicare PPO | Admitting: Nurse Practitioner

## 2021-11-03 VITALS — BP 110/80 | HR 82 | Ht 66.0 in | Wt 207.0 lb

## 2021-11-03 DIAGNOSIS — I2510 Atherosclerotic heart disease of native coronary artery without angina pectoris: Secondary | ICD-10-CM

## 2021-11-03 DIAGNOSIS — I428 Other cardiomyopathies: Secondary | ICD-10-CM

## 2021-11-03 DIAGNOSIS — E785 Hyperlipidemia, unspecified: Secondary | ICD-10-CM

## 2021-11-03 DIAGNOSIS — Z79899 Other long term (current) drug therapy: Secondary | ICD-10-CM | POA: Insufficient documentation

## 2021-11-03 DIAGNOSIS — I10 Essential (primary) hypertension: Secondary | ICD-10-CM

## 2021-11-03 DIAGNOSIS — I5022 Chronic systolic (congestive) heart failure: Secondary | ICD-10-CM

## 2021-11-03 LAB — BASIC METABOLIC PANEL
Anion gap: 7 (ref 5–15)
BUN: 30 mg/dL — ABNORMAL HIGH (ref 8–23)
CO2: 22 mmol/L (ref 22–32)
Calcium: 9.2 mg/dL (ref 8.9–10.3)
Chloride: 107 mmol/L (ref 98–111)
Creatinine, Ser: 1.28 mg/dL — ABNORMAL HIGH (ref 0.44–1.00)
GFR, Estimated: 45 mL/min — ABNORMAL LOW (ref >60)
Glucose, Bld: 141 mg/dL — ABNORMAL HIGH (ref 70–99)
Potassium: 4 mmol/L (ref 3.5–5.1)
Sodium: 136 mmol/L (ref 135–145)

## 2021-11-03 NOTE — Patient Instructions (Addendum)
Medication Instructions:  - Your physician recommends that you continue on your current medications as directed. Please refer to the Current Medication list given to you today.   Pending the results of your lab work today, we may start you on a medication- Farxiga 10 mg once daily  You will be notified of your lab results and whether not to start this mecation Patient portion of the Assistance Form was completed by you in clinic today and left with the nurse   *If you need a refill on your cardiac medications before your next appointment, please call your pharmacy*   Lab Work: - Your physician recommends that you have lab work today: [place] [facility] at [facility] 1st desk on the right to check in (REGISTRATION)  Lab hours: Monday- Friday (7:30 am- 5:30 pm)   If you have labs (blood work) drawn today and your tests are completely normal, you will receive your results only by: MyChart Message (if you have MyChart) OR A paper copy in the mail If you have any lab test that is abnormal or we need to change your treatment, we will call you to review the results.   Testing/Procedures: - none ordered   Follow-Up: At [facility], you and your health needs are our priority.  As part of our continuing mission to provide you with exceptional heart care, we have created designated Provider Care Teams.  These Care Teams include your primary Cardiologist (physician) and Advanced Practice Providers (APPs -  Physician Assistants and Nurse Practitioners) who all work together to provide you with the care you need, when you need it.  We recommend signing up for the patient portal called "MyChart".  Sign up information is provided on this After Visit Summary.  MyChart is used to connect with patients for Virtual Visits (Telemedicine).  Patients are able to view lab/test results, encounter notes, upcoming appointments, etc.  Non-urgent messages can be sent to your provider as well.   To learn  more about what you can do with MyChart, go to [email or web address].    Your next appointment:   2 month(s)  The format for your next appointment:   In Person  Provider:   You may see Nelva Bush, MD or one of the following Advanced Practice Providers on your designated Care Team:    Christell Faith, PA-C    Other Instructions  Dapagliflozin Tablets What is this medication? DAPAGLIFLOZIN (DAP a gli FLOE zin) treats type 2 diabetes. It works by helping your kidneys remove sugar (glucose) from your blood through the urine, which decreases your blood sugar. It can also be used to lower the risk of heart attack, stroke, kidney disease, and hospitalization for heart failure in people with type 2 diabetes. Changes to diet and exercise are often combined with this medication. This medicine may be used for other purposes; ask your health care provider or pharmacist if you have questions. COMMON BRAND NAME(S): Wilder Glade What should I tell my care team before I take this medication? They need to know if you have any of these conditions: Dehydration Diabetic ketoacidosis Diet low in salt Eating less due to illness, surgery, dieting, or any other reason Frequently drink alcohol Having surgery History of pancreatitis or pancreas problems History of yeast infection of the penis or vagina Infection in the bladder, kidneys, or urinary tract Kidney disease Low blood pressure On dialysis Problems urinating Type 1 diabetes Uncircumcised female An unusual or allergic reaction to dapagliflozin, other medications, foods, dyes, or  preservatives Pregnant or trying to get pregnant Breast-feeding How should I use this medication? Take this medication by mouth with water. Take it as directed on the prescription label at the same time every day. You can take it with or without food. If it upsets your stomach, take it with food. Keep taking it unless your care team tells you to stop. A special MedGuide  will be given to you by the pharmacist with each prescription and refill. Be sure to read this information carefully each time. Talk to your care team about the use of this medication in children. Special care may be needed. Overdosage: If you think you have taken too much of this medicine contact a poison control center or emergency room at once. NOTE: This medicine is only for you. Do not share this medicine with others. What if I miss a dose? If you miss a dose, take it as soon as you can. If it is almost time for your next dose, take only that dose. Do not take double or extra doses. What may interact with this medication? Lithium Sulfonylureas, such as glimepiride, glipizide, glyburide This list may not describe all possible interactions. Give your health care provider a list of all the medicines, herbs, non-prescription drugs, or dietary supplements you use. Also tell them if you smoke, drink alcohol, or use illegal drugs. Some items may interact with your medicine. What should I watch for while using this medication? Visit your care team for regular checks on your progress. Tell your care team if your symptoms do not start to get better or if they get worse. This medication can cause a serious condition in which there is too much acid in the blood. If you develop nausea, vomiting, stomach pain, unusual tiredness, or breathing problems, stop taking this medication and call your care team right away. If possible, use a ketone dipstick to check for ketones in your urine. Check with your care team if you have severe diarrhea, nausea, and vomiting, or if you sweat a lot. The loss of too much body fluid may make it dangerous for you to take this medication. A test called the HbA1C (A1C) will be monitored. This is a simple blood test. It measures your blood sugar control over the last 2 to 3 months. You will receive this test every 3 to 6 months. Learn how to check your blood sugar. Learn the symptoms  of low and high blood sugar and how to manage them. Always carry a quick-source of sugar with you in case you have symptoms of low blood sugar. Examples include hard sugar candy or glucose tablets. Make sure others know that you can choke if you eat or drink when you develop serious symptoms of low blood sugar, such as seizures or unconsciousness. Get medical help at once. Tell your care team if you have high blood sugar. You might need to change the dose of your medication. If you are sick or exercising more than usual, you may need to change the dose of your medication. Do not skip meals. Ask your care team if you should avoid alcohol. Many nonprescription cough and cold products contain sugar or alcohol. These can affect blood sugar. Wear a medical ID bracelet or chain. Carry a card that describes your condition. List the medications and doses you take on the card. What side effects may I notice from receiving this medication? Side effects that you should report to your care team as soon as possible: Allergic reactions--skin  rash, itching, hives, swelling of the face, lips, tongue, or throat Dehydration--increased thirst, dry mouth, feeling faint or lightheaded, headache, dark yellow or brown urine Diabetic ketoacidosis (DKA)--increased thirst or amount of urine, dry mouth, fatigue, fruity odor to breath, trouble breathing, stomach pain, nausea, vomiting Genital yeast infection--redness, swelling, pain, or itchiness, odor, thick or lumpy discharge New pain or tenderness, change in skin color, sores or ulcers, infection of the leg or foot Infection or redness, swelling, tenderness, or pain in the genitals, or area from the genitals to the back of the rectum Urinary tract infection (UTI)--burning when passing urine, passing frequent small amounts of urine, bloody or cloudy urine, pain in the lower back or sides This list may not describe all possible side effects. Call your doctor for medical advice  about side effects. You may report side effects to FDA at 1-800-FDA-1088. Where should I keep my medication? Keep out of the reach of children and pets. Store at room temperature between 20 and 25 degrees C (68 and 77 degrees F). Get rid of any unused medication after the expiration date. To get rid of medications that are no longer needed or have expired: Take the medication to a medication take-back program. Check with your pharmacy or law enforcement to find a location. If you cannot return the medication, check the label or package insert to see if the medication should be thrown out in the garbage or flushed down the toilet. If you are not sure, ask your care team. If it is safe to put it in the trash, take the medication out of the container. Mix the medication with cat litter, dirt, coffee grounds, or other unwanted substance. Seal the mixture in a bag or container. Put it in the trash. NOTE: This sheet is a summary. It may not cover all possible information. If you have questions about this medicine, talk to your doctor, pharmacist, or health care provider.  2023 Elsevier/Gold Standard (2021-04-06 00:00:00)   Heart Failure, Diagnosis  Heart failure is a condition in which the heart has trouble pumping blood. This may mean that the heart cannot pump enough blood out to the body or that the heart does not fill up with enough blood. For some people with heart failure, fluid may back up into the lungs. There may also be swelling (edema) in the lower legs. Heart failure is usually a long-term (chronic) condition. It is important for you to take good care of yourself and follow the treatment plan from your health care provider. Different stages of heart failure have different treatment plans. The stages are: Stage A: At risk for heart failure. Having no symptoms of heart failure, but being at risk for developing heart failure. Stage B: Pre-heart failure. Having no symptoms of heart failure, but  having structural changes to the heart that indicate heart failure. Stage C: Symptomatic heart failure. Having symptoms of heart failure in addition to structural changes to the heart that indicate heart failure. Stage D: Advanced heart failure. Having symptoms that interfere with daily life and frequent hospitalizations related to heart failure. What are the causes? This condition may be caused by: High blood pressure (hypertension). Hypertension causes the heart muscle to work harder than normal. Coronary artery disease, or CAD. CAD is the buildup of cholesterol and fat (plaque) in the arteries of the heart. Heart attack, also called myocardial infarction. This injures the heart muscle, making it hard for the heart to pump blood. Abnormal heart valves. The valves do not open  and close properly, forcing the heart to pump harder to keep the blood flowing. Heart muscle disease, inflammation, or infection (cardiomyopathy or myocarditis). This is damage to the heart muscle. It can increase the risk of heart failure. Lung disease. The heart works harder when the lungs are not healthy. What increases the risk? The risk of heart failure increases as a person ages. This condition is also more likely to develop in people who: Are obese. Use tobacco or nicotine products. Abuse alcohol or drugs. Have taken medicines that can damage the heart, such as chemotherapy drugs. Have any of these conditions: Diabetes. Abnormal heart rhythms. Thyroid problems. Low blood counts (anemia). Chronic kidney disease. Have a family history of heart failure. What are the signs or symptoms? Symptoms of this condition include: Shortness of breath with activity, such as when climbing stairs. A cough that does not go away. Swelling of the feet, ankles, legs, or abdomen. Losing or gaining weight for no reason. Trouble breathing when lying flat. Waking from sleep because of the need to sit up and get more air. Rapid  heartbeat. Other symptoms may include: Tiredness (fatigue) and loss of energy. Feeling light-headed, dizzy, or close to fainting. Nausea or loss of appetite. Waking up more often during the night to urinate (nocturia). Confusion. How is this diagnosed? This condition is diagnosed based on: Your medical history, symptoms, and a physical exam. Blood tests. Diagnostic tests, which may include: Echocardiogram. Electrocardiogram (ECG). Chest X-ray. Exercise stress test. Cardiac MRI. Cardiac catheterization and angiogram. Radionuclide scans. How is this treated? Treatment for this condition is aimed at managing the symptoms of heart failure. Medicines Treatment may include medicines that: Help lower blood pressure by relaxing (dilating) the blood vessels. These medicines are called ACE inhibitors (angiotensin-converting enzyme), ARBs (angiotensin receptor blockers), or vasodilators. Cause the kidneys to remove salt and water from the blood through urination (diuretics). Improve heart muscle strength and prevent the heart from beating too fast (beta blockers). Increase the force of the heartbeat (digoxin). Lower heart rates. Certain diabetes medicines (SGLT-2 inhibitors) may also be used in treatment. Healthy behavior changes Treatment may also include making healthy lifestyle changes, such as: Reaching and staying at a healthy weight. Not using tobacco or nicotine products. Eating heart-healthy foods. Limiting or avoiding alcohol. Stopping the use of illegal drugs. Being physically active. Participating in a cardiac rehabilitation program, which is a treatment program to improve your health and well-being through exercise training, education, and counseling. Other treatments Other treatments may include: Procedures to open blocked arteries or repair damaged valves. Placing a pacemaker to improve heart function (cardiac resynchronization therapy). Placing a device to treat serious  abnormal heart rhythms (implantable cardioverter defibrillator, or ICD). Placing a device to improve the pumping ability of the heart (left ventricular assist device, or LVAD). Receiving a healthy heart from a donor (heart transplant). This is done when other treatments have not helped. Follow these instructions at home: Manage other health conditions as told by your health care provider. These may include hypertension, diabetes, thyroid disease, or abnormal heart rhythms. Get ongoing education and support as needed. Learn as much as you can about heart failure. Keep all follow-up visits. This is important. Where to find more information American Heart Association: www.heart.org Centers for Disease Control and Prevention: http://www.wolf.info/ NIH National Institute on Aging: http://kim-miller.com/ Summary Heart failure is a condition in which the heart has trouble pumping blood. This condition is commonly caused by high blood pressure and other diseases of the heart and  lungs. Symptoms of this condition include shortness of breath, tiredness (fatigue), nausea, and swelling of the feet, ankles, legs, or abdomen. Treatments for this condition may include medicines, lifestyle changes, and surgery. Manage other health conditions as told by your health care provider. This information is not intended to replace advice given to you by your health care provider. Make sure you discuss any questions you have with your health care provider. Document Revised: 12/01/2020 Document Reviewed: 12/26/2019 Elsevier Patient Education  [place].   Heart Failure Eating Plan Heart failure, also called congestive heart failure, occurs when your heart does not pump blood well enough to meet your body's needs for oxygen-rich blood. Heart failure is a long-term (chronic) condition. Living with heart failure can be challenging. Following your health care provider's instructions about a healthy lifestyle and working with a  dietitian to choose the right foods may help to improve your symptoms. An eating plan for someone with heart failure will include changes that limit the intake of salt (sodium) and unhealthy fat. What are tips for following this plan? Reading food labels Check food labels for the amount of sodium per serving. Choose foods that have less than 140 mg (milligrams) of sodium in each serving. Check food labels for the number of calories per serving. This is important if you need to limit your daily calorie intake to lose weight. Check food labels for the serving size. If you eat more than one serving, you will be eating more sodium and calories than what is listed on the label. Look for foods that are labeled as "sodium-free," "very low sodium," or "low sodium." Foods labeled as "reduced sodium" or "lightly salted" may still have more sodium than what is recommended for you. Cooking Avoid adding salt when cooking. Ask your health care provider or dietitian before using salt substitutes. Season food with salt-free seasonings, spices, or herbs. Check the label of seasoning mixes to make sure they do not contain salt. Cook with heart-healthy oils, such as olive, canola, soybean, or sunflower oil. Do not fry foods. Cook foods using low-fat methods, such as baking, boiling, grilling, and broiling. Limit unhealthy fats when cooking by: Removing the skin from poultry, such as chicken. Removing all visible fats from meats. Skimming the fat off from stews, soups, and gravies before serving them. Meal planning  Limit your intake of: Processed, canned, or prepackaged foods. Foods that are high in trans fat, such as fried foods. Sweets, desserts, sugary drinks, and other foods with added sugar. Full-fat dairy products, such as whole milk. Eat a balanced diet. This may include: 4-5 servings of fruit each day and 4-5 servings of vegetables each day. At each meal, try to fill one-half of your plate with fruits  and vegetables. Up to 6-8 servings of whole grains each day. Up to 2 servings of lean meat, poultry, or fish each day. One serving of meat is equal to 3 oz (85 g). This is about the same size as a deck of cards. 2 servings of low-fat dairy each day. Heart-healthy fats. Healthy fats called omega-3 fatty acids are found in foods such as flaxseed and cold-water fish like sardines, salmon, and mackerel. Aim to eat 25-35 g (grams) of fiber a day. Foods that are high in fiber include apples, broccoli, carrots, beans, peas, and whole grains. Do not add salt or condiments that contain salt (such as soy sauce) to foods before eating. When eating at a restaurant, ask that your food be prepared with  less salt or no salt, if possible. Try to eat 2 or more vegetarian meals each week. Eat more home-cooked food and eat less restaurant, buffet, and fast food. General information Do not eat more than 2,300 mg of sodium a day. The amount of sodium that is recommended for you may be lower, depending on your condition. Maintain a healthy body weight as directed. Ask your health care provider what a healthy weight is for you. Check your weight every day. Work with your health care provider and dietitian to make a plan that is right for you to lose weight or maintain your current weight. Limit how much fluid you drink. Ask your health care provider or dietitian how much fluid you can have each day. Limit or avoid alcohol as told by your health care provider or dietitian. Recommended foods Fruits All fresh, frozen, and canned fruits. Dried fruits, such as raisins, prunes, and cranberries. Vegetables All fresh vegetables. Vegetables that are frozen without sauce or added salt. Low-sodium or sodium-free canned vegetables. Grains Bread with less than 80 mg of sodium per slice. Whole-wheat pasta, quinoa, and brown rice. Oats and oatmeal. Barley. Millet. Grits and cream of wheat. Whole-grain and whole-wheat cold  cereal. Meats and other protein foods Lean cuts of meat. Skinless chicken and [place]. Fish with high omega-3 fatty acids, such as salmon, sardines, and other cold-water fishes. Eggs. Dried beans, peas, and edamame. Unsalted nuts and nut butters. Dairy Low-fat or nonfat (skim) milk and dried milk. Rice milk, soy milk, and almond milk. Low-fat or nonfat yogurt. Small amounts of reduced-sodium block cheese. Low-sodium cottage cheese. Fats and oils Olive, canola, soybean, flaxseed, avocado, or sunflower oil. Sweets and desserts Applesauce. Granola bars. Sugar-free pudding and gelatin. Frozen fruit bars. Seasoning and other foods Fresh and dried herbs. Lemon or lime juice. Vinegar. Low-sodium ketchup. Salt-free marinades, salad dressings, sauces, and seasonings. The items listed above may not be a complete list of foods and beverages you can eat. Contact a dietitian for more information. Foods to avoid Fruits Fruits that are dried with sodium-containing preservatives. Vegetables Canned vegetables. Frozen vegetables with sauce or seasonings. Creamed vegetables. [place] fries. Onion rings. Pickled vegetables and sauerkraut. Grains Bread with more than 80 mg of sodium per slice. Hot or cold cereal with more than 140 mg sodium per serving. Salted pretzels and crackers. Prepackaged breadcrumbs. Bagels, croissants, and biscuits. Meats and other protein foods Ribs and chicken wings. Bacon, ham, pepperoni, bologna, salami, and packaged luncheon meats. Hot dogs, bratwurst, and sausage. Canned meat. Smoked meat and fish. Salted nuts and seeds. Dairy Whole milk, half-and-half, and cream. Buttermilk. Processed cheese, cheese spreads, and cheese curds. Regular cottage cheese. Feta cheese. Shredded cheese. String cheese. Fats and oils Butter, lard, shortening, ghee, and bacon fat. Canned and packaged gravies. Seasoning and other foods Onion salt, garlic salt, table salt, and sea salt. Marinades. Regular salad  dressings. Relishes, pickles, and olives. Meat flavorings and tenderizers, and bouillon cubes. Horseradish, ketchup, and mustard. Worcestershire sauce. Teriyaki sauce, soy sauce (including reduced sodium). Hot sauce and Tabasco sauce. Steak sauce, fish sauce, oyster sauce, and cocktail sauce. Taco seasonings. Barbecue sauce. Tartar sauce. The items listed above may not be a complete list of foods and beverages you should avoid. Contact a dietitian for more information. Summary A heart failure eating plan includes changes that limit your intake of sodium and unhealthy fat, and it may help you lose weight or maintain a healthy weight. Your health care provider may also recommend limiting  how much fluid you drink. Most people with heart failure should eat no more than 2,300 mg of salt (sodium) a day. The amount of sodium that is recommended for you may be lower, depending on your condition. Contact your health care provider or dietitian before making any major changes to your diet. This information is not intended to replace advice given to you by your health care provider. Make sure you discuss any questions you have with your health care provider. Document Revised: 01/18/2020 Document Reviewed: 01/18/2020 Elsevier Patient Education  [place]

## 2022-01-02 NOTE — Progress Notes (Signed)
Cardiology Clinic Note   Patient Name: Evelyn Dunn Date of Encounter: 01/04/2022  Primary Care Provider:  Center, [facility] Primary Cardiologist:  Yvonne Kendall, MD  Patient Profile    71 year old female with a history of hypertension, CKD III, HFrEF, nonischemic cardiomyopathy, ICD, moderate to severe MR, LBBB, tobacco use,  who is here for follow up on her nonischemic cardiomyopathy.   Past Medical History    Past Medical History:  Diagnosis Date   Chronic HFrEF (heart failure with reduced ejection fraction) (HCC)    a. 11/2020 Echo: EF 20-25%, glob HK. Nl RV size/fxn, sev BAE. Mod-sev MR. Mild AI; b. 11/2020 Cath: CO/CI 3.6/1.9 respectively.   CKD (chronic kidney disease), stage III (HCC)    Hypertension    a. 11/2020 Renal Duplex: No high grade RAS.   LBBB (left bundle branch block)    Moderate to severe mitral regurgitation    NICM (nonischemic cardiomyopathy) (HCC)    a. 11/2020 Echo: EF 20-25%, glob HK; b. 11/2020 Cath: nonobs dzs.   Nonobstructive CAD (coronary artery disease)    a. 11/2020 Cath: LM nl, LAD 25p, 65/85m, D2 30, LCX 68m, OM1 mod dzs, RCA mild diff dzs, RPDA 20-->Med rx.   Past Surgical History:  Procedure Laterality Date   BIV ICD INSERTION CRT-D N/A 10/16/2021   Procedure: BIV ICD INSERTION CRT-D;  Surgeon: Lanier Prude, MD;  Location: [facility] INVASIVE CV LAB;  Service: Cardiovascular;  Laterality: N/A;   RIGHT/LEFT HEART CATH AND CORONARY ANGIOGRAPHY N/A 12/05/2020   Procedure: RIGHT/LEFT HEART CATH AND CORONARY ANGIOGRAPHY;  Surgeon: Yvonne Kendall, MD;  Location: [facility] INVASIVE CV LAB;  Service: Cardiovascular;  Laterality: N/A;    Allergies  No Known Allergies  History of Present Illness    71 year old female with a complex medical history history of hypertension, CKD stage III, nonischemic cardiomyopathy, HFrEF, moderate to severe MR, LBBB, tobacco use, and recent CRT-D device implanted 10/16/2021.   She originally presented to  [facility] on 11/2020 with complaint dyspnea and hypertensive urgency.  Echocardiogram revealed EF of 20-25%, global hypokinesis with moderate to severe MR.  EKG revealed a new LBBB  and cardiac work-up was negative for ACS.  She was initially trialed on diuretic therapy however developed hypotension and a creatinine increased to 2.75.  R/LHC was performed by Dr. Okey Dupre on 6/20 which revealed moderate to severe single-vessel disease with 20 to 30% proximal and tandem 50 to 70% mid LAD stenosis, mild disease involving the LCx and RCA.  Severity of cardiomyopathy is out of proportion to degree of coronary artery disease suggestive primarily of nonischemic etiology.  She was diuresed and optimized on goal-directed medical therapy prior to discharge with Entresto, Toprol, and BiDil.  She presented for hospital follow-up in 12/2020 with dyspnea and hypertensive urgency.  She was euvolemic on exam and tachycardic.  She had intermittently been following with Clarisa Kindred, NP at heart failure clinic.  In September 2022 she reported doing well and was started on Farxiga by the heart failure clinic and was limited to titrating other GDMT due to CKD.  On 07/2021 she was referred to Dr. Lalla Brothers for ICD implant.  In April 2023 she was followed up in clinic by Dr. Okey Dupre where she had reported she had stopped all GDMT due to the cost.  At which point time she was encouraged to restart her Sherryll Burger and Marcelline Deist and was started on aspirin with the addition of rosuvastatin and carvedilol to her medication  regimen.  In May 2023 she underwent successful implantation of a Chemical engineer CRT-D implant by Dr. Quentin Ore.  She returns to clinic today feeling well without any complaints. She has had no issues since her device was implanted. Denies any chest pain, shortness of breath, dyspnea, palpitations, or peripheral edema. She states she has been compliant with her medication regimen. Denies any recent hospitalizations or visits to the emergency  department.   Home Medications    Current Outpatient Medications  Medication Sig Dispense Refill   aspirin 81 MG chewable tablet Chew 1 tablet (81 mg total) by mouth daily. 90 tablet 0   carvedilol (COREG) 3.125 MG tablet Take 1 tablet (3.125 mg total) by mouth 2 (two) times daily. 60 tablet 3   diphenhydrAMINE (BENADRYL) 25 MG tablet Take 50 mg by mouth at bedtime as needed for allergies or sleep.     famotidine (PEPCID) 20 MG tablet Take 40 mg by mouth daily.     rosuvastatin (CRESTOR) 10 MG tablet Take 1 tablet (10 mg total) by mouth daily. 30 tablet 3   sacubitril-valsartan (ENTRESTO) 24-26 MG Take 1 tablet by mouth 2 (two) times daily. 60 tablet 3   acetaminophen (TYLENOL) 325 MG tablet Take 1-2 tablets (325-650 mg total) by mouth every 4 (four) hours as needed for mild pain. (Patient not taking: Reported on 01/04/2022)     No current facility-administered medications for this visit.     Family History    Family History  Problem Relation Age of Onset   Valvular heart disease Mother    Heart failure Father    Coronary artery disease Sister    Vascular Disease Brother    She indicated that the status of her mother is unknown. She indicated that the status of her father is unknown. She indicated that the status of her sister is unknown. She indicated that the status of her brother is unknown.  Social History    Social History   Socioeconomic History   Marital status: Widowed    Spouse name: Not on file   Number of children: Not on file   Years of education: Not on file   Highest education level: Not on file  Occupational History   Not on file  Tobacco Use   Smoking status: Every Day    Packs/day: 0.25    Types: Cigarettes   Smokeless tobacco: Never  Vaping Use   Vaping Use: Never used  Substance and Sexual Activity   Alcohol use: Not Currently    Alcohol/week: 1.0 standard drink of alcohol    Types: 1 Cans of beer per week    Comment: 1 beer every evening   Drug  use: Never   Sexual activity: Not on file  Other Topics Concern   Not on file  Social History Narrative   Not on file   Social Determinants of Health   Financial Resource Strain: Not on file  Food Insecurity: Not on file  Transportation Needs: Not on file  Physical Activity: Not on file  Stress: Not on file  Social Connections: Not on file  Intimate Partner Violence: Not on file     Review of Systems    General:  No chills, fever, night sweats or weight changes.  Cardiovascular:  No chest pain, dyspnea on exertion, edema, orthopnea, palpitations, paroxysmal nocturnal dyspnea. Dermatological: No rash, lesions/masses Respiratory: No cough, dyspnea Urologic: No hematuria, dysuria Abdominal:   No nausea, vomiting, diarrhea, bright red blood per rectum, melena, or hematemesis Neurologic:  No visual changes, wkns, changes in mental status. All other systems reviewed and are otherwise negative except as noted above.     Physical Exam    VS:  BP 130/66 (BP Location: Left Arm, Patient Position: Sitting, Cuff Size: Large)   Pulse 73   Ht 5\' 6"  (1.676 m)   Wt 208 lb 3.2 oz (94.4 kg)   SpO2 96%   BMI 33.60 kg/m  , BMI Body mass index is 33.6 kg/m.     GEN: Well nourished, well developed, in no acute distress. HEENT: normal. Neck: Supple, no JVD, carotid bruits, or masses. Cardiac: RRR, no murmurs, rubs, or gallops. No clubbing, cyanosis, edema.  Radials/DP/PT 2+ and equal bilaterally.  Respiratory:  Respirations regular and unlabored, clear to auscultation bilaterally. GI: Soft, nontender, nondistended, BS + x 4. MS: no deformity or atrophy. Skin: warm and dry, no rash. 4 inch left upper chest incision healed from BiV ICD insertion in May 2023 Neuro:  Strength and sensation are intact. Psych: Normal affect.  Accessory Clinical Findings    ECG personally reviewed by me today- atrial sensed and ventricular paced rhythm rate of 73, chronic left bundle branch block- No acute  changes  Lab Results  Component Value Date   WBC 4.0 10/02/2021   HGB 15.6 10/02/2021   HCT 44.1 10/02/2021   MCV 93 10/02/2021   PLT 205 10/02/2021   Lab Results  Component Value Date   CREATININE 1.28 (H) 11/03/2021   BUN 30 (H) 11/03/2021   NA 136 11/03/2021   K 4.0 11/03/2021   CL 107 11/03/2021   CO2 22 11/03/2021   Lab Results  Component Value Date   ALT 7 09/21/2021   AST 15 09/21/2021   ALKPHOS 47 09/21/2021   BILITOT 0.6 09/21/2021   Lab Results  Component Value Date   CHOL 162 11/25/2020   HDL 41 11/25/2020   LDLCALC 101 (H) 11/25/2020   TRIG 98 11/25/2020   CHOLHDL 4.0 11/25/2020    Lab Results  Component Value Date   HGBA1C 6.4 (H) 11/25/2020    Assessment & Plan   1.  HFrEF with LVEF 25-30% with recent BiV ICD placement. Remains euvolemic on exam today. Denies any shortness of breath, dyspnea, peripheral edema, or weight gain. Continue GDMT of coreg 3.125 mg bid, entresto 24/26 mg bid without increasing due due to elevated kidney function which has limited escalation.  Serum creatinine 1.28  on 11/03/21, noted improvement from previous result of 1.43. Will discuss adding farxiga on return. Encouraged to continue with daily weights and low sodium diet.  2. Coronary artery disease with R/LHC demonstrating moderate to severe single vessel disease with 20-30% proximal and tandem 50-70% mid LAD stenosis. Stable today without anginal symptoms on exam. Continue rosuvastatin, asa, and carvedilol as secondary prevention.   3. NICM/BiV ICD ([employer])placed 10/16/2021 by Dr 12/16/2021 without complications. Encouraged to keep follow up appointment with device clinic.  4. Hypertension with blood pressure today of 130/66. Continue with coreg 3.125 mg bid and entresto 24/26/mg bid. Continue to monitor blood pressure at home.  5. Obesity with BMI 33.6. Continue to increase activity.   6. Hyperlipidemia LDL 101 11/25/20 recheck lipid panel and continue rosuvastatin  10 mg daily, encouraged dietary changes  Disposition with return to clinic to see MD or APP in 3 months to follow up on blood pressure and continue to try and escalate GDMT as kidney allow and patient tolerates.  Merrianne Mccumbers, NP 01/04/2022, 4:00 PM

## 2022-01-04 ENCOUNTER — Encounter: Payer: Self-pay | Admitting: Physician Assistant

## 2022-01-04 ENCOUNTER — Ambulatory Visit (INDEPENDENT_AMBULATORY_CARE_PROVIDER_SITE_OTHER): Payer: Medicare PPO | Admitting: Cardiology

## 2022-01-04 VITALS — BP 130/66 | HR 73 | Ht 66.0 in | Wt 208.2 lb

## 2022-01-04 DIAGNOSIS — I5022 Chronic systolic (congestive) heart failure: Secondary | ICD-10-CM | POA: Diagnosis not present

## 2022-01-04 DIAGNOSIS — I428 Other cardiomyopathies: Secondary | ICD-10-CM

## 2022-01-04 DIAGNOSIS — I10 Essential (primary) hypertension: Secondary | ICD-10-CM

## 2022-01-04 DIAGNOSIS — E785 Hyperlipidemia, unspecified: Secondary | ICD-10-CM

## 2022-01-04 DIAGNOSIS — I2510 Atherosclerotic heart disease of native coronary artery without angina pectoris: Secondary | ICD-10-CM

## 2022-01-04 NOTE — Patient Instructions (Signed)
Medication Instructions:  No changes at this time.   *If you need a refill on your cardiac medications before your next appointment, please call your pharmacy*   Lab Work: None  If you have labs (blood work) drawn today and your tests are completely normal, you will receive your results only by: MyChart Message (if you have MyChart) OR A paper copy in the mail If you have any lab test that is abnormal or we need to change your treatment, we will call you to review the results.   Testing/Procedures: None   Follow-Up: At [facility], you and your health needs are our priority.  As part of our continuing mission to provide you with exceptional heart care, we have created designated Provider Care Teams.  These Care Teams include your primary Cardiologist (physician) and Advanced Practice Providers (APPs -  Physician Assistants and Nurse Practitioners) who all work together to provide you with the care you need, when you need it.  We recommend signing up for the patient portal called "MyChart".  Sign up information is provided on this After Visit Summary.  MyChart is used to connect with patients for Virtual Visits (Telemedicine).  Patients are able to view lab/test results, encounter notes, upcoming appointments, etc.  Non-urgent messages can be sent to your provider as well.   To learn more about what you can do with MyChart, go to [email or web address].    Your next appointment:   3 month(s)  The format for your next appointment:   In Person  Provider:   Yvonne Kendall, MD or Charlsie Quest NP {    Important Information About Sugar

## 2022-01-17 ENCOUNTER — Encounter: Payer: Medicare PPO | Admitting: Cardiology

## 2022-01-18 ENCOUNTER — Ambulatory Visit (INDEPENDENT_AMBULATORY_CARE_PROVIDER_SITE_OTHER): Payer: Medicare PPO

## 2022-01-18 DIAGNOSIS — I428 Other cardiomyopathies: Secondary | ICD-10-CM

## 2022-01-18 LAB — CUP PACEART REMOTE DEVICE CHECK
Battery Remaining Longevity: 102 mo
Battery Remaining Percentage: 100 %
Brady Statistic RA Percent Paced: 0 %
Brady Statistic RV Percent Paced: 0 %
Date Time Interrogation Session: 20230803005100
HighPow Impedance: 71 Ohm
Implantable Lead Implant Date: 20230501
Implantable Lead Implant Date: 20230501
Implantable Lead Implant Date: 20230501
Implantable Lead Location: 753858
Implantable Lead Location: 753859
Implantable Lead Location: 753860
Implantable Lead Model: 672
Implantable Lead Model: 7841
Implantable Lead Model: 7842
Implantable Lead Serial Number: 1169613
Implantable Lead Serial Number: 1263550
Implantable Lead Serial Number: 185133
Implantable Pulse Generator Implant Date: 20230501
Lead Channel Impedance Value: 648 Ohm
Lead Channel Impedance Value: 652 Ohm
Lead Channel Impedance Value: 673 Ohm
Lead Channel Pacing Threshold Amplitude: 0.4 V
Lead Channel Pacing Threshold Amplitude: 0.4 V
Lead Channel Pacing Threshold Amplitude: 0.7 V
Lead Channel Pacing Threshold Pulse Width: 0.4 ms
Lead Channel Pacing Threshold Pulse Width: 0.4 ms
Lead Channel Pacing Threshold Pulse Width: 0.4 ms
Lead Channel Setting Pacing Amplitude: 3.5 V
Lead Channel Setting Pacing Amplitude: 3.5 V
Lead Channel Setting Pacing Amplitude: 3.5 V
Lead Channel Setting Pacing Pulse Width: 0.4 ms
Lead Channel Setting Pacing Pulse Width: 0.4 ms
Lead Channel Setting Sensing Sensitivity: 0.5 mV
Lead Channel Setting Sensing Sensitivity: 1 mV
Pulse Gen Serial Number: 511069

## 2022-01-24 ENCOUNTER — Ambulatory Visit (INDEPENDENT_AMBULATORY_CARE_PROVIDER_SITE_OTHER): Payer: Medicare PPO | Admitting: Cardiology

## 2022-01-24 ENCOUNTER — Encounter: Payer: Self-pay | Admitting: Cardiology

## 2022-01-24 VITALS — BP 112/70 | HR 77 | Ht 66.0 in | Wt 210.1 lb

## 2022-01-24 DIAGNOSIS — Z95810 Presence of automatic (implantable) cardiac defibrillator: Secondary | ICD-10-CM | POA: Diagnosis not present

## 2022-01-24 DIAGNOSIS — I428 Other cardiomyopathies: Secondary | ICD-10-CM

## 2022-01-24 DIAGNOSIS — I5022 Chronic systolic (congestive) heart failure: Secondary | ICD-10-CM

## 2022-01-24 MED ORDER — SACUBITRIL-VALSARTAN 24-26 MG PO TABS: 1.0000 | ORAL_TABLET | Freq: Two times a day (BID) | ORAL | 3 refills | Status: DC

## 2022-01-24 MED ORDER — CARVEDILOL 3.125 MG PO TABS: 3.1250 mg | ORAL_TABLET | Freq: Two times a day (BID) | ORAL | 3 refills | Status: DC

## 2022-01-24 MED ORDER — ROSUVASTATIN CALCIUM 10 MG PO TABS
10.0000 mg | ORAL_TABLET | Freq: Every day | ORAL | 3 refills | Status: DC
Start: 2022-01-24 — End: 2022-08-06

## 2022-01-24 NOTE — Patient Instructions (Addendum)
Medication Instructions:  none *If you need a refill on your cardiac medications before your next appointment, please call your pharmacy*  Lab Work: none If you have labs (blood work) drawn today and your tests are completely normal, you will receive your results only by: MyChart Message (if you have MyChart) OR A paper copy in the mail If you have any lab test that is abnormal or we need to change your treatment, we will call you to review the results.  Testing/Procedures: Your physician has requested that you have an echocardiogram. Echocardiography is a painless test that uses sound waves to create images of your heart. It provides your doctor with information about the size and shape of your heart and how well your heart's chambers and valves are working. This procedure takes approximately one hour. There are no restrictions for this procedure.  Follow-Up: At [facility], you and your health needs are our priority.  As part of our continuing mission to provide you with exceptional heart care, we have created designated Provider Care Teams.  These Care Teams include your primary Cardiologist (physician) and Advanced Practice Providers (APPs -  Physician Assistants and Nurse Practitioners) who all work together to provide you with the care you need, when you need it.  We recommend signing up for the patient portal called "MyChart".  Sign up information is provided on this After Visit Summary.  MyChart is used to connect with patients for Virtual Visits (Telemedicine).  Patients are able to view lab/test results, encounter notes, upcoming appointments, etc.  Non-urgent messages can be sent to your provider as well.   To learn more about what you can do with MyChart, go to [email or web address].    Your next appointment:   1 year(s)  The format for your next appointment:   In Person  Provider:   Steffanie Dunn, MD    Other Instructions NA  Important Information About  Sugar

## 2022-01-24 NOTE — Progress Notes (Signed)
Electrophysiology Office Follow up Visit Note:    Date:  01/24/2022   ID:  Evelyn Dunn, DOB 02-13-1951, MRN [id number]  PCP:  [facility] Cardiologist:  Yvonne Kendall, MD  [facility] Electrophysiologist:  Lanier Prude, MD    Interval History:    Evelyn Dunn is a 71 y.o. female who presents for a follow up visit. She had a CRT-D implant 10/17/2021. Her LV lead is in the left bundle position due to tortuous CS anatomy. She has done well since implant.        Past Medical History:  Diagnosis Date   Chronic HFrEF (heart failure with reduced ejection fraction) (HCC)    a. 11/2020 Echo: EF 20-25%, glob HK. Nl RV size/fxn, sev BAE. Mod-sev MR. Mild AI; b. 11/2020 Cath: CO/CI 3.6/1.9 respectively.   CKD (chronic kidney disease), stage III (HCC)    Hypertension    a. 11/2020 Renal Duplex: No high grade RAS.   LBBB (left bundle branch block)    Moderate to severe mitral regurgitation    NICM (nonischemic cardiomyopathy) (HCC)    a. 11/2020 Echo: EF 20-25%, glob HK; b. 11/2020 Cath: nonobs dzs.   Nonobstructive CAD (coronary artery disease)    a. 11/2020 Cath: LM nl, LAD 25p, 65/53m, D2 30, LCX 4m, OM1 mod dzs, RCA mild diff dzs, RPDA 20-->Med rx.    Past Surgical History:  Procedure Laterality Date   BIV ICD INSERTION CRT-D N/A 10/16/2021   Procedure: BIV ICD INSERTION CRT-D;  Surgeon: Lanier Prude, MD;  Location: [facility] INVASIVE CV LAB;  Service: Cardiovascular;  Laterality: N/A;   RIGHT/LEFT HEART CATH AND CORONARY ANGIOGRAPHY N/A 12/05/2020   Procedure: RIGHT/LEFT HEART CATH AND CORONARY ANGIOGRAPHY;  Surgeon: Yvonne Kendall, MD;  Location: [facility] INVASIVE CV LAB;  Service: Cardiovascular;  Laterality: N/A;    Current Medications: Current Meds  Medication Sig   aspirin 81 MG chewable tablet Chew 1 tablet (81 mg total) by mouth daily.   carvedilol (COREG) 3.125 MG tablet Take 1 tablet (3.125 mg total) by mouth 2 (two) times daily.    diphenhydrAMINE (BENADRYL) 25 MG tablet Take 50 mg by mouth at bedtime as needed for allergies or sleep.   famotidine (PEPCID) 20 MG tablet Take 40 mg by mouth daily.   rosuvastatin (CRESTOR) 10 MG tablet Take 1 tablet (10 mg total) by mouth daily.   sacubitril-valsartan (ENTRESTO) 24-26 MG Take 1 tablet by mouth 2 (two) times daily.     Allergies:   Patient has no known allergies.   Social History   Socioeconomic History   Marital status: Widowed    Spouse name: Not on file   Number of children: Not on file   Years of education: Not on file   Highest education level: Not on file  Occupational History   Not on file  Tobacco Use   Smoking status: Every Day    Packs/day: 0.25    Types: Cigarettes   Smokeless tobacco: Never  Vaping Use   Vaping Use: Never used  Substance and Sexual Activity   Alcohol use: Not Currently    Alcohol/week: 1.0 standard drink of alcohol    Types: 1 Cans of beer per week    Comment: 1 beer every evening   Drug use: Never   Sexual activity: Not on file  Other Topics Concern   Not on file  Social History Narrative   Not on file   Social Determinants of Health  Financial Resource Strain: Not on file  Food Insecurity: Not on file  Transportation Needs: Not on file  Physical Activity: Not on file  Stress: Not on file  Social Connections: Not on file     Family History: The patient's family history includes Coronary artery disease in her sister; Heart failure in her father; Valvular heart disease in her mother; Vascular Disease in her brother.  ROS:   Please see the history of present illness.    All other systems reviewed and are negative.  EKGs/Labs/Other Studies Reviewed:    The following studies were reviewed today:  01/24/2022 in clinic device interrogation personally reviewed Battery longevity 11 years Presenting rhythm AsBiVp Underlying rhythm AsVs. Lead parameters stable I checked multiple pacing configurations today with 12  lead ECG analysis without significant change to QRS duration. I chose LV only pacing in a bipolar configuration to maximize battery longevity. Turned LV sensing back on today.  EKG:  The ekg ordered today demonstrates AsVp. QRS duration 150-136ms.   Recent Labs: 09/21/2021: ALT 7 10/02/2021: Hemoglobin 15.6; Platelets 205 11/03/2021: BUN 30; Creatinine, Ser 1.28; Potassium 4.0; Sodium 136  Recent Lipid Panel    Component Value Date/Time   CHOL 162 11/25/2020 1053   TRIG 98 11/25/2020 1053   HDL 41 11/25/2020 1053   CHOLHDL 4.0 11/25/2020 1053   VLDL 20 11/25/2020 1053   LDLCALC 101 (H) 11/25/2020 1053    Physical Exam:    VS:  BP 112/70 (BP Location: Left Arm, Patient Position: Sitting, Cuff Size: Large)   Pulse 77   Ht 5\' 6"  (1.676 m)   Wt 210 lb 2 oz (95.3 kg)   SpO2 98%   BMI 33.92 kg/m     Wt Readings from Last 3 Encounters:  01/24/22 210 lb 2 oz (95.3 kg)  01/04/22 208 lb 3.2 oz (94.4 kg)  11/03/21 207 lb (93.9 kg)     GEN:  Well nourished, well developed in no acute distress. Obese. HEENT: Normal NECK: No JVD; No carotid bruits LYMPHATICS: No lymphadenopathy CARDIAC: RRR, no murmurs, rubs, gallops. CRT-D pocket well healed. RESPIRATORY:  Clear to auscultation without rales, wheezing or rhonchi  ABDOMEN: Soft, non-tender, non-distended MUSCULOSKELETAL:  No edema; No deformity  SKIN: Warm and dry NEUROLOGIC:  Alert and oriented x 3 PSYCHIATRIC:  Normal affect        ASSESSMENT:    1. Chronic HFrEF (heart failure with reduced ejection fraction) (HCC)   2. Non-ischemic cardiomyopathy (HCC)   3. Cardiac resynchronization therapy defibrillator (CRT-D) in place    PLAN:    In order of problems listed above:   #Chronic systolic heart failure #CRT-D in sut NYHA II. Warm and dry. S/p CRT-D implant 10/16/2021 with a left bundle area lead due to CS tortuosity. Continue remote monitoring. Continue current medications including entresto.  Update echo  today.   Follow-up 1 year or sooner as needed.  APP appointment okay.  Medication Adjustments/Labs and Tests Ordered: Current medicines are reviewed at length with the patient today.  Concerns regarding medicines are outlined above.  No orders of the defined types were placed in this encounter.  No orders of the defined types were placed in this encounter.    Signed, 12/16/2021, MD, [facility], [facility] 01/24/2022 3:14 PM    Electrophysiology [facility]

## 2022-02-05 ENCOUNTER — Telehealth: Payer: Self-pay

## 2022-02-05 DIAGNOSIS — T82110A Breakdown (mechanical) of cardiac electrode, initial encounter: Secondary | ICD-10-CM

## 2022-02-05 NOTE — Telephone Encounter (Signed)
Cv Remote Solution Alert received for decrease in LV impedance since 8/9 OV. I do not see anything scanned into Epic to note if this was noted at her recent OV. I do note in your notes "Her LV lead is in the left bundle position due to tortuous CS anatomy."  Routing to verify if anything needs to be addressed?

## 2022-02-08 NOTE — Addendum Note (Signed)
Addended by: Unk Lightning T on: 02/08/2022 12:21 PM   Modules accepted: Orders

## 2022-02-08 NOTE — Progress Notes (Signed)
Remote ICD transmission.   

## 2022-02-08 NOTE — Telephone Encounter (Signed)
Patient called made aware of cxr order.  Location requested by pt.

## 2022-02-20 ENCOUNTER — Ambulatory Visit
Admission: RE | Admit: 2022-02-20 | Discharge: 2022-02-20 | Disposition: A | Discharge status: Home / Self Care | Payer: Medicare PPO | Attending: Cardiology | Admitting: Cardiology

## 2022-02-20 ENCOUNTER — Ambulatory Visit
Admission: RE | Admit: 2022-02-20 | Discharge: 2022-02-20 | Disposition: A | Discharge status: Home / Self Care | Payer: Medicare PPO | Source: Ambulatory Visit | Attending: Cardiology | Admitting: Cardiology

## 2022-02-20 DIAGNOSIS — Z95810 Presence of automatic (implantable) cardiac defibrillator: Secondary | ICD-10-CM | POA: Insufficient documentation

## 2022-02-20 DIAGNOSIS — Y828 Other medical devices associated with adverse incidents: Secondary | ICD-10-CM | POA: Insufficient documentation

## 2022-02-20 DIAGNOSIS — T82110A Breakdown (mechanical) of cardiac electrode, initial encounter: Secondary | ICD-10-CM | POA: Insufficient documentation

## 2022-02-27 ENCOUNTER — Ambulatory Visit: Payer: Medicare PPO | Attending: Cardiology

## 2022-02-27 DIAGNOSIS — I5022 Chronic systolic (congestive) heart failure: Secondary | ICD-10-CM

## 2022-02-27 DIAGNOSIS — Z95810 Presence of automatic (implantable) cardiac defibrillator: Secondary | ICD-10-CM | POA: Diagnosis not present

## 2022-02-27 DIAGNOSIS — I428 Other cardiomyopathies: Secondary | ICD-10-CM

## 2022-02-27 LAB — ECHOCARDIOGRAM COMPLETE
AR max vel: 1.91 cm2
AV Area VTI: 2 cm2
AV Area mean vel: 2.16 cm2
AV Mean grad: 3 mmHg
AV Peak grad: 7.5 mmHg
AV Vena cont: 0.45 cm
Ao pk vel: 1.37 m/s
Area-P 1/2: 7.51 cm2
Calc EF: 53.1 %
P 1/2 time: 685 msec
S' Lateral: 5.1 cm
Single Plane A2C EF: 46.6 %
Single Plane A4C EF: 55.8 %

## 2022-03-19 IMAGING — US US EXTREM LOW VENOUS
1 series · 13 of 24 positions shown · non-contrast
Comparison: None.

CLINICAL DATA: 69-year-old with abnormal perfusion on nuclear
medicine examination. Findings raise concern for pulmonary embolism.
Evaluate for lower extremity DVT.



[Series 1: us venous img lower bilat (dvt) · portal-venous · 13 of 54 slices shown]
[im 1/54]
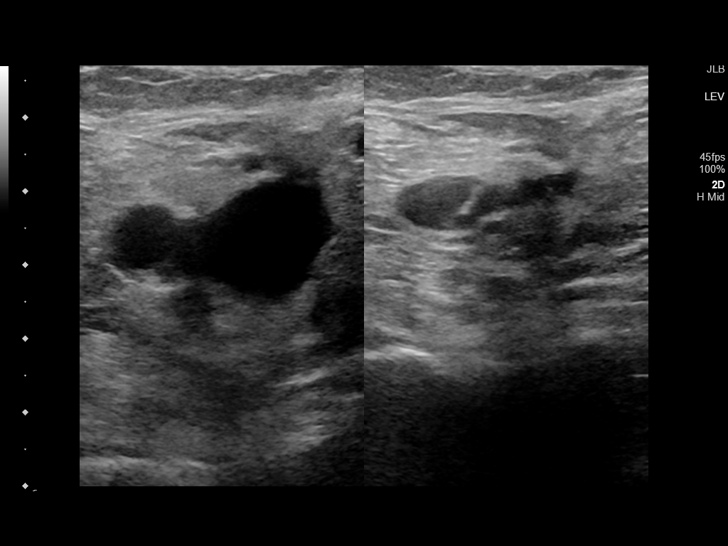
[im 5/54]
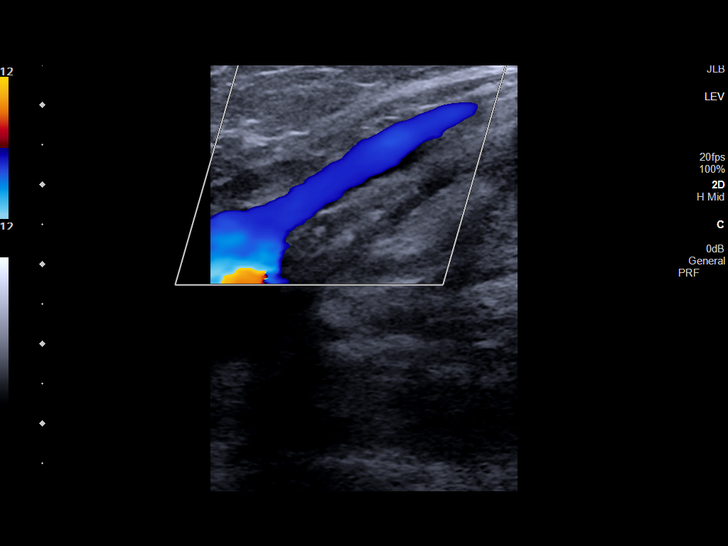
[im 10/54]
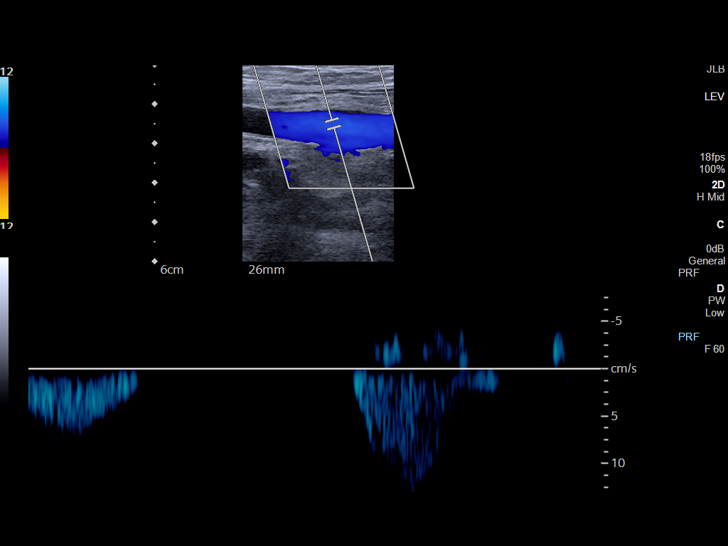
[im 14/54]
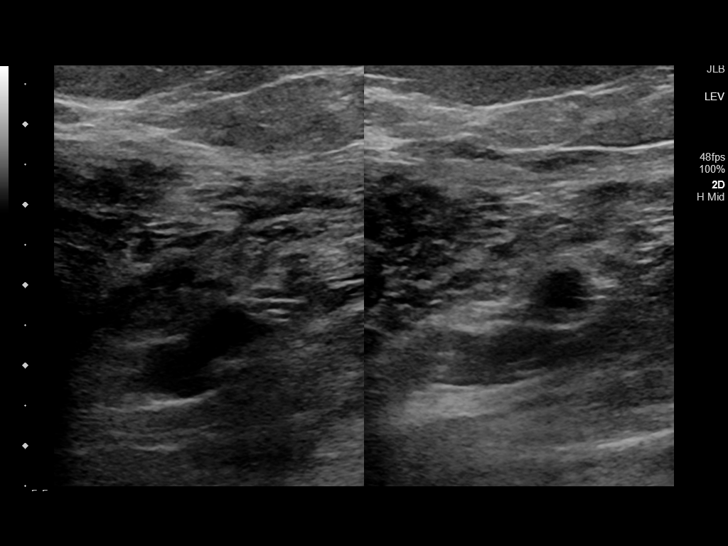
[im 19/54]
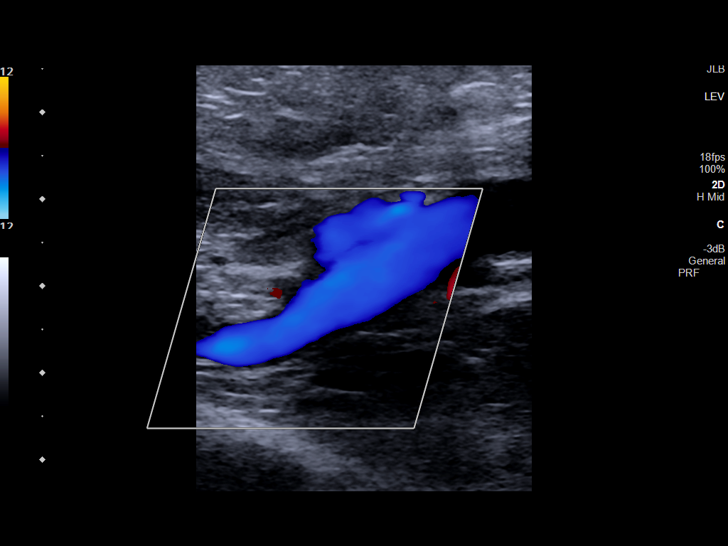
[im 24/54]
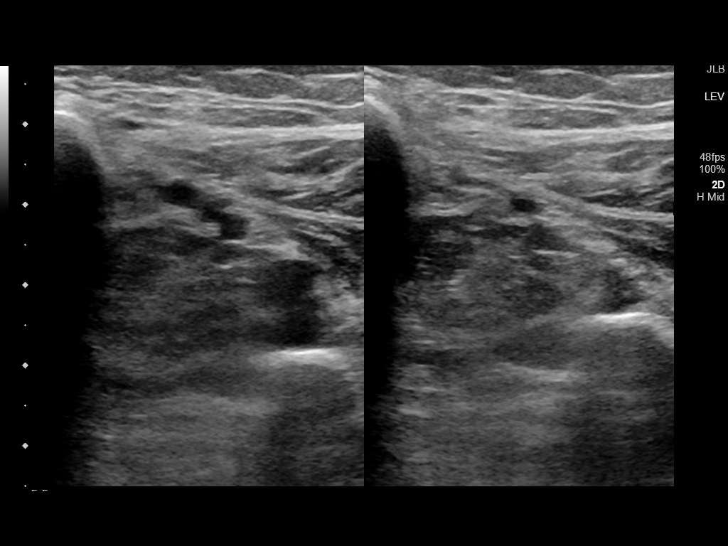
[im 28/54]
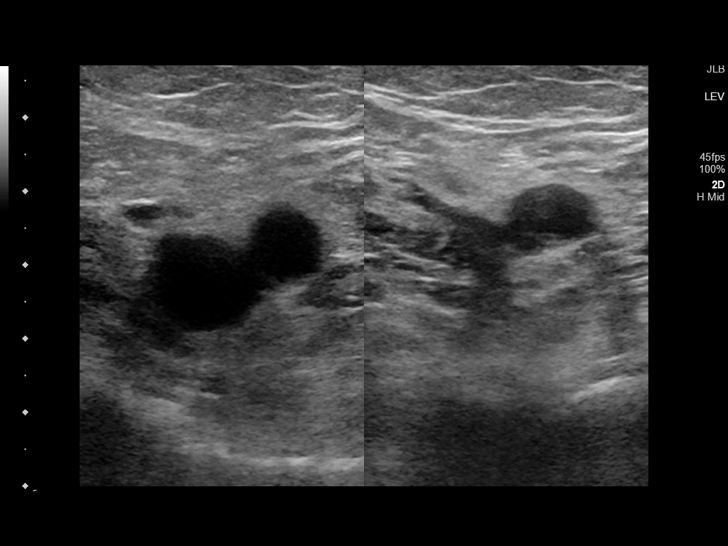
[im 30/54]
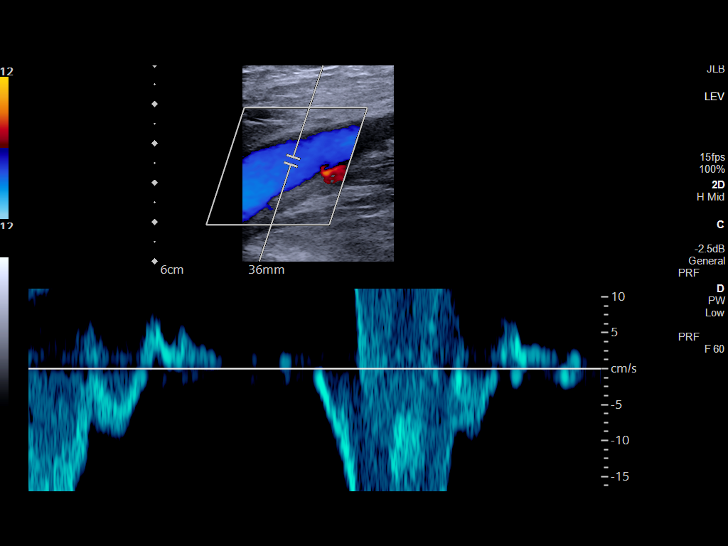
[im 35/54]
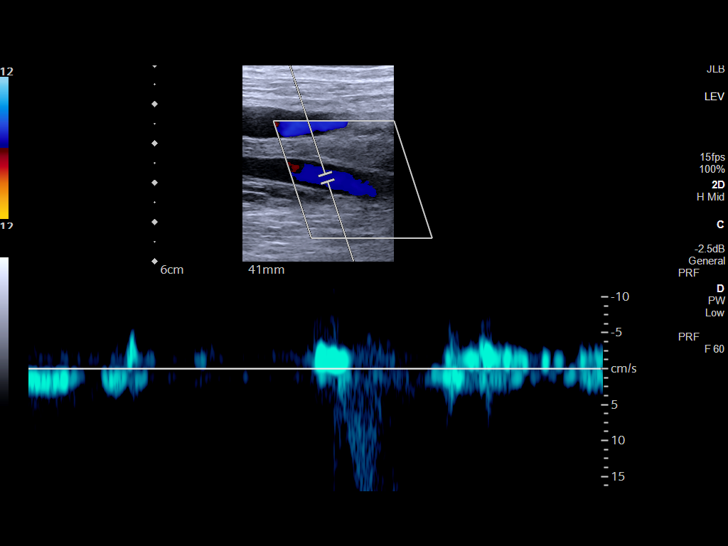
[im 40/54]
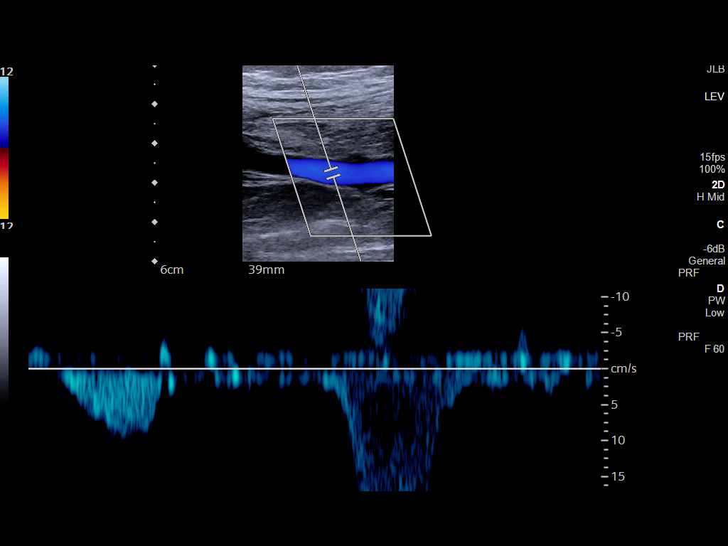
[im 44/54]
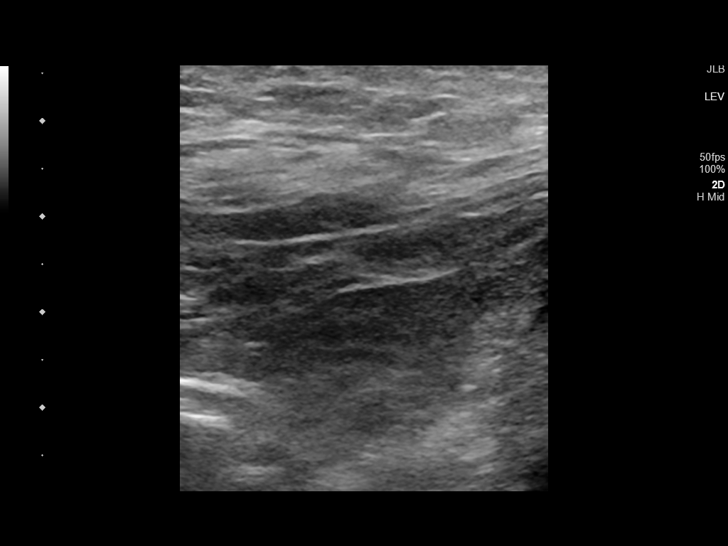
[im 49/54]
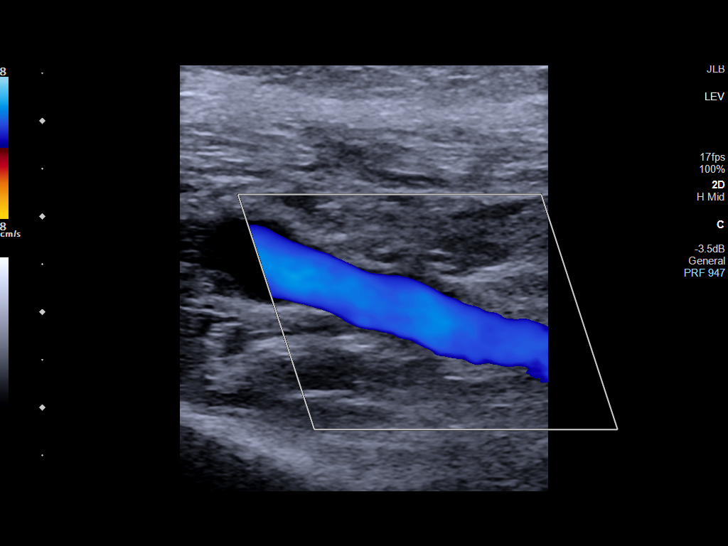
[im 54/54]
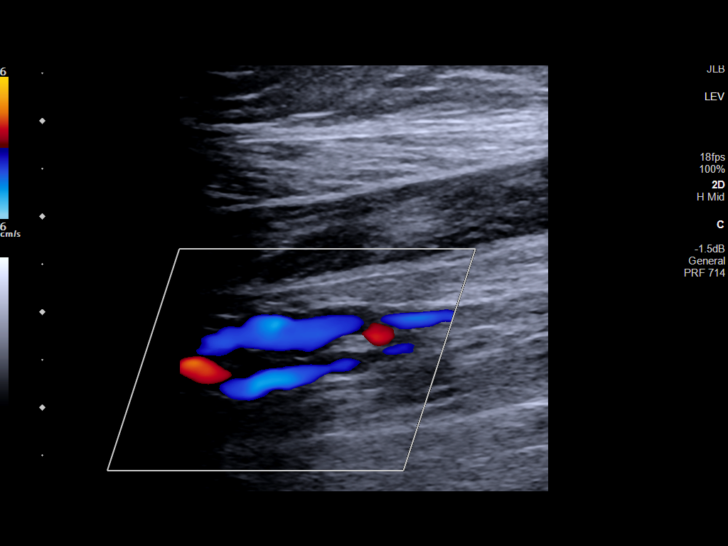

[13 of 24 positions shown; findings below may reference images not displayed]

FINDINGS: RIGHT LOWER EXTREMITY

Common Femoral Vein: No evidence of thrombus. Normal
compressibility, respiratory phasicity and response to augmentation.

Saphenofemoral Junction: No evidence of thrombus. Normal
compressibility and flow on color Doppler imaging.

Profunda Femoral Vein: No evidence of thrombus. Normal
compressibility and flow on color Doppler imaging.

Femoral Vein: No evidence of thrombus. Normal compressibility,
respiratory phasicity and response to augmentation.

Popliteal Vein: No evidence of thrombus. Normal compressibility,
respiratory phasicity and response to augmentation.

Calf Veins: No evidence of thrombus. Normal compressibility and flow
on color Doppler imaging.

Other Findings:  None.

LEFT LOWER EXTREMITY

Common Femoral Vein: No evidence of thrombus. Normal
compressibility, respiratory phasicity and response to augmentation.

Saphenofemoral Junction: No evidence of thrombus. Normal
compressibility and flow on color Doppler imaging.

Profunda Femoral Vein: No evidence of thrombus. Normal
compressibility and flow on color Doppler imaging.

Femoral Vein: No evidence of thrombus. Normal compressibility,
respiratory phasicity and response to augmentation.

Popliteal Vein: No evidence of thrombus. Normal compressibility,
respiratory phasicity and response to augmentation.

Calf Veins: No evidence of thrombus. Normal compressibility and flow
on color Doppler imaging.

Other Findings:  None.
IMPRESSION: No evidence of deep venous thrombosis in either lower extremity.

## 2022-04-10 ENCOUNTER — Other Ambulatory Visit: Admission: RE | Admit: 2022-04-10 | Payer: Medicare PPO | Source: Home / Self Care

## 2022-04-10 ENCOUNTER — Encounter: Payer: Self-pay | Admitting: Cardiology

## 2022-04-10 ENCOUNTER — Ambulatory Visit: Payer: Medicare PPO | Attending: Cardiology | Admitting: Cardiology

## 2022-04-10 VITALS — BP 124/82 | HR 90 | Ht 66.0 in | Wt 214.0 lb

## 2022-04-10 DIAGNOSIS — I5022 Chronic systolic (congestive) heart failure: Secondary | ICD-10-CM | POA: Diagnosis not present

## 2022-04-10 DIAGNOSIS — I2510 Atherosclerotic heart disease of native coronary artery without angina pectoris: Secondary | ICD-10-CM

## 2022-04-10 DIAGNOSIS — E785 Hyperlipidemia, unspecified: Secondary | ICD-10-CM

## 2022-04-10 DIAGNOSIS — I428 Other cardiomyopathies: Secondary | ICD-10-CM | POA: Diagnosis not present

## 2022-04-10 DIAGNOSIS — I10 Essential (primary) hypertension: Secondary | ICD-10-CM

## 2022-04-10 NOTE — Progress Notes (Signed)
Cardiology Clinic Note   Patient Name: Evelyn Dunn Date of Encounter: 04/10/2022  Primary Care Provider:  [facility] Primary Cardiologist:  Nelva Bush, MD  Patient Profile    71 year old female with a history of hypertension, CKD stage III, HFrEF, nonischemic cardiomyopathy, ICD, moderate to severe MR, left bundle branch block, tobacco use, who is here today for follow-up on her nonischemic cardiomyopathy and HFrEF.  Past Medical History    Past Medical History:  Diagnosis Date   Chronic HFrEF (heart failure with reduced ejection fraction) ([place])    a. 11/2020 Echo: EF 20-25%, glob HK. Nl RV size/fxn, sev BAE. Mod-sev MR. Mild AI; b. 11/2020 Cath: CO/CI 3.6/1.9 respectively.   CKD (chronic kidney disease), stage III ([place])    Hypertension    a. 11/2020 Renal Duplex: No high grade RAS.   LBBB (left bundle branch block)    Moderate to severe mitral regurgitation    NICM (nonischemic cardiomyopathy) ([place])    a. 11/2020 Echo: EF 20-25%, glob HK; b. 11/2020 Cath: nonobs dzs.   Nonobstructive CAD (coronary artery disease)    a. 11/2020 Cath: LM nl, LAD 25p, 65/17m, D2 30, LCX 29m, OM1 mod dzs, RCA mild diff dzs, RPDA 20-->Med rx.   Past Surgical History:  Procedure Laterality Date   BIV ICD INSERTION CRT-D N/A 10/16/2021   Procedure: BIV ICD INSERTION CRT-D;  Surgeon: Vickie Epley, MD;  Location: [facility];  Service: Cardiovascular;  Laterality: N/A;   RIGHT/LEFT HEART CATH AND CORONARY ANGIOGRAPHY N/A 12/05/2020   Procedure: RIGHT/LEFT HEART CATH AND CORONARY ANGIOGRAPHY;  Surgeon: Nelva Bush, MD;  Location: [facility];  Service: Cardiovascular;  Laterality: N/A;    Allergies  No Known Allergies  History of Present Illness    Evelyn Dunn is a 71 year old female with complex past medical history of hypertension, CKD stage III, nonischemic cardiomyopathy, HFrEF, moderate to severe MR, LBBB, tobacco use, and recent CRT-D  device implanted 10/16/2021.  She originally presented to [facility] on 11/2020 with complaint of dyspnea and hypertensive urgency.  Echocardiogram revealed EF of 20-25%, global hypokinesis with moderate to severe MR.  EKG revealed new left bundle branch block and cardiac work-up was negative for ACS.  She was initially trialed on diuretic therapy however developed hypotension and creatinine increased 2.75.  Right/left heart catheterization was performed by Dr. Saunders Revel on 6/20 which revealed moderate to severe single-vessel disease with 20 to 30% proximal and tandem 50 to 70% mid LAD stenosis, mild disease involving the left circumflex and RCA.  Severity of cardiomyopathy is under proportion to degree of coronary artery disease suggestive primarily of nonischemic etiology.  She was diuresed and optimized on goal-directed medical therapy prior to discharge with Entresto, Toprol, BiDil.  She presented for hospital follow-up on 12/2020 with dyspnea and hypertensive urgency.  She was euvolemic on exam and tachycardic.  She had intermittently been following with Darylene Price, next rotation heart failure clinic.  In September 2022 she reported doing well and was started on Farxiga by the heart failure clinic and was limited to titrating other GDMT due to acute CKD.  On 07/2021 she was referred to Dr. Quentin Ore for ICD implant.  In April 2023 she was followed up in clinic by Dr. Saunders Revel when she was reported to have stopped all of her GDMT due to the cost.  At which point she was encouraged to restart Delene Loll and Farxiga and was started on aspirin with the  addition of rosuvastatin and carvedilol to her medication regimen.  In May 2023 she underwent successful implantation of [facility] CRT/D implant by Dr. Quentin Ore.  She was last seen in clinic on 01/24/2022 by Dr. Quentin Ore who stated she was doing well since her CRT/D implant on 10/16/2021.  She was continued on her medications as well as Entresto, repeat echocardiogram, and follow-up  with EP in 1 year.  Echocardiogram completed 02/27/2022 revealed LVEF of 30-35%, left ventricular is mildly decreased function, global hypokinesis, mild left ventricular hypertrophy, G1 DD, mild to moderate mitral regurgitation, and borderline dilatation of the ascending aorta measuring 38 mm.  She returns to clinic today stating that she has been feeling well.  She denies any chest pain, shortness of palpitations, peripheral edema today.  She says she has been compliant with medication regimen.  Unfortunately her mother-in-law did pass away this week.  So she is under increased amount of stress at this time.  She denies any hospitalizations or visits to the emergency department.  Home Medications    Current Outpatient Medications  Medication Sig Dispense Refill   acetaminophen (TYLENOL) 325 MG tablet Take 1-2 tablets (325-650 mg total) by mouth every 4 (four) hours as needed for mild pain.     aspirin 81 MG chewable tablet Chew 1 tablet (81 mg total) by mouth daily. 90 tablet 0   carvedilol (COREG) 3.125 MG tablet Take 1 tablet (3.125 mg total) by mouth 2 (two) times daily. 60 tablet 3   diphenhydrAMINE (BENADRYL) 25 MG tablet Take 50 mg by mouth at bedtime as needed for allergies or sleep.     famotidine (PEPCID) 20 MG tablet Take 40 mg by mouth daily.     rosuvastatin (CRESTOR) 10 MG tablet Take 1 tablet (10 mg total) by mouth daily. 30 tablet 3   sacubitril-valsartan (ENTRESTO) 24-26 MG Take 1 tablet by mouth 2 (two) times daily. 60 tablet 3   No current facility-administered medications for this visit.     Family History    Family History  Problem Relation Age of Onset   Valvular heart disease Mother    Heart failure Father    Coronary artery disease Sister    Vascular Disease Brother    She indicated that the status of her mother is unknown. She indicated that the status of her father is unknown. She indicated that the status of her sister is unknown. She indicated that the status  of her brother is unknown.  Social History    Social History   Socioeconomic History   Marital status: Widowed    Spouse name: Not on file   Number of children: Not on file   Years of education: Not on file   Highest education level: Not on file  Occupational History   Not on file  Tobacco Use   Smoking status: Every Day    Packs/day: 0.25    Types: Cigarettes   Smokeless tobacco: Never  Vaping Use   Vaping Use: Never used  Substance and Sexual Activity   Alcohol use: Not Currently    Alcohol/week: 1.0 standard drink of alcohol    Types: 1 Cans of beer per week    Comment: 1 beer every evening   Drug use: Never   Sexual activity: Not on file  Other Topics Concern   Not on file  Social History Narrative   Not on file   Social Determinants of Health   Financial Resource Strain: Not on file  Food Insecurity: Not  on file  Transportation Needs: Not on file  Physical Activity: Not on file  Stress: Not on file  Social Connections: Not on file  Intimate Partner Violence: Not on file     Review of Systems    General:  No chills, fever, night sweats or weight changes. Endorses  exertional fatigue Cardiovascular:  No chest pain, dyspnea on exertion, edema, orthopnea, palpitations, paroxysmal nocturnal dyspnea. Dermatological: No rash, lesions/masses Respiratory: No cough, dyspnea Urologic: No hematuria, dysuria Abdominal:   No nausea, vomiting, diarrhea, bright red blood per rectum, melena, or hematemesis Neurologic:  No visual changes, wkns, changes in mental status. All other systems reviewed and are otherwise negative except as noted above.   Physical Exam    VS:  BP 124/82 (BP Location: Left Arm, Patient Position: Sitting, Cuff Size: Large)   Pulse 90   Ht 5\' 6"  (1.676 m)   Wt 214 lb (97.1 kg)   LMP  (LMP Unknown)   SpO2 98%   BMI 34.54 kg/m  , BMI Body mass index is 34.54 kg/m.     GEN: Well nourished, well developed, in no acute distress. HEENT:  normal. Neck: Supple, no JVD, carotid bruits, or masses. Cardiac: RRR, no murmurs, rubs, or gallops. No clubbing, cyanosis, trace pretibial edema.  Radials/DP/PT 2+ and equal bilaterally.  Respiratory:  Respirations regular and unlabored, clear to auscultation bilaterally. GI: Soft, nontender, nondistended, BS + x 4. MS: no deformity or atrophy. Skin: warm and dry, no rash. Neuro:  Strength and sensation are intact. Psych: Normal affect.  Accessory Clinical Findings    ECG personally reviewed by me today-a sensed and V paced rate of 90- No acute changes  Lab Results  Component Value Date   WBC 4.0 10/02/2021   HGB 15.6 10/02/2021   HCT 44.1 10/02/2021   MCV 93 10/02/2021   PLT 205 10/02/2021   Lab Results  Component Value Date   CREATININE 1.28 (H) 11/03/2021   BUN 30 (H) 11/03/2021   NA 136 11/03/2021   K 4.0 11/03/2021   CL 107 11/03/2021   CO2 22 11/03/2021   Lab Results  Component Value Date   ALT 7 09/21/2021   AST 15 09/21/2021   ALKPHOS 47 09/21/2021   BILITOT 0.6 09/21/2021   Lab Results  Component Value Date   CHOL 162 11/25/2020   HDL 41 11/25/2020   LDLCALC 101 (H) 11/25/2020   TRIG 98 11/25/2020   CHOLHDL 4.0 11/25/2020    Lab Results  Component Value Date   HGBA1C 6.4 (H) 11/25/2020    Assessment & Plan   1.  HFrEF with an LVEF of 25-30% with recent BiV ICD placement.  Remains euvolemic on exam today.  Denies shortness of breath or dyspnea on exertion peripheral edema or weight gain.  Continued on carvedilol 3.125 mg twice daily, Entresto 24/26 mg twice daily without increasing area dosing due to chronically elevated kidney function.  She has been sent for a repeat BMP today to evaluate her kidney function and electrolytes.  We will continue to escalate GDMT as tolerated and kidney function allows.  2.  Disease of the right/left heart catheterization demonstrated moderate to severe single-vessel disease with 20-30% proximal and tandem 50 to 70% mid  LAD stenosis.  Stable today without anginal symptoms on exam.  She is continued aspirin, carvedilol, and rosuvastatin.  3.  Nonischemic cardiomyopathy/BiV ICD [employer]) placed 10/16/2021 by Dr. Quentin Ore without complications.  She has encouraged to continue with her remote interrogations and  her office follow-ups with device clinic and her EP appointments.  4.  Hypertension with a blood pressure today of 124/82.  Blood pressure is stable and is slightly improved since previous visit.  She is continued on Coreg and Entresto.  She is also been encouraged to continue to monitor blood pressure at home  5.  Hyperlipidemia LDL 101 11/25/20.  She is continued on rosuvastatin 10 mg daily and encouraged to continue with dietary changes.  Lipid panel was not rechecked by her PCP on her return visit we will order lipid panel at that time.  6.  Today with a BMI of 34.54 slightly increased from previous visit.  She is encouraged to continue with increasing her activity as tolerated as well as continuing with dietary changes  7.  Disposition  patient to return to clinic to see MD/APP in 3 months or sooner if needed  Charlina Dwight, NP 04/10/2022, 7:58 PM

## 2022-04-10 NOTE — Patient Instructions (Addendum)
Medication Instructions:  No changes at this time.   *If you need a refill on your cardiac medications before your next appointment, please call your pharmacy*   Lab Work: [facility] today over at the [facility]. Go to registration desk to check in.   If you have labs (blood work) drawn today and your tests are completely normal, you will receive your results only by: [place] (if you have MyChart) OR A paper copy in the mail If you have any lab test that is abnormal or we need to change your treatment, we will call you to review the results.   Testing/Procedures: None   Follow-Up: At [facility], you and your health needs are our priority.  As part of our continuing mission to provide you with exceptional heart care, we have created designated Provider Care Teams.  These Care Teams include your primary Cardiologist (physician) and Advanced Practice Providers (APPs -  Physician Assistants and Nurse Practitioners) who all work together to provide you with the care you need, when you need it.  We recommend signing up for the patient portal called "MyChart".  Sign up information is provided on this After Visit Summary.  MyChart is used to connect with patients for Virtual Visits (Telemedicine).  Patients are able to view lab/test results, encounter notes, upcoming appointments, etc.  Non-urgent messages can be sent to your provider as well.   To learn more about what you can do with MyChart, go to [email or web address].    Your next appointment:   3 month(s)  The format for your next appointment:   In Person  Provider:   Nelva Bush, MD or Gerrie Nordmann, NP        Important Information About Sugar

## 2022-04-19 ENCOUNTER — Telehealth: Payer: Self-pay

## 2022-04-19 ENCOUNTER — Ambulatory Visit (INDEPENDENT_AMBULATORY_CARE_PROVIDER_SITE_OTHER): Payer: Medicare PPO

## 2022-04-19 DIAGNOSIS — I428 Other cardiomyopathies: Secondary | ICD-10-CM | POA: Diagnosis not present

## 2022-04-19 LAB — CUP PACEART REMOTE DEVICE CHECK
Battery Remaining Longevity: 132 mo
Battery Remaining Percentage: 100 %
Brady Statistic RA Percent Paced: 0 %
Brady Statistic RV Percent Paced: 0 %
Date Time Interrogation Session: 20231102005100
HighPow Impedance: 74 Ohm
Implantable Lead Connection Status: 753985
Implantable Lead Connection Status: 753985
Implantable Lead Connection Status: 753985
Implantable Lead Implant Date: 20230501
Implantable Lead Implant Date: 20230501
Implantable Lead Implant Date: 20230501
Implantable Lead Location: 753858
Implantable Lead Location: 753859
Implantable Lead Location: 753860
Implantable Lead Model: 672
Implantable Lead Model: 7841
Implantable Lead Model: 7842
Implantable Lead Serial Number: 1169613
Implantable Lead Serial Number: 1263550
Implantable Lead Serial Number: 185133
Implantable Pulse Generator Implant Date: 20230501
Lead Channel Impedance Value: 454 Ohm
Lead Channel Impedance Value: 637 Ohm
Lead Channel Impedance Value: 716 Ohm
Lead Channel Pacing Threshold Amplitude: 0.4 V
Lead Channel Pacing Threshold Amplitude: 0.6 V
Lead Channel Pacing Threshold Pulse Width: 0.4 ms
Lead Channel Pacing Threshold Pulse Width: 0.4 ms
Lead Channel Setting Pacing Amplitude: 0.1 V
Lead Channel Setting Pacing Amplitude: 1.6 V
Lead Channel Setting Pacing Amplitude: 2 V
Lead Channel Setting Pacing Pulse Width: 0.4 ms
Lead Channel Setting Pacing Pulse Width: 0.4 ms
Lead Channel Setting Sensing Sensitivity: 0.5 mV
Lead Channel Setting Sensing Sensitivity: 1 mV
Pulse Gen Serial Number: 511069

## 2022-04-19 NOTE — Telephone Encounter (Signed)
Appt scheduled with CL in [place] for device adjustment.

## 2022-04-19 NOTE — Telephone Encounter (Signed)
Discussed with Dr. Quentin Ore.  Need to shorten AV delays to encourage CRT pacing.  Will schedule follow up in a few weeks with BS rep present.

## 2022-04-19 NOTE — Telephone Encounter (Signed)
Known decrease in LV impedance-evaluated with chest xray.  Decrease in CRT pacing-last office visit 01/24/2022 patient pacing 100%.  Now pacing 84%.  Will discuss with CL.

## 2022-04-30 NOTE — Progress Notes (Signed)
Remote ICD transmission.   

## 2022-05-02 ENCOUNTER — Encounter: Payer: Self-pay | Admitting: Cardiology

## 2022-05-02 ENCOUNTER — Ambulatory Visit: Payer: Medicare PPO | Attending: Cardiology | Admitting: Cardiology

## 2022-05-02 ENCOUNTER — Other Ambulatory Visit
Admission: RE | Admit: 2022-05-02 | Discharge: 2022-05-02 | Disposition: A | Discharge status: Home / Self Care | Payer: Medicare PPO | Source: Ambulatory Visit | Attending: Cardiology | Admitting: Cardiology

## 2022-05-02 VITALS — BP 138/78 | HR 70 | Ht 66.0 in | Wt 216.0 lb

## 2022-05-02 DIAGNOSIS — Z95810 Presence of automatic (implantable) cardiac defibrillator: Secondary | ICD-10-CM | POA: Diagnosis not present

## 2022-05-02 DIAGNOSIS — I428 Other cardiomyopathies: Secondary | ICD-10-CM | POA: Diagnosis not present

## 2022-05-02 DIAGNOSIS — I5022 Chronic systolic (congestive) heart failure: Secondary | ICD-10-CM | POA: Diagnosis present

## 2022-05-02 DIAGNOSIS — I2510 Atherosclerotic heart disease of native coronary artery without angina pectoris: Secondary | ICD-10-CM | POA: Diagnosis present

## 2022-05-02 LAB — CUP PACEART INCLINIC DEVICE CHECK
Date Time Interrogation Session: 20231115102742
Implantable Lead Connection Status: 753985
Implantable Lead Connection Status: 753985
Implantable Lead Connection Status: 753985
Implantable Lead Implant Date: 20230501
Implantable Lead Implant Date: 20230501
Implantable Lead Implant Date: 20230501
Implantable Lead Location: 753858
Implantable Lead Location: 753859
Implantable Lead Location: 753860
Implantable Lead Model: 672
Implantable Lead Model: 7841
Implantable Lead Model: 7842
Implantable Lead Serial Number: 1169613
Implantable Lead Serial Number: 1263550
Implantable Lead Serial Number: 185133
Implantable Pulse Generator Implant Date: 20230501
Lead Channel Setting Pacing Amplitude: 0.1 V
Lead Channel Setting Pacing Amplitude: 1.6 V
Lead Channel Setting Pacing Amplitude: 2 V
Lead Channel Setting Pacing Pulse Width: 0.4 ms
Lead Channel Setting Pacing Pulse Width: 0.4 ms
Lead Channel Setting Sensing Sensitivity: 0.5 mV
Lead Channel Setting Sensing Sensitivity: 1 mV
Pulse Gen Serial Number: 511069

## 2022-05-02 LAB — BASIC METABOLIC PANEL
Anion gap: 10 (ref 5–15)
BUN: 23 mg/dL (ref 8–23)
CO2: 22 mmol/L (ref 22–32)
Calcium: 9.5 mg/dL (ref 8.9–10.3)
Chloride: 107 mmol/L (ref 98–111)
Creatinine, Ser: 1.31 mg/dL — ABNORMAL HIGH (ref 0.44–1.00)
GFR, Estimated: 44 mL/min — ABNORMAL LOW (ref >60)
Glucose, Bld: 130 mg/dL — ABNORMAL HIGH (ref 70–99)
Potassium: 4.3 mmol/L (ref 3.5–5.1)
Sodium: 139 mmol/L (ref 135–145)

## 2022-05-02 NOTE — Progress Notes (Signed)
Electrophysiology Office Follow up Visit Note:    Date:  05/02/2022   ID:  Evelyn Dunn, DOB Aug 30, 1950, MRN [id number]  PCP:  [facility] Cardiologist:  Yvonne Kendall, MD  [facility] Electrophysiologist:  Lanier Prude, MD    Interval History:    Evelyn Dunn is a 71 y.o. female who presents for a follow up visit. I last saw the patient January 24, 2022.  She had a CRT-D that was implanted Oct 17, 2021.  Her LV lead is in the left bundle position due to a tortuous coronary sinus.  At the time of her last appointment she is doing well.  After implant, her LV impedance declined and her CRT pacing percentage decreased, potentially due to intrinsic conduction "beating out" the pacemaker intervals.  She presents today for further evaluation of her CRT-D system.  She reports doing very well since I last saw her.  She thinks her energy level is improving.  No swelling.  No lightheadedness or dizziness.  No syncope.  Incision is healing well.       Past Medical History:  Diagnosis Date   Chronic HFrEF (heart failure with reduced ejection fraction) ([facility])    a. 11/2020 Echo: EF 20-25%, glob HK. Nl RV size/fxn, sev BAE. Mod-sev MR. Mild AI; b. 11/2020 Cath: CO/CI 3.6/1.9 respectively.   CKD (chronic kidney disease), stage III (HCC)    Hypertension    a. 11/2020 Renal Duplex: No high grade RAS.   LBBB (left bundle branch block)    Moderate to severe mitral regurgitation    NICM (nonischemic cardiomyopathy) ([facility])    a. 11/2020 Echo: EF 20-25%, glob HK; b. 11/2020 Cath: nonobs dzs.   Nonobstructive CAD (coronary artery disease)    a. 11/2020 Cath: LM nl, LAD 25p, 65/54m, D2 30, LCX 30m, OM1 mod dzs, RCA mild diff dzs, RPDA 20-->Med rx.    Past Surgical History:  Procedure Laterality Date   BIV ICD INSERTION CRT-D N/A 10/16/2021   Procedure: BIV ICD INSERTION CRT-D;  Surgeon: Lanier Prude, MD;  Location: [facility];  Service:  Cardiovascular;  Laterality: N/A;   RIGHT/LEFT HEART CATH AND CORONARY ANGIOGRAPHY N/A 12/05/2020   Procedure: RIGHT/LEFT HEART CATH AND CORONARY ANGIOGRAPHY;  Surgeon: Yvonne Kendall, MD;  Location: [facility] INVASIVE [facility] LAB;  Service: Cardiovascular;  Laterality: N/A;    Current Medications: No outpatient medications have been marked as taking for the 05/02/22 encounter (Appointment) with Lanier Prude, MD.     Allergies:   Patient has no known allergies.   Social History   Socioeconomic History   Marital status: Widowed    Spouse name: Not on file   Number of children: Not on file   Years of education: Not on file   Highest education level: Not on file  Occupational History   Not on file  Tobacco Use   Smoking status: Every Day    Packs/day: 0.25    Types: Cigarettes   Smokeless tobacco: Never  Vaping Use   Vaping Use: Never used  Substance and Sexual Activity   Alcohol use: Not Currently    Alcohol/week: 1.0 standard drink of alcohol    Types: 1 Cans of beer per week    Comment: 1 beer every evening   Drug use: Never   Sexual activity: Not on file  Other Topics Concern   Not on file  Social History Narrative   Not on file   Social Determinants  of Health   Financial Resource Strain: Not on file  Food Insecurity: Not on file  Transportation Needs: Not on file  Physical Activity: Not on file  Stress: Not on file  Social Connections: Not on file     Family History: The patient's family history includes Coronary artery disease in her sister; Heart failure in her father; Valvular heart disease in her mother; Vascular Disease in her brother.  ROS:   Please see the history of present illness.    All other systems reviewed and are negative.  EKGs/Labs/Other Studies Reviewed:    The following studies were reviewed today:  May 02, 2022 in clinic device interrogation personally reviewed Battery longevity okay.  Lead parameters stable.  I did an AV  optimization with twelve-lead EKG during today's visit.  I was able to narrow the QRS significantly with shortening of the AV delays by [place] and the LV lead.  Morphology changed considerably and the QRS narrowed to about 160 ms from roughly 200 ms at presentation.  No high-voltage therapies.   EKG:  The ekg ordered today demonstrates QRS duration of 200 ms on presenting EKG.  A sensed, V paced presenting.  Intrinsic rhythm is sinus with wide left bundle.  Shortening of the AV delays narrow the QRS considerably down to about 160 ms.  Recent Labs: 09/21/2021: ALT 7 10/02/2021: Hemoglobin 15.6; Platelets 205 11/03/2021: BUN 30; Creatinine, Ser 1.28; Potassium 4.0; Sodium 136  Recent Lipid Panel    Component Value Date/Time   CHOL 162 11/25/2020 1053   TRIG 98 11/25/2020 1053   HDL 41 11/25/2020 1053   CHOLHDL 4.0 11/25/2020 1053   VLDL 20 11/25/2020 1053   LDLCALC 101 (H) 11/25/2020 1053    Physical Exam:    VS:  LMP  (LMP Unknown)     Wt Readings from Last 3 Encounters:  04/10/22 214 lb (97.1 kg)  01/24/22 210 lb 2 oz (95.3 kg)  01/04/22 208 lb 3.2 oz (94.4 kg)     GEN:  Well nourished, well developed in no acute distress HEENT: Normal NECK: No JVD; No carotid bruits LYMPHATICS: No lymphadenopathy CARDIAC: RRR, no murmurs, rubs, gallops.  Device pocket well-healed. RESPIRATORY:  Clear to auscultation without rales, wheezing or rhonchi  ABDOMEN: Soft, non-tender, non-distended MUSCULOSKELETAL:  No edema; No deformity  SKIN: Warm and dry NEUROLOGIC:  Alert and oriented x 3 PSYCHIATRIC:  Normal affect        ASSESSMENT:    1. Chronic HFrEF (heart failure with reduced ejection fraction) (HCC)   2. Non-ischemic cardiomyopathy (HCC)   3. Cardiac resynchronization therapy defibrillator (CRT-D) in place    PLAN:    In order of problems listed above:   #Chronic systolic heart failure NYHA class II.  Warm and dry on exam. Continue current medical therapy. Continue  CRT-D therapy.  #CRT-D in situ Left bundle lead present in the LV port.  Tortuous CS anatomy. Continue remote monitoring. Optimized AV delays during today's visit with considerable change in QRS duration/morphology.  Follow-up 1 year or sooner as needed.  APP appointment.       Medication Adjustments/Labs and Tests Ordered: Current medicines are reviewed at length with the patient today.  Concerns regarding medicines are outlined above.  No orders of the defined types were placed in this encounter.  No orders of the defined types were placed in this encounter.    Signed, Steffanie Dunn, MD, [facility], [facility] 05/02/2022 5:49 AM    Electrophysiology [facility]

## 2022-05-02 NOTE — Patient Instructions (Signed)
Medication Instructions:  None  *If you need a refill on your cardiac medications before your next appointment, please call your pharmacy*   Lab Work: None  If you have labs (blood work) drawn today and your tests are completely normal, you will receive your results only by: MyChart Message (if you have MyChart) OR A paper copy in the mail If you have any lab test that is abnormal or we need to change your treatment, we will call you to review the results.   Testing/Procedures: None    Follow-Up: At [facility], you and your health needs are our priority.  As part of our continuing mission to provide you with exceptional heart care, we have created designated Provider Care Teams.  These Care Teams include your primary Cardiologist (physician) and Advanced Practice Providers (APPs -  Physician Assistants and Nurse Practitioners) who all work together to provide you with the care you need, when you need it.  We recommend signing up for the patient portal called "MyChart".  Sign up information is provided on this After Visit Summary.  MyChart is used to connect with patients for Virtual Visits (Telemedicine).  Patients are able to view lab/test results, encounter notes, upcoming appointments, etc.  Non-urgent messages can be sent to your provider as well.   To learn more about what you can do with MyChart, go to [email or web address].    Your next appointment:   1 year(s)  The format for your next appointment:   In Person  Provider:   You will see one of the following Advanced Practice Providers on your designated Care Team:   Christopher Berge, NP Ryan Dunn, PA-C Cadence Furth, PA-C Sheri Hammock, NP      Other Instructions None   Important Information About Sugar       

## 2022-05-04 ENCOUNTER — Encounter: Payer: Self-pay | Admitting: Cardiology

## 2022-07-02 ENCOUNTER — Other Ambulatory Visit: Payer: Self-pay | Admitting: Internal Medicine

## 2022-07-10 ENCOUNTER — Ambulatory Visit: Payer: Medicare PPO | Attending: Cardiology | Admitting: Cardiology

## 2022-07-10 ENCOUNTER — Encounter: Payer: Self-pay | Admitting: Cardiology

## 2022-07-10 VITALS — BP 140/88 | HR 94 | Ht 66.0 in | Wt 217.4 lb

## 2022-07-10 DIAGNOSIS — I5022 Chronic systolic (congestive) heart failure: Secondary | ICD-10-CM

## 2022-07-10 DIAGNOSIS — I428 Other cardiomyopathies: Secondary | ICD-10-CM | POA: Diagnosis not present

## 2022-07-10 DIAGNOSIS — E785 Hyperlipidemia, unspecified: Secondary | ICD-10-CM

## 2022-07-10 DIAGNOSIS — I2510 Atherosclerotic heart disease of native coronary artery without angina pectoris: Secondary | ICD-10-CM

## 2022-07-10 DIAGNOSIS — I10 Essential (primary) hypertension: Secondary | ICD-10-CM

## 2022-07-10 NOTE — Progress Notes (Signed)
Cardiology Clinic Note   Patient Name: Evelyn Dunn Date of Encounter: 07/10/2022  Primary Care Provider:  [facility] Primary Cardiologist:  Yvonne Kendall, MD  Patient Profile    72 year old with a history of nonobstructive coronary artery disease, CKD stage III, HFrEF, nonischemic cardiomyopathy, ICD, moderately severe MR, essential hypertension, hyperlipidemia, morbid obesity, chronic left bundle branch block, tobacco use, who is here today for follow-up of her nonischemic cardiomyopathy and HFrEF.  Past Medical History    Past Medical History:  Diagnosis Date   Chronic HFrEF (heart failure with reduced ejection fraction) (HCC)    a. 11/2020 Echo: EF 20-25%, glob HK. Nl RV size/fxn, sev BAE. Mod-sev MR. Mild AI; b. 11/2020 Cath: CO/CI 3.6/1.9 respectively.   CKD (chronic kidney disease), stage III (HCC)    Hypertension    a. 11/2020 Renal Duplex: No high grade RAS.   LBBB (left bundle branch block)    Moderate to severe mitral regurgitation    NICM (nonischemic cardiomyopathy) (HCC)    a. 11/2020 Echo: EF 20-25%, glob HK; b. 11/2020 Cath: nonobs dzs.   Nonobstructive CAD (coronary artery disease)    a. 11/2020 Cath: LM nl, LAD 25p, 65/60m, D2 30, LCX 39m, OM1 mod dzs, RCA mild diff dzs, RPDA 20-->Med rx.   Past Surgical History:  Procedure Laterality Date   BIV ICD INSERTION CRT-D N/A 10/16/2021   Procedure: BIV ICD INSERTION CRT-D;  Surgeon: Lanier Prude, MD;  Location: [facility] INVASIVE CV LAB;  Service: Cardiovascular;  Laterality: N/A;   RIGHT/LEFT HEART CATH AND CORONARY ANGIOGRAPHY N/A 12/05/2020   Procedure: RIGHT/LEFT HEART CATH AND CORONARY ANGIOGRAPHY;  Surgeon: Yvonne Kendall, MD;  Location: [facility] INVASIVE CV LAB;  Service: Cardiovascular;  Laterality: N/A;    Allergies  No Known Allergies  History of Present Illness    Evelyn Dunn is a 72 year old female with previously past medical history of nonobstructive coronary artery disease  found on left heart catheterization in 11/2020 with left main normal, LAD 25%, proximally, 50 to 65%, D2 with 30% stenosis, left circumflex 30% mid stenosis, OM1 moderate disease, RCA disease, RPDA 20%, CKD stage III, HFrEF, nonischemic cardiomyopathy with ICD insertion 10/16/2021, moderately severe MR, essential hypertension, hyperlipidemia, morbid obesity, chronic left bundle branch block on EKG, and tobacco use.  Echocardiogram completed 11/26/2020 revealed LVEF 20 to 25%, global hypokinesis, moderately reduced right ventricular function, severely dilated left and right atrium, moderate to severe mitral valve regurgitation, mild aortic valve regurgitation.  Repeat echocardiogram completed 06/01/2021 revealed LVEF of 25 to 30%, left ventricular severely decreased function and global hypokinesis, G1 DD, aortic valve regurgitation is mild, mild to moderate mitral valve regurgitation.  10/16/2021 she underwent BiV ICD insertion with a CRT-D.  02/27/2022 echocardiogram revealed LVEF of 30-35%, moderately decreased function, global hypokinesis, G1 DD, mild to moderate mitral regurgitation, mild aortic regurgitation, borderline dilatation of the ascending aorta measuring 30 mm.  She was last seen in clinic 04/06/2022 where she was feeling relatively well.  She had been under little bit of stress as of late with the recent passing of her mother-in-law.  There were no medication changes that were made due to her chronically elevated kidney function and no further testing that was required at that time.  She returns to clinic today stating that she has been doing well.  She denies any chest pain, shortness of breath, palpitations, or peripheral edema.  She denies any recent hospitalizations or visits to the hospital.  She has  also followed up with Dr. Lalla Brothers with device clinic and is excited to leave after return in 1 year.  Home Medications    Current Outpatient Medications  Medication Sig Dispense Refill    acetaminophen (TYLENOL) 325 MG tablet Take 1-2 tablets (325-650 mg total) by mouth every 4 (four) hours as needed for mild pain.     aspirin 81 MG chewable tablet Chew 1 tablet (81 mg total) by mouth daily. 90 tablet 0   carvedilol (COREG) 3.125 MG tablet Take 1 tablet (3.125 mg total) by mouth 2 (two) times daily. 60 tablet 3   diphenhydrAMINE (BENADRYL) 25 MG tablet Take 50 mg by mouth at bedtime as needed for allergies or sleep.     ENTRESTO 24-26 MG Take 1 tablet by mouth twice daily 60 tablet 0   famotidine (PEPCID) 20 MG tablet Take 40 mg by mouth daily.     rosuvastatin (CRESTOR) 10 MG tablet Take 1 tablet (10 mg total) by mouth daily. 30 tablet 3   No current facility-administered medications for this visit.     Family History    Family History  Problem Relation Age of Onset   Valvular heart disease Mother    Heart failure Father    Coronary artery disease Sister    Vascular Disease Brother    She indicated that the status of her mother is unknown. She indicated that the status of her father is unknown. She indicated that the status of her sister is unknown. She indicated that the status of her brother is unknown.  Social History    Social History   Socioeconomic History   Marital status: Widowed    Spouse name: Not on file   Number of children: Not on file   Years of education: Not on file   Highest education level: Not on file  Occupational History   Not on file  Tobacco Use   Smoking status: Every Day    Packs/day: 0.25    Types: Cigarettes   Smokeless tobacco: Never  Vaping Use   Vaping Use: Never used  Substance and Sexual Activity   Alcohol use: Not Currently    Alcohol/week: 1.0 standard drink of alcohol    Types: 1 Cans of beer per week    Comment: 1 beer every evening   Drug use: Never   Sexual activity: Not on file  Other Topics Concern   Not on file  Social History Narrative   Not on file   Social Determinants of Health   Financial Resource  Strain: Not on file  Food Insecurity: Not on file  Transportation Needs: Not on file  Physical Activity: Not on file  Stress: Not on file  Social Connections: Not on file  Intimate Partner Violence: Not on file     Review of Systems    General:  No chills, fever, night sweats or weight changes.  Endorses exertional fatigue Cardiovascular:  No chest pain, dyspnea on exertion, edema, orthopnea, palpitations, paroxysmal nocturnal dyspnea. Dermatological: No rash, lesions/masses Respiratory: No cough, dyspnea Urologic: No hematuria, dysuria Abdominal:   No nausea, vomiting, diarrhea, bright red blood per rectum, melena, or hematemesis Neurologic:  No visual changes, wkns, changes in mental status. All other systems reviewed and are otherwise negative except as noted above.   Physical Exam    VS:  BP (!) 140/88 (BP Location: Left Arm, Patient Position: Sitting, Cuff Size: Normal)   Pulse 94   Ht 5\' 6"  (1.676 m)   Wt 217 lb  6.4 oz (98.6 kg)   LMP  (LMP Unknown)   SpO2 97%   BMI 35.09 kg/m  , BMI Body mass index is 35.09 kg/m.     Vitals:   07/10/22 0200 07/10/22 1334  BP: (!) 145/95 (!) 140/88  Patient has not taken any of her medications prior to her visit today  GEN: Well nourished, well developed, in no acute distress. HEENT: normal. Glasses on Neck: Supple, no JVD, carotid bruits, or masses. Cardiac: RRR, no murmurs, rubs, or gallops. No clubbing, cyanosis, edema.  Radials 2+/PT 2+ and equal bilaterally.  Respiratory:  Respirations regular and unlabored, clear to auscultation bilaterally. GI: Soft, nontender, nondistended, BS + x 4. MS: no deformity or atrophy. Skin: warm and dry, no rash. Neuro:  Strength and sensation are intact. Psych: Normal affect.  Accessory Clinical Findings    ECG personally reviewed by me today- no new tracings completed today  Lab Results  Component Value Date   WBC 4.0 10/02/2021   HGB 15.6 10/02/2021   HCT 44.1 10/02/2021   MCV 93  10/02/2021   PLT 205 10/02/2021   Lab Results  Component Value Date   CREATININE 1.31 (H) 05/02/2022   BUN 23 05/02/2022   NA 139 05/02/2022   K 4.3 05/02/2022   CL 107 05/02/2022   CO2 22 05/02/2022   Lab Results  Component Value Date   ALT 7 09/21/2021   AST 15 09/21/2021   ALKPHOS 47 09/21/2021   BILITOT 0.6 09/21/2021   Lab Results  Component Value Date   CHOL 162 11/25/2020   HDL 41 11/25/2020   LDLCALC 101 (H) 11/25/2020   TRIG 98 11/25/2020   CHOLHDL 4.0 11/25/2020    Lab Results  Component Value Date   HGBA1C 6.4 (H) 11/25/2020    Assessment & Plan   1.  HFrEF with an LVEF with most recent LVEF of 30-35% which has improved after BiV ICD insertion.  She remains euvolemic on exam today.  Continue shortness of breath and dyspnea on exertion or peripheral edema.  She does have some noted weight gain but blames that on the holidays.  She is continued on carvedilol and Entresto without being able to escalate GDMT today as patient has not had any of her medications yet today and she also has elevated serum creatinine at baseline.  She also requires no refills today.  2.  Coronary artery disease with right and left heart catheterization demonstrated moderate to severe single-vessel disease with 20-30% proximal and tandem 50 to 70% mid LAD stenosis.  She is stable today without anginal symptoms on exam.  She is continued on secondary prevention with aspirin and rosuvastatin as well as beta-blocker therapy of carvedilol.  3.  Ischemic cardiomyopathy/BiV ICD ([employer] placed 10/2021 by Dr. Quentin Ore) she is encouraged to continue with her remote interrogations in our office follow-up for device clinic.  Today she has recently followed up with EP and was advised she will need to have another office visit within 1 year.  4.  Hypertension with blood pressure today 140/88 on recheck of 145/95.  Blood pressure is slightly elevated today on further questioning of the patient she  has not had any of her current medications of carvedilol or Entresto at this time.  She is encouraged to monitor her pressures at home as well.  Review of the chart with previous blood pressures reveals that typically when she does take her medication and blood pressure is better controlled and stable.  5.  Hyperlipidemia she is continued on rosuvastatin 10 mg daily encouraged to continue with her dietary changes.  This is previously been followed by her PCP we will request most recent labs from [facility].  6.  BMI 35.09 which continues to increase.  She is encouraged to continue with increasing her activity as tolerated as well as making dietary changes.  7.  Disposition patient return to clinic to see MD/APP in 6 months or sooner if needed.  Dijuan Sleeth, NP 07/10/2022, 2:15 PM

## 2022-07-10 NOTE — Patient Instructions (Signed)
Medication Instructions:  Your Physician recommend you continue on your current medication as directed.    *If you need a refill on your cardiac medications before your next appointment, please call your pharmacy*   Lab Work: None ordered today   Testing/Procedures: None ordered today   Follow-Up: At [facility], you and your health needs are our priority.  As part of our continuing mission to provide you with exceptional heart care, we have created designated Provider Care Teams.  These Care Teams include your primary Cardiologist (physician) and Advanced Practice Providers (APPs -  Physician Assistants and Nurse Practitioners) who all work together to provide you with the care you need, when you need it.  We recommend signing up for the patient portal called "MyChart".  Sign up information is provided on this After Visit Summary.  MyChart is used to connect with patients for Virtual Visits (Telemedicine).  Patients are able to view lab/test results, encounter notes, upcoming appointments, etc.  Non-urgent messages can be sent to your provider as well.   To learn more about what you can do with MyChart, go to [email or web address].    Your next appointment:   6 month(s)  Provider:   You may see Christopher End, MD or one of the following Advanced Practice Providers on your designated Care Team:   Christopher Berge, NP Ryan Dunn, PA-C Cadence Furth, PA-C Sheri Hammock, NP     

## 2022-07-19 ENCOUNTER — Ambulatory Visit (INDEPENDENT_AMBULATORY_CARE_PROVIDER_SITE_OTHER): Payer: Medicare PPO

## 2022-07-19 DIAGNOSIS — I428 Other cardiomyopathies: Secondary | ICD-10-CM | POA: Diagnosis not present

## 2022-07-19 LAB — CUP PACEART REMOTE DEVICE CHECK
Battery Remaining Longevity: 132 mo
Battery Remaining Percentage: 100 %
Brady Statistic RA Percent Paced: 0 %
Brady Statistic RV Percent Paced: 100 %
Date Time Interrogation Session: 20240201005200
HighPow Impedance: 84 Ohm
Implantable Lead Connection Status: 753985
Implantable Lead Connection Status: 753985
Implantable Lead Connection Status: 753985
Implantable Lead Implant Date: 20230501
Implantable Lead Implant Date: 20230501
Implantable Lead Implant Date: 20230501
Implantable Lead Location: 753858
Implantable Lead Location: 753859
Implantable Lead Location: 753860
Implantable Lead Model: 672
Implantable Lead Model: 7841
Implantable Lead Model: 7842
Implantable Lead Serial Number: 1169613
Implantable Lead Serial Number: 1263550
Implantable Lead Serial Number: 185133
Implantable Pulse Generator Implant Date: 20230501
Lead Channel Impedance Value: 458 Ohm
Lead Channel Impedance Value: 656 Ohm
Lead Channel Impedance Value: 675 Ohm
Lead Channel Pacing Threshold Amplitude: 0.5 V
Lead Channel Pacing Threshold Amplitude: 0.6 V
Lead Channel Pacing Threshold Pulse Width: 0.4 ms
Lead Channel Pacing Threshold Pulse Width: 0.4 ms
Lead Channel Setting Pacing Amplitude: 0.1 V
Lead Channel Setting Pacing Amplitude: 1.6 V
Lead Channel Setting Pacing Amplitude: 2 V
Lead Channel Setting Pacing Pulse Width: 0.4 ms
Lead Channel Setting Pacing Pulse Width: 0.4 ms
Lead Channel Setting Sensing Sensitivity: 0.5 mV
Lead Channel Setting Sensing Sensitivity: 1 mV
Pulse Gen Serial Number: 511069

## 2022-08-05 ENCOUNTER — Other Ambulatory Visit: Payer: Self-pay | Admitting: Internal Medicine

## 2022-08-07 ENCOUNTER — Telehealth: Payer: Self-pay | Admitting: Internal Medicine

## 2022-08-07 NOTE — Telephone Encounter (Signed)
Error

## 2022-08-08 NOTE — Progress Notes (Signed)
Remote ICD transmission.   

## 2022-10-18 ENCOUNTER — Ambulatory Visit (INDEPENDENT_AMBULATORY_CARE_PROVIDER_SITE_OTHER): Payer: Medicare PPO

## 2022-10-18 DIAGNOSIS — I428 Other cardiomyopathies: Secondary | ICD-10-CM

## 2022-10-18 LAB — CUP PACEART REMOTE DEVICE CHECK
Battery Remaining Longevity: 132 mo
Battery Remaining Percentage: 100 %
Brady Statistic RA Percent Paced: 0 %
Brady Statistic RV Percent Paced: 100 %
Date Time Interrogation Session: 20240502005200
HighPow Impedance: 76 Ohm
Implantable Lead Connection Status: 753985
Implantable Lead Connection Status: 753985
Implantable Lead Connection Status: 753985
Implantable Lead Implant Date: 20230501
Implantable Lead Implant Date: 20230501
Implantable Lead Implant Date: 20230501
Implantable Lead Location: 753858
Implantable Lead Location: 753859
Implantable Lead Location: 753860
Implantable Lead Model: 672
Implantable Lead Model: 7841
Implantable Lead Model: 7842
Implantable Lead Serial Number: 1169613
Implantable Lead Serial Number: 1263550
Implantable Lead Serial Number: 185133
Implantable Pulse Generator Implant Date: 20230501
Lead Channel Impedance Value: 449 Ohm
Lead Channel Impedance Value: 621 Ohm
Lead Channel Impedance Value: 731 Ohm
Lead Channel Pacing Threshold Amplitude: 0.5 V
Lead Channel Pacing Threshold Amplitude: 0.6 V
Lead Channel Pacing Threshold Pulse Width: 0.4 ms
Lead Channel Pacing Threshold Pulse Width: 0.4 ms
Lead Channel Setting Pacing Amplitude: 0.1 V
Lead Channel Setting Pacing Amplitude: 1.5 V
Lead Channel Setting Pacing Amplitude: 2 V
Lead Channel Setting Pacing Pulse Width: 0.4 ms
Lead Channel Setting Pacing Pulse Width: 0.4 ms
Lead Channel Setting Sensing Sensitivity: 0.5 mV
Lead Channel Setting Sensing Sensitivity: 1 mV
Pulse Gen Serial Number: 511069

## 2022-11-07 NOTE — Progress Notes (Signed)
Remote ICD transmission.   

## 2023-01-17 ENCOUNTER — Ambulatory Visit (INDEPENDENT_AMBULATORY_CARE_PROVIDER_SITE_OTHER): Payer: Medicare PPO

## 2023-01-17 DIAGNOSIS — I428 Other cardiomyopathies: Secondary | ICD-10-CM

## 2023-01-17 LAB — CUP PACEART REMOTE DEVICE CHECK
Battery Remaining Longevity: 132 mo
Battery Remaining Percentage: 100 %
Brady Statistic RA Percent Paced: 0 %
Brady Statistic RV Percent Paced: 100 %
Date Time Interrogation Session: 20240801030100
HighPow Impedance: 82 Ohm
Implantable Lead Connection Status: 753985
Implantable Lead Connection Status: 753985
Implantable Lead Connection Status: 753985
Implantable Lead Implant Date: 20230501
Implantable Lead Implant Date: 20230501
Implantable Lead Implant Date: 20230501
Implantable Lead Location: 753858
Implantable Lead Location: 753859
Implantable Lead Location: 753860
Implantable Lead Model: 672
Implantable Lead Model: 7841
Implantable Lead Model: 7842
Implantable Lead Serial Number: 1169613
Implantable Lead Serial Number: 1263550
Implantable Lead Serial Number: 185133
Implantable Pulse Generator Implant Date: 20230501
Lead Channel Impedance Value: 497 Ohm
Lead Channel Impedance Value: 620 Ohm
Lead Channel Impedance Value: 763 Ohm
Lead Channel Pacing Threshold Amplitude: 0.4 V
Lead Channel Pacing Threshold Amplitude: 0.5 V
Lead Channel Pacing Threshold Pulse Width: 0.4 ms
Lead Channel Pacing Threshold Pulse Width: 0.4 ms
Lead Channel Setting Pacing Amplitude: 0.1 V
Lead Channel Setting Pacing Amplitude: 1.5 V
Lead Channel Setting Pacing Amplitude: 4 V
Lead Channel Setting Pacing Pulse Width: 0.4 ms
Lead Channel Setting Pacing Pulse Width: 0.4 ms
Lead Channel Setting Sensing Sensitivity: 0.5 mV
Lead Channel Setting Sensing Sensitivity: 1 mV
Pulse Gen Serial Number: 511069

## 2023-01-29 NOTE — Progress Notes (Signed)
Remote ICD transmission.   

## 2023-02-03 ENCOUNTER — Other Ambulatory Visit: Payer: Self-pay | Admitting: Internal Medicine

## 2023-02-11 ENCOUNTER — Encounter: Payer: Self-pay | Admitting: Cardiology

## 2023-02-11 ENCOUNTER — Ambulatory Visit: Payer: Medicare PPO | Attending: Cardiology | Admitting: Cardiology

## 2023-02-11 VITALS — BP 118/68 | HR 71 | Ht 66.0 in | Wt 216.8 lb

## 2023-02-11 DIAGNOSIS — I428 Other cardiomyopathies: Secondary | ICD-10-CM

## 2023-02-11 DIAGNOSIS — I5022 Chronic systolic (congestive) heart failure: Secondary | ICD-10-CM | POA: Diagnosis not present

## 2023-02-11 DIAGNOSIS — I2510 Atherosclerotic heart disease of native coronary artery without angina pectoris: Secondary | ICD-10-CM | POA: Diagnosis not present

## 2023-02-11 DIAGNOSIS — Z95810 Presence of automatic (implantable) cardiac defibrillator: Secondary | ICD-10-CM

## 2023-02-11 DIAGNOSIS — I10 Essential (primary) hypertension: Secondary | ICD-10-CM

## 2023-02-11 DIAGNOSIS — E785 Hyperlipidemia, unspecified: Secondary | ICD-10-CM

## 2023-02-11 NOTE — Patient Instructions (Signed)
Medication Instructions:  Your physician recommends that you continue on your current medications as directed. Please refer to the Current Medication list given to you today.  *If you need a refill on your cardiac medications before your next appointment, please call your pharmacy*   Lab Work: Your provider would like for you to have following labs drawn today CBC, BMP, Lipid.   If you have labs (blood work) drawn today and your tests are completely normal, you will receive your results only by: MyChart Message (if you have MyChart) OR A paper copy in the mail If you have any lab test that is abnormal or we need to change your treatment, we will call you to review the results.   Testing/Procedures: none   Follow-Up: At [facility], you and your health needs are our priority.  As part of our continuing mission to provide you with exceptional heart care, we have created designated Provider Care Teams.  These Care Teams include your primary Cardiologist (physician) and Advanced Practice Providers (APPs -  Physician Assistants and Nurse Practitioners) who all work together to provide you with the care you need, when you need it.  We recommend signing up for the patient portal called "MyChart".  Sign up information is provided on this After Visit Summary.  MyChart is used to connect with patients for Virtual Visits (Telemedicine).  Patients are able to view lab/test results, encounter notes, upcoming appointments, etc.  Non-urgent messages can be sent to your provider as well.   To learn more about what you can do with MyChart, go to [email or web address].    Your next appointment:   6 month(s)  Provider:   Charlsie Quest, NP   Other Instructions Patient needs to follow up with EP in November

## 2023-02-11 NOTE — Progress Notes (Signed)
Cardiology Office Note:  .   Date:  02/11/2023  ID:  Evelyn Dunn, DOB 1950/12/29, MRN [id number] PCP: [facility]  [facility] Providers Cardiologist:  Yvonne Kendall, MD Electrophysiologist:  Lanier Prude, MD    History of Present Illness: Evelyn Dunn is a 72 y.o. female with a past medical history of nonobstructive coronary disease, CKD stage III, HFrEF, nonischemic cardiomyopathy, CAD, moderately severe MR, essential hypertension, hyperlipidemia, morbid obesity, chronic left bundle branch block, tobacco use, who is here in follow-up.  Previous left heart catheterization 11/2020 with left main normal, LAD 25%,  proximal LAD 50 to 65%, D2 with 30% stenosis, left circumflex 30% mid stenosis, OM1 moderate disease, RCA disease, RPDA 20%.  Nonischemic cardiomyopathy history of CAD and started on 10/16/2021.  Echocardiogram completed 11/26/2020 revealed EF 20 to 25%, global hypokinesis, moderately reduced right ventricular function, severely dilated left and right atrium, moderate to severe mitral valve regurgitation, mild aortic valve regurgitation.  Repeat echo 06/01/2021 revealed an EF of 25 to 30%, G1 DD, aortic valve regurgitation was mild, mild to moderate mitral valve regurgitation.  10/16/2021 she underwent BiV ICD insertion with a CRT-D HD.  02/27/2022 echo revealed EF 30-25%, global hypokinesis, G1 DD, mild to moderate mitral regurgitation, mild aortic regurgitation, and borderline dilatation of the ascending aorta measuring 30 mm.  She was last seen in clinic 07/10/22.  She denied any symptoms of chest pain or decompensation.  No changes made to her medications at today and no further testing that was ordered.  She returns to clinic today stating that she is feeling better than she has in quite some time.She has been taking all of her medications as prescribed. She denies any chest pain, shortness of breath, palpitations, or peripheral edema. She has been  working on dietary changes. She continues to complete her remote uploads of her device. Denies any hospitalizations or visits to the emergency department.   ROS: 10 point review of systems has been reviewed and considered negative with exception of what was in the HPI  Studies Reviewed: Marland Kitchen   EKG Interpretation Date/Time:  Monday February 11 2023 13:19:48 EDT Ventricular Rate:  71 PR Interval:  118 QRS Duration:  160 QT Interval:  484 QTC Calculation: 525 R Axis:   -20  Text Interpretation: Atrial-sensed ventricular-paced rhythm When compared with ECG of 17-Oct-2021 06:08, Confirmed by Charlsie Quest ([id number]) on 02/11/2023 1:28:34 PM   TTE 02/27/22  1. Left ventricular ejection fraction, by estimation, is 30 to 35%. The  left ventricle has moderately decreased function. The left ventricle  demonstrates global hypokinesis. The left ventricular internal cavity size  was mildly dilated. There is mild  left ventricular hypertrophy. Left ventricular diastolic parameters are  consistent with Grade I diastolic dysfunction (impaired relaxation). The  average left ventricular global longitudinal strain is -7.8 %.   2. Right ventricular systolic function is normal. The right ventricular  size is normal. There is normal pulmonary artery systolic pressure. The  estimated right ventricular systolic pressure is 23.1 mmHg.   3. The mitral valve is normal in structure. Mild to moderate mitral valve  regurgitation. No evidence of mitral stenosis.   4. The aortic valve was not well visualized. Aortic valve regurgitation  is mild. No aortic stenosis is present.   5. There is borderline dilatation of the ascending aorta, measuring 38  mm.   6. The inferior vena cava is normal in size with greater than 50%  respiratory  variability, suggesting right atrial pressure of 3 mmHg.   Risk Assessment/Calculations:             Physical Exam:   VS:  BP 118/68 (BP Location: Left Arm, Patient Position: Sitting)    Pulse 71   Ht 5\' 6"  (1.676 m)   Wt 216 lb 12.8 oz (98.3 kg)   LMP  (LMP Unknown)   SpO2 97%   BMI 34.99 kg/m    Wt Readings from Last 3 Encounters:  02/11/23 216 lb 12.8 oz (98.3 kg)  07/10/22 217 lb 6.4 oz (98.6 kg)  05/02/22 216 lb (98 kg)    GEN: Well nourished, well developed in no acute distress NECK: No JVD; No carotid bruits CARDIAC: RRR, no murmurs, rubs, gallops RESPIRATORY:  Clear to auscultation without rales, wheezing or rhonchi  ABDOMEN: Soft, non-tender, non-distended EXTREMITIES:  No edema; No deformity   ASSESSMENT AND PLAN: .   HFrEF with an LVEF 30-35% on echocardiogram in 02/2022.  Improved after she had her BiV ICD in situ.  She remains euvolemic on exam today.  Denies any shortness of breath or peripheral edema.  NYHA class I symptoms.  Denies any weight gain as well.  She is continued on carvedilol and Entresto without being able to escalate her GDMT due to kidney function.  She requires no refills today.   Coronary artery disease with right and left heart catheterization demonstrating moderate to severe single-vessel disease with 20 to 30% proximal LAD and 50 to 70% mid LAD stenosis.  She is stable today without anginal symptoms on exam.  EKG today revealed a sensed V paced less than any ischemic changes.  She is continued on secondary prevention with aspirin and rosuvastatin and carvedilol.  No further ischemic evaluation indicated at this time.  She is being sent for CBC and a BMP today.  Nonischemic cardiomyopathy/BiV ICD [facility] [employer] placed 5/23 by Dr. Lalla Brothers).  She is continued with her remote interrogation.  She has been scheduled with in person EP visit in October.  Primary hypertension with blood pressure 118/68.  Blood pressure is been very well-controlled.  She is continued on carvedilol and Entresto.  Encouraged to continue to take her blood pressure 1 to 2 hours post medication administration.  Mixed hyperlipidemia which she is continued on  rosuvastatin 10 mg daily.  She has been sent for a lipid panel today.  BMI 34.99 which has been stable.  She has been encouraged to continue with her increasing her activity and decrease in her dietary intake.       Dispo: Patient to return to clinic to see MD/APP in 6 months or sooner if needed  Signed, Symir Mah, NP

## 2023-02-12 LAB — LIPID PANEL
Chol/HDL Ratio: 2.8 ratio (ref 0.0–4.4)
Cholesterol, Total: 160 mg/dL (ref 100–199)
HDL: 57 mg/dL (ref >39)
LDL Chol Calc (NIH): 77 mg/dL (ref 0–99)
Triglycerides: 149 mg/dL (ref 0–149)
VLDL Cholesterol Cal: 26 mg/dL (ref 5–40)

## 2023-02-12 LAB — BASIC METABOLIC PANEL
BUN/Creatinine Ratio: 19 (ref 12–28)
BUN: 26 mg/dL (ref 8–27)
CO2: 17 mmol/L — ABNORMAL LOW (ref 20–29)
Calcium: 9.3 mg/dL (ref 8.7–10.3)
Chloride: 105 mmol/L (ref 96–106)
Creatinine, Ser: 1.39 mg/dL — ABNORMAL HIGH (ref 0.57–1.00)
Glucose: 154 mg/dL — ABNORMAL HIGH (ref 70–99)
Potassium: 4.3 mmol/L (ref 3.5–5.2)
Sodium: 139 mmol/L (ref 134–144)
eGFR: 41 mL/min/{1.73_m2} — ABNORMAL LOW (ref >59)

## 2023-02-12 LAB — CBC
Hematocrit: 39.7 % (ref 34.0–46.6)
Hemoglobin: 13.6 g/dL (ref 11.1–15.9)
MCH: 30.9 pg (ref 26.6–33.0)
MCHC: 34.3 g/dL (ref 31.5–35.7)
MCV: 90 fL (ref 79–97)
Platelets: 206 10*3/uL (ref 150–450)
RBC: 4.4 x10E6/uL (ref 3.77–5.28)
RDW: 12.4 % (ref 11.7–15.4)
WBC: 5.2 10*3/uL (ref 3.4–10.8)

## 2023-02-13 NOTE — Progress Notes (Signed)
Blood sugar slightly elevated.  Kidney function has remained stable.  Cholesterol has improved slightly.  Blood counts have remained stable with a slight drop noted from last year we will need to continue to monitor.

## 2023-04-16 ENCOUNTER — Ambulatory Visit (LOCAL_COMMUNITY_HEALTH_CENTER): Payer: Medicaid Other

## 2023-04-16 DIAGNOSIS — Z111 Encounter for screening for respiratory tuberculosis: Secondary | ICD-10-CM

## 2023-04-18 ENCOUNTER — Ambulatory Visit (INDEPENDENT_AMBULATORY_CARE_PROVIDER_SITE_OTHER): Payer: Medicare PPO

## 2023-04-18 DIAGNOSIS — I428 Other cardiomyopathies: Secondary | ICD-10-CM

## 2023-04-18 LAB — CUP PACEART REMOTE DEVICE CHECK
Battery Remaining Longevity: 126 mo
Battery Remaining Percentage: 100 %
Brady Statistic RA Percent Paced: 0 %
Brady Statistic RV Percent Paced: 100 %
Date Time Interrogation Session: 20241031005200
HighPow Impedance: 83 Ohm
Implantable Lead Connection Status: 753985
Implantable Lead Connection Status: 753985
Implantable Lead Connection Status: 753985
Implantable Lead Implant Date: 20230501
Implantable Lead Implant Date: 20230501
Implantable Lead Implant Date: 20230501
Implantable Lead Location: 753858
Implantable Lead Location: 753859
Implantable Lead Location: 753860
Implantable Lead Model: 672
Implantable Lead Model: 7841
Implantable Lead Model: 7842
Implantable Lead Serial Number: 1169613
Implantable Lead Serial Number: 1263550
Implantable Lead Serial Number: 185133
Implantable Pulse Generator Implant Date: 20230501
Lead Channel Impedance Value: 518 Ohm
Lead Channel Impedance Value: 634 Ohm
Lead Channel Impedance Value: 787 Ohm
Lead Channel Pacing Threshold Amplitude: 0.4 V
Lead Channel Pacing Threshold Amplitude: 0.5 V
Lead Channel Pacing Threshold Pulse Width: 0.4 ms
Lead Channel Pacing Threshold Pulse Width: 0.4 ms
Lead Channel Setting Pacing Amplitude: 0.1 V
Lead Channel Setting Pacing Amplitude: 1.5 V
Lead Channel Setting Pacing Amplitude: 2 V
Lead Channel Setting Pacing Pulse Width: 0.4 ms
Lead Channel Setting Pacing Pulse Width: 0.4 ms
Lead Channel Setting Sensing Sensitivity: 0.5 mV
Lead Channel Setting Sensing Sensitivity: 1 mV
Pulse Gen Serial Number: 511069

## 2023-04-19 ENCOUNTER — Ambulatory Visit (LOCAL_COMMUNITY_HEALTH_CENTER): Payer: Medicaid Other

## 2023-04-19 DIAGNOSIS — Z111 Encounter for screening for respiratory tuberculosis: Secondary | ICD-10-CM

## 2023-04-19 LAB — TB SKIN TEST
Induration: 0 mm
TB Skin Test: NEGATIVE

## 2023-04-30 ENCOUNTER — Ambulatory Visit: Payer: Medicare PPO | Attending: Cardiology | Admitting: Cardiology

## 2023-04-30 ENCOUNTER — Encounter: Payer: Self-pay | Admitting: Cardiology

## 2023-04-30 VITALS — BP 126/68 | HR 77 | Ht 66.0 in | Wt 225.0 lb

## 2023-04-30 DIAGNOSIS — I2510 Atherosclerotic heart disease of native coronary artery without angina pectoris: Secondary | ICD-10-CM

## 2023-04-30 DIAGNOSIS — I5022 Chronic systolic (congestive) heart failure: Secondary | ICD-10-CM | POA: Diagnosis not present

## 2023-04-30 DIAGNOSIS — I428 Other cardiomyopathies: Secondary | ICD-10-CM | POA: Diagnosis not present

## 2023-04-30 DIAGNOSIS — Z95810 Presence of automatic (implantable) cardiac defibrillator: Secondary | ICD-10-CM | POA: Diagnosis not present

## 2023-04-30 LAB — CUP PACEART INCLINIC DEVICE CHECK
Date Time Interrogation Session: 20241112165214
HighPow Impedance: 89 Ohm
Implantable Lead Connection Status: 753985
Implantable Lead Connection Status: 753985
Implantable Lead Connection Status: 753985
Implantable Lead Implant Date: 20230501
Implantable Lead Implant Date: 20230501
Implantable Lead Implant Date: 20230501
Implantable Lead Location: 753858
Implantable Lead Location: 753859
Implantable Lead Location: 753860
Implantable Lead Model: 672
Implantable Lead Model: 7841
Implantable Lead Model: 7842
Implantable Lead Serial Number: 1169613
Implantable Lead Serial Number: 1263550
Implantable Lead Serial Number: 185133
Implantable Pulse Generator Implant Date: 20230501
Lead Channel Impedance Value: 527 Ohm
Lead Channel Impedance Value: 631 Ohm
Lead Channel Impedance Value: 788 Ohm
Lead Channel Pacing Threshold Amplitude: 0.4 V
Lead Channel Pacing Threshold Amplitude: 0.6 V
Lead Channel Pacing Threshold Amplitude: 0.7 V
Lead Channel Pacing Threshold Pulse Width: 0.4 ms
Lead Channel Pacing Threshold Pulse Width: 0.4 ms
Lead Channel Pacing Threshold Pulse Width: 0.4 ms
Lead Channel Sensing Intrinsic Amplitude: 15.8 mV
Lead Channel Sensing Intrinsic Amplitude: 25 mV
Lead Channel Sensing Intrinsic Amplitude: 4.5 mV
Lead Channel Setting Pacing Amplitude: 0.1 V
Lead Channel Setting Pacing Amplitude: 1.5 V
Lead Channel Setting Pacing Amplitude: 5 V
Lead Channel Setting Pacing Pulse Width: 0.4 ms
Lead Channel Setting Pacing Pulse Width: 0.4 ms
Lead Channel Setting Sensing Sensitivity: 0.5 mV
Lead Channel Setting Sensing Sensitivity: 1 mV
Pulse Gen Serial Number: 511069

## 2023-04-30 NOTE — Patient Instructions (Signed)
Medication Instructions:  No changes  *If you need a refill on your cardiac medications before your next appointment, please call your pharmacy*   Lab Work: No labwork If you have labs (blood work) drawn today and your tests are completely normal, you will receive your results only by: MyChart Message (if you have MyChart) OR A paper copy in the mail If you have any lab test that is abnormal or we need to change your treatment, we will call you to review the results.   Testing/Procedures: None   Follow-Up: At [facility], you and your health needs are our priority.  As part of our continuing mission to provide you with exceptional heart care, we have created designated Provider Care Teams.  These Care Teams include your primary Cardiologist (physician) and Advanced Practice Providers (APPs -  Physician Assistants and Nurse Practitioners) who all work together to provide you with the care you need, when you need it.  We recommend signing up for the patient portal called "MyChart".  Sign up information is provided on this After Visit Summary.  MyChart is used to connect with patients for Virtual Visits (Telemedicine).  Patients are able to view lab/test results, encounter notes, upcoming appointments, etc.  Non-urgent messages can be sent to your provider as well.   To learn more about what you can do with MyChart, go to [email or web address].    Your next appointment:   6 month(s)  Provider:   Steffanie Dunn, MD or Sherie Don, NP    Follow-up in 3 months with Dr. Okey Dupre or general cardiology APP Other Instructions

## 2023-04-30 NOTE — Progress Notes (Signed)
Cardiology Office Note Date:  04/30/2023  Patient ID:  Evelyn, Dunn 1950-12-29, MRN [id number] PCP:  [facility]  Cardiologist:  Yvonne Kendall, MD Electrophysiologist: Lanier Prude, MD    Chief Complaint: 1 year device follow-up  History of Present Illness: Evelyn Dunn is a 72 y.o. female with PMH notable for HFrEF, NICM, s/p CRT-D, CAD, HTN, CKD stage III, mod-severe MR, tobacco use; seen today for Lanier Prude, MD for routine electrophysiology followup.  She had CRT-D implanted 10/2021. Her LV lead is in the left bundle position d/t tortuous coronary sinus.  She recently saw NP Hammock 01/2023 and was doing well with good energy. Euvolemic  On follow-up today, she feels better than she has in many years. Denies chest pain, chest pressure. No SOB or edema. Has good appetite.  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     Device Information: Bos Sci CRT-D, imp 10/2021; dx NICM, CHF   Past Medical History:  Diagnosis Date   Chronic HFrEF (heart failure with reduced ejection fraction) (HCC)    a. 11/2020 Echo: EF 20-25%, glob HK. Nl RV size/fxn, sev BAE. Mod-sev MR. Mild AI; b. 11/2020 Cath: CO/CI 3.6/1.9 respectively.   CKD (chronic kidney disease), stage III (HCC)    Hypertension    a. 11/2020 Renal Duplex: No high grade RAS.   LBBB (left bundle branch block)    Moderate to severe mitral regurgitation    NICM (nonischemic cardiomyopathy) (HCC)    a. 11/2020 Echo: EF 20-25%, glob HK; b. 11/2020 Cath: nonobs dzs.   Nonobstructive CAD (coronary artery disease)    a. 11/2020 Cath: LM nl, LAD 25p, 65/18m, D2 30, LCX 43m, OM1 mod dzs, RCA mild diff dzs, RPDA 20-->Med rx.    Past Surgical History:  Procedure Laterality Date   BIV ICD INSERTION CRT-D N/A 10/16/2021   Procedure: BIV ICD INSERTION CRT-D;  Surgeon: Lanier Prude, MD;  Location: [facility] INVASIVE CV LAB;   Service: Cardiovascular;  Laterality: N/A;   RIGHT/LEFT HEART CATH AND CORONARY ANGIOGRAPHY N/A 12/05/2020   Procedure: RIGHT/LEFT HEART CATH AND CORONARY ANGIOGRAPHY;  Surgeon: Yvonne Kendall, MD;  Location: [facility] INVASIVE CV LAB;  Service: Cardiovascular;  Laterality: N/A;    Current Outpatient Medications  Medication Instructions   acetaminophen (TYLENOL) 325-650 mg, Oral, Every 4 hours PRN   aspirin 81 mg, Oral, Daily   carvedilol (COREG) 3.125 mg, Oral, 2 times daily   diphenhydrAMINE (BENADRYL) 50 mg, Oral, At bedtime PRN   ENTRESTO 24-26 MG 1 tablet, Oral, 2 times daily   famotidine (PEPCID) 40 mg, Oral, Daily   rosuvastatin (CRESTOR) 10 mg, Oral, Daily    Social History:  The patient  reports that she has been smoking cigarettes. She has never used smokeless tobacco. She reports that she does not currently use alcohol after a past usage of about 1.0 standard drink of alcohol per week. She reports that she does not use drugs.   Family History:  The patient's family history includes Coronary artery disease in her sister; Heart failure in her father; Valvular heart disease in her mother; Vascular Disease in her brother.  ROS:  Please see the history of present illness. All other systems are reviewed and otherwise negative.   PHYSICAL EXAM:  VS:  BP 126/68 (BP Location: Left Arm, Patient Position: Sitting, Cuff Size: Normal)   Pulse  77   Ht 5\' 6"  (1.676 m)   Wt 225 lb (102.1 kg)   LMP  (LMP Unknown)   BMI 36.32 kg/m  BMI: Body mass index is 36.32 kg/m.  GEN- The patient is well appearing, alert and oriented x 3 today.   Lungs- Clear to ausculation bilaterally, normal work of breathing.  Heart- Regular rate and rhythm, no murmurs, rubs or gallops Extremities- No peripheral edema, warm, dry Skin-   device pocket well-healed, no tethering   Device interrogation done today and reviewed by myself:  Battery 10.5 years Lead thresholds, impedence, sensing stable  No  episodes 100% VP No changes made today  EKG is ordered. Personal review of EKG from today shows:    EKG Interpretation Date/Time:  Tuesday April 30 2023 13:59:15 EST Ventricular Rate:  77 PR Interval:    QRS Duration:  186 QT Interval:  472 QTC Calculation: 534 R Axis:   -6  Text Interpretation: Ventricular-paced rhythm Confirmed by Sherie Don [id number]) on 04/30/2023 2:17:14 PM    Recent Labs: 02/11/2023: BUN 26; Creatinine, Ser 1.39; Hemoglobin 13.6; Platelets 206; Potassium 4.3; Sodium 139  02/11/2023: Chol/HDL Ratio 2.8; Cholesterol, Total 160; HDL 57; LDL Chol Calc (NIH) 77; Triglycerides 149   CrCl cannot be calculated (Patient's most recent lab result is older than the maximum 21 days allowed.).   Wt Readings from Last 3 Encounters:  04/30/23 225 lb (102.1 kg)  02/11/23 216 lb 12.8 oz (98.3 kg)  07/10/22 217 lb 6.4 oz (98.6 kg)     Additional studies reviewed include: Previous EP, cardiology notes.   TTE, 02/27/2022  1. Left ventricular ejection fraction, by estimation, is 30 to 35%. The left ventricle has moderately decreased function. The left ventricle demonstrates global hypokinesis. The left ventricular internal cavity size was mildly dilated. There is mild left ventricular hypertrophy. Left ventricular diastolic parameters are consistent with Grade I diastolic dysfunction (impaired relaxation). The average left ventricular global longitudinal strain is -7.8 %.   2. Right ventricular systolic function is normal. The right ventricular size is normal. There is normal pulmonary artery systolic pressure. The estimated right ventricular systolic pressure is 23.1 mmHg.   3. The mitral valve is normal in structure. Mild to moderate mitral valve regurgitation. No evidence of mitral stenosis.   4. The aortic valve was not well visualized. Aortic valve regurgitation is mild. No aortic stenosis is present.   5. There is borderline dilatation of the ascending aorta, measuring 38  mm.   6. The inferior vena cava is normal in size with greater than 50% respiratory variability, suggesting right atrial pressure of 3 mmHg.   Comparison(s): 06/01/21-EF25-30% with moderately leaky valve. Pump function is still low.   TTE, 06/01/2021  1. Left ventricular ejection fraction, by estimation, is 25 to 30%. Left ventricular ejection fraction by 2D MOD biplane is 30.0 %. Left ventricular ejection fraction by PLAX is 28 %. The left ventricle has severely decreased function. The left ventricle demonstrates global hypokinesis. There is mild left ventricular hypertrophy. Left ventricular diastolic parameters are consistent with Grade I diastolic dysfunction (impaired relaxation).   2. Right ventricular systolic function is low normal. The right ventricular size is normal.   3. The mitral valve is normal in structure. Mild to moderate mitral valve regurgitation.   4. The aortic valve is tricuspid. Aortic valve regurgitation is mild.   5. The inferior vena cava is normal in size with greater than 50% respiratory variability, suggesting right atrial pressure of 3 mmHg.  Comparison(s): LVEF 20-25%, Moderate MR.        ASSESSMENT AND PLAN:  #) CRT-D Device functioning well, see paceart for details 100% VP   #) HFrEF NYHA I-II symptoms Euvolemic with good activity tolerance GDMT: coreg 3.125mg  BID, entresto No diuretic        Current medicines are reviewed at length with the patient today.   The patient does not have concerns regarding her medicines.  The following changes were made today:  none  Labs/ tests ordered today include:  Orders Placed This Encounter  Procedures   EKG 12-Lead     Disposition: Follow up with Dr. Lalla Brothers or EP APP in 6 months   Signed, Sherie Don, NP  04/30/23  4:51 PM  Electrophysiology [facility]

## 2023-05-01 NOTE — Progress Notes (Signed)
Remote ICD transmission.   

## 2023-05-08 ENCOUNTER — Other Ambulatory Visit: Payer: Self-pay | Admitting: Cardiology

## 2023-05-08 ENCOUNTER — Other Ambulatory Visit: Payer: Self-pay | Admitting: Internal Medicine

## 2023-07-18 ENCOUNTER — Ambulatory Visit: Payer: Medicare PPO

## 2023-07-18 DIAGNOSIS — I428 Other cardiomyopathies: Secondary | ICD-10-CM

## 2023-07-18 LAB — CUP PACEART REMOTE DEVICE CHECK
Battery Remaining Longevity: 126 mo
Battery Remaining Percentage: 100 %
Brady Statistic RA Percent Paced: 0 %
Brady Statistic RV Percent Paced: 100 %
Date Time Interrogation Session: 20250130005100
HighPow Impedance: 84 Ohm
Implantable Lead Connection Status: 753985
Implantable Lead Connection Status: 753985
Implantable Lead Connection Status: 753985
Implantable Lead Implant Date: 20230501
Implantable Lead Implant Date: 20230501
Implantable Lead Implant Date: 20230501
Implantable Lead Location: 753858
Implantable Lead Location: 753859
Implantable Lead Location: 753860
Implantable Lead Model: 672
Implantable Lead Model: 7841
Implantable Lead Model: 7842
Implantable Lead Serial Number: 1169613
Implantable Lead Serial Number: 1263550
Implantable Lead Serial Number: 185133
Implantable Pulse Generator Implant Date: 20230501
Lead Channel Impedance Value: 507 Ohm
Lead Channel Impedance Value: 580 Ohm
Lead Channel Impedance Value: 707 Ohm
Lead Channel Pacing Threshold Amplitude: 0.4 V
Lead Channel Pacing Threshold Amplitude: 0.5 V
Lead Channel Pacing Threshold Pulse Width: 0.4 ms
Lead Channel Pacing Threshold Pulse Width: 0.4 ms
Lead Channel Setting Pacing Amplitude: 0.1 V
Lead Channel Setting Pacing Amplitude: 1.5 V
Lead Channel Setting Pacing Amplitude: 2 V
Lead Channel Setting Pacing Pulse Width: 0.4 ms
Lead Channel Setting Pacing Pulse Width: 0.4 ms
Lead Channel Setting Sensing Sensitivity: 0.5 mV
Lead Channel Setting Sensing Sensitivity: 1 mV
Pulse Gen Serial Number: 511069

## 2023-08-08 ENCOUNTER — Encounter: Payer: Self-pay | Admitting: *Deleted

## 2023-08-08 ENCOUNTER — Other Ambulatory Visit: Payer: Self-pay | Admitting: Internal Medicine

## 2023-08-09 ENCOUNTER — Other Ambulatory Visit: Payer: Self-pay | Admitting: Cardiology

## 2023-08-09 ENCOUNTER — Other Ambulatory Visit: Payer: Self-pay | Admitting: Internal Medicine

## 2023-08-12 ENCOUNTER — Ambulatory Visit: Payer: Medicare PPO | Attending: Internal Medicine | Admitting: Internal Medicine

## 2023-08-12 ENCOUNTER — Encounter: Payer: Self-pay | Admitting: Internal Medicine

## 2023-08-12 VITALS — BP 130/72 | HR 79 | Ht 66.0 in | Wt 225.6 lb

## 2023-08-12 DIAGNOSIS — I2510 Atherosclerotic heart disease of native coronary artery without angina pectoris: Secondary | ICD-10-CM

## 2023-08-12 DIAGNOSIS — I5022 Chronic systolic (congestive) heart failure: Secondary | ICD-10-CM | POA: Diagnosis not present

## 2023-08-12 DIAGNOSIS — I10 Essential (primary) hypertension: Secondary | ICD-10-CM | POA: Diagnosis not present

## 2023-08-12 DIAGNOSIS — I428 Other cardiomyopathies: Secondary | ICD-10-CM | POA: Diagnosis not present

## 2023-08-12 MED ORDER — CARVEDILOL 6.25 MG PO TABS: 3.1250 mg | ORAL_TABLET | Freq: Two times a day (BID) | ORAL | 3 refills | Status: DC

## 2023-08-12 MED ORDER — SACUBITRIL-VALSARTAN 24-26 MG PO TABS: 1.0000 | ORAL_TABLET | Freq: Two times a day (BID) | ORAL | 3 refills | Status: DC

## 2023-08-12 MED ORDER — ROSUVASTATIN CALCIUM 10 MG PO TABS: 10.0000 mg | ORAL_TABLET | Freq: Every day | ORAL | 3 refills | Status: DC

## 2023-08-12 NOTE — Progress Notes (Unsigned)
 Cardiology Office Note:  .   Date:  08/14/2023  ID:  Evelyn Dunn, DOB 1951/02/18, MRN [id number] PCP: Leanna Sato, MD  [facility] Providers Cardiologist:  Yvonne Kendall, MD Electrophysiologist:  Lanier Prude, MD     History of Present Illness: Evelyn Dunn is a 73 y.o. female nonobstructive coronary artery disease, chronic HFrEF due to nonischemic cardiomyopathy with LBBB status post CRT-D, moderate mitral regurgitation, hypertension, hyperlipidemia, chronic kidney disease, and morbid obesity, who presents for follow-up of heart failure and CAD.  She was last seen in our office in 01/2023 by Charlsie Quest, NP, at which time she was feeling fairly well.  No medication changes or additional testing were pursued.  She was seen in the EP clinic by Sherie Don, NP, in 04/2023, at which time she continued to feel well.  Today, Evelyn Dunn reports that she continues to feel well.  She notices a rare twinge of pain in her chest that is transient and not related to any particular activity.  She has not had any shortness of breath, palpitations, lightheadedness, or edema.  Home blood pressure readings are typically normal.  She is tolerating her medications well.  ROS: See HPI  Studies Reviewed: Marland Kitchen   EKG Interpretation Date/Time:  Monday August 12 2023 13:50:25 EST Ventricular Rate:  79 PR Interval:    QRS Duration:  182 QT Interval:  460 QTC Calculation: 527 R Axis:   -18  Text Interpretation: Ventricular-paced rhythm When compared with ECG of 30-Apr-2023 13:59, Vent. rate has increased BY   2 BPM Confirmed by Jahree Dermody [phone]) on 08/14/2023 6:21:32 AM    TTE (02/27/2022): Mildly dilated LV with mild LVH.  LVEF 30-35% with global hypokinesis and grade 1 diastolic dysfunction.  Global longitudinal strain -7.8%.  Normal RV size and function.  Normal PA pressure.  Normal biatrial size.  No pericardial effusion.  Mild to moderate mitral regurgitation.  Mild tricuspid  regurgitation.  Mild aortic regurgitation.  Borderline dilated aortic root (3.8 cm).  Normal CVP.  Risk Assessment/Calculations:         Physical Exam:   VS:  BP 130/72 (BP Location: Left Arm, Patient Position: Sitting, Cuff Size: Normal)   Pulse 79   Ht 5\' 6"  (1.676 m)   Wt 225 lb 9.6 oz (102.3 kg)   LMP  (LMP Unknown)   SpO2 96%   BMI 36.41 kg/m    Wt Readings from Last 3 Encounters:  08/12/23 225 lb 9.6 oz (102.3 kg)  04/30/23 225 lb (102.1 kg)  02/11/23 216 lb 12.8 oz (98.3 kg)    General:  NAD. Neck: No JVD or HJR. Lungs: Clear to auscultation bilaterally without wheezes or crackles. Heart: Regular rate and rhythm without murmurs, rubs, or gallops. Abdomen: Soft, nontender, nondistended. Extremities: No lower extremity edema.  ASSESSMENT AND PLAN: .    Chronic HFrEF due to nonischemic cardiomyopathy: Evelyn Dunn appears euvolemic with stable NYHA class I-II symptoms.  Given that her blood pressure is borderline elevated today, we will use this opportunity to escalate her GDMT.  I will increase carvedilol to 6.25 mg twice daily and continue current dose of Entresto.  Further up titration of carvedilol/Entresto should be continued at follow-up as well as stepwise addition of aldosterone antagonist and SGLT2 inhibitor.  Once her GDMT has been optimized, repeat echocardiogram should be performed to reassess her LV function.  Continue ongoing CRT follow-up through the device clinic.  Nonobstructive coronary artery disease: Sporadic  twinges of chest pain but no frank angina reported.  Increase carvedilol, as above and continue aspirin and rosuvastatin for secondary prevention.  LDL just above goal on last check in 01/2023 at 77.  Continue rosuvastatin 10 mg daily with ongoing lifestyle modifications.  If LDL remains above 70 at upcoming check with PCP, further escalation of rosuvastatin will need to be considered.  Hypertension: Blood pressure borderline today.  As above, will increase  carvedilol to 6.25 mg twice daily.  Continue current dose of Entresto.    Dispo: Return to clinic in 6 weeks.  Signed, Yvonne Kendall, MD

## 2023-08-12 NOTE — Patient Instructions (Signed)
 Medication Instructions:  Please increase Carvedilol to 6.25 MG twice daily.  *If you need a refill on your cardiac medications before your next appointment, please call your pharmacy*   Lab Work: None ordered.  If you have labs (blood work) drawn today and your tests are completely normal, you will receive your results only by: MyChart Message (if you have MyChart) OR A paper copy in the mail If you have any lab test that is abnormal or we need to change your treatment, we will call you to review the results.   Testing/Procedures: None ordered.    Follow-Up: At [facility], you and your health needs are our priority.  As part of our continuing mission to provide you with exceptional heart care, we have created designated Provider Care Teams.  These Care Teams include your primary Cardiologist (physician) and Advanced Practice Providers (APPs -  Physician Assistants and Nurse Practitioners) who all work together to provide you with the care you need, when you need it.  We recommend signing up for the patient portal called "MyChart".  Sign up information is provided on this After Visit Summary.  MyChart is used to connect with patients for Virtual Visits (Telemedicine).  Patients are able to view lab/test results, encounter notes, upcoming appointments, etc.  Non-urgent messages can be sent to your provider as well.   To learn more about what you can do with MyChart, go to [email or web address].    Your next appointment:   6 week(s)  Provider:   You may see Yvonne Kendall, MD or one of the following Advanced Practice Providers on your designated Care Team:   Nicolasa Ducking, NP Eula Listen, PA-C Cadence Fransico Michael, PA-C Charlsie Quest, NP Carlos Levering, NP

## 2023-08-14 ENCOUNTER — Encounter: Payer: Self-pay | Admitting: Internal Medicine

## 2023-08-14 ENCOUNTER — Telehealth: Payer: Self-pay

## 2023-08-14 MED ORDER — CARVEDILOL 6.25 MG PO TABS: 6.2500 mg | ORAL_TABLET | Freq: Two times a day (BID) | ORAL | 3 refills | Status: DC

## 2023-08-14 NOTE — Telephone Encounter (Signed)
 Per the last office visit, the patient was instructed to increase carvedilol to 6.25 mg BID. However, the prescription indicated taking 1/2 tablet BID. The patient confirmed that she has been taking a whole tablet BID. Nurse will update the prescription and send it to the pharmacy.

## 2023-08-23 NOTE — Progress Notes (Signed)
 Remote ICD transmission.

## 2023-09-24 ENCOUNTER — Encounter: Payer: Self-pay | Admitting: Cardiology

## 2023-09-24 ENCOUNTER — Ambulatory Visit: Payer: Medicare PPO | Attending: Cardiology | Admitting: Cardiology

## 2023-09-24 VITALS — BP 114/72 | HR 65 | Ht 66.0 in | Wt 229.4 lb

## 2023-09-24 DIAGNOSIS — I10 Essential (primary) hypertension: Secondary | ICD-10-CM

## 2023-09-24 DIAGNOSIS — I2510 Atherosclerotic heart disease of native coronary artery without angina pectoris: Secondary | ICD-10-CM | POA: Diagnosis not present

## 2023-09-24 DIAGNOSIS — Z720 Tobacco use: Secondary | ICD-10-CM

## 2023-09-24 DIAGNOSIS — I5022 Chronic systolic (congestive) heart failure: Secondary | ICD-10-CM

## 2023-09-24 DIAGNOSIS — Z95810 Presence of automatic (implantable) cardiac defibrillator: Secondary | ICD-10-CM

## 2023-09-24 DIAGNOSIS — Z79899 Other long term (current) drug therapy: Secondary | ICD-10-CM

## 2023-09-24 DIAGNOSIS — E785 Hyperlipidemia, unspecified: Secondary | ICD-10-CM | POA: Diagnosis not present

## 2023-09-24 DIAGNOSIS — I428 Other cardiomyopathies: Secondary | ICD-10-CM | POA: Diagnosis not present

## 2023-09-24 MED ORDER — ENTRESTO 49-51 MG PO TABS: 1.0000 | ORAL_TABLET | Freq: Two times a day (BID) | ORAL | 3 refills | Status: DC

## 2023-09-24 NOTE — Patient Instructions (Signed)
 Medication Instructions:  Your physician has recommended you make the following change in your medication:   INCREASE Entresto to 49-51 mg twice a day  *If you need a refill on your cardiac medications before your next appointment, please call your pharmacy*  Lab Work: CBC, CMET, DLDL today.   In 2-3 weeks we would like you to come in for BMET in our office. No appointment is needed. Hours are 8:00 am to 4:00 pm and they are closed from 1-2 for lunch.    If you have labs (blood work) drawn today and your tests are completely normal, you will receive your results only by: MyChart Message (if you have MyChart) OR A paper copy in the mail If you have any lab test that is abnormal or we need to change your treatment, we will call you to review the results.  Testing/Procedures: None  Follow-Up: At [facility], you and your health needs are our priority.  As part of our continuing mission to provide you with exceptional heart care, our providers are all part of one team.  This team includes your primary Cardiologist (physician) and Advanced Practice Providers or APPs (Physician Assistants and Nurse Practitioners) who all work together to provide you with the care you need, when you need it.  Your next appointment:   3 month(s)  Provider:   Yvonne Kendall, MD or Charlsie Quest, NP    We recommend signing up for the patient portal called "MyChart".  Sign up information is provided on this After Visit Summary.  MyChart is used to connect with patients for Virtual Visits (Telemedicine).  Patients are able to view lab/test results, encounter notes, upcoming appointments, etc.  Non-urgent messages can be sent to your provider as well.   To learn more about what you can do with MyChart, go to [email or web address].

## 2023-09-24 NOTE — Progress Notes (Signed)
 Cardiology Office Note:  .   Date:  09/24/2023  ID:  Evelyn Dunn, DOB 1950-11-28, MRN [id number] PCP: Leanna Sato, MD  [facility] Providers Cardiologist:  Yvonne Kendall, MD Electrophysiologist:  Lanier Prude, MD    History of Present Illness: Evelyn Dunn is a 73 y.o. female with past medical history of nonobstructive coronary artery disease, CKD stage III, HFrEF, nonischemic cardiomyopathy, moderately severe MR, essential hypertension, hyperlipidemia, morbid obesity, chronic left bundle-branch block, tobacco use, who is here today for follow-up of her coronary artery disease.   Previous left heart catheterization 11/2020 with left main normal, LAD 25%,  proximal LAD 50 to 65%, D2 with 30% stenosis, left circumflex 30% mid stenosis, OM1 moderate disease, RCA disease, RPDA 20%.  Nonischemic cardiomyopathy history of CAD and started on 10/16/2021.  Echocardiogram completed 11/26/2020 revealed EF 20 to 25%, global hypokinesis, moderately reduced right ventricular function, severely dilated left and right atrium, moderate to severe mitral valve regurgitation, mild aortic valve regurgitation.  Repeat echo 06/01/2021 revealed an EF of 25 to 30%, G1 DD, aortic valve regurgitation was mild, mild to moderate mitral valve regurgitation.  10/16/2021 she underwent BiV ICD insertion with a CRT-D HD.  02/27/2022 echo revealed EF 30-25%, global hypokinesis, G1 DD, mild to moderate mitral regurgitation, mild aortic regurgitation, and borderline dilatation of the ascending aorta measuring 30 mm.   She was last seen in clinic 08/08/2023 by Dr. Okey Dupre.  At that time she continued to feel well.  She noticed a rare twinge of pain in her chest that was transient and not related to any particular activity.  She denied any shortness of breath, palpitations, lightheadedness.  Home blood pressure readings are typically normal.  She was tolerating her medications well.  Her carvedilol was increased to 6.25 mg  twice daily.  Recommendation was to continue up titration of carvedilol/Entresto and follow-up as well as stepwise addition of MRA and SGLT2 inhibitor.  Once GDMT has been optimized repeat echocardiogram should be performed.  She returns to clinic today stating that she has been doing well from a cardiac perspective.  She denies any shortness of breath, chest pain, palpitations, or peripheral edema.  She had recently had a carvedilol increased and is tolerating it well.  She states that she has been compliant with her current medication regimen without any adverse side effects.  States that she has decreased with her smoking she is only smoking 2 cigarettes/day at this time.  Unfortunately she has not had any follow-up with her PCP and her last labs were completed in August.  She denies any hospitalizations or visits to the emergency department.   ROS: 10 point review of systems has been reviewed and considered negative except ones were listed in the HPI  Studies Reviewed: Marland Kitchen        TTE 02/27/22  1. Left ventricular ejection fraction, by estimation, is 30 to 35%. The  left ventricle has moderately decreased function. The left ventricle  demonstrates global hypokinesis. The left ventricular internal cavity size  was mildly dilated. There is mild  left ventricular hypertrophy. Left ventricular diastolic parameters are  consistent with Grade I diastolic dysfunction (impaired relaxation). The  average left ventricular global longitudinal strain is -7.8 %.   2. Right ventricular systolic function is normal. The right ventricular  size is normal. There is normal pulmonary artery systolic pressure. The  estimated right ventricular systolic pressure is 23.1 mmHg.   3. The mitral valve is  normal in structure. Mild to moderate mitral valve  regurgitation. No evidence of mitral stenosis.   4. The aortic valve was not well visualized. Aortic valve regurgitation  is mild. No aortic stenosis is present.   5.  There is borderline dilatation of the ascending aorta, measuring 38  mm.   6. The inferior vena cava is normal in size with greater than 50%  respiratory variability, suggesting right atrial pressure of 3 mmHg.    Risk Assessment/Calculations:             Physical Exam:   VS:  BP 114/72   Pulse 65   Ht 5\' 6"  (1.676 m)   Wt 229 lb 6.4 oz (104.1 kg)   LMP  (LMP Unknown)   SpO2 96%   BMI 37.03 kg/m    Wt Readings from Last 3 Encounters:  09/24/23 229 lb 6.4 oz (104.1 kg)  08/12/23 225 lb 9.6 oz (102.3 kg)  04/30/23 225 lb (102.1 kg)    GEN: Well nourished, well developed in no acute distress NECK: No JVD; No carotid bruits CARDIAC: RRR, no murmurs, rubs, gallops RESPIRATORY:  Clear to auscultation without rales, wheezing or rhonchi  ABDOMEN: Soft, non-tender, non-distended EXTREMITIES:  No edema; No deformity   ASSESSMENT AND PLAN: .   HFrEF/nonischemic cardiomyopathy with an LVEF of 30 to 35% on echocardiogram 02/2022.  Improved after she had BiV ICD placement.  She remains euvolemic on exam today suffer from NYHA class I symptoms.  Denies any shortness of breath or dyspnea on exertion.  She is continued on carvedilol 6.25 mg twice daily and Entresto 24/26 mg twice daily.  She has been sent for updated labs today to reevaluate kidney function.  If kidney function remains stable she will increase her Entresto to 49/51 mg twice daily with repeat BMP in 2 to 3 weeks.  She is not an ideal candidate for MRA therapy due to elevated kidney function and is concerned of initiating SGLT2 inhibitor therapy secondary to cost.  After we have escalated GDMT as tolerated by blood pressure and kidney function she will need an updated echocardiogram completed in approximately 3 months.  Coronary disease with radiologic catheterization demonstrating moderate to severe single-vessel disease with moderate to severe single-vessel disease with 20-30% proximal LAD and 50 to 70% mid LAD stenosis.  She is  stable today without complaints of angina or anginal equivalents.  She is continued on aspirin 81 mg daily and rosuvastatin 10 mg daily.  She has been sent for an updated CBC and CMP today.  No further ischemic testing is needing at this time.  Nonischemic cardiomyopathy/history of BiV ICD ([employer] placed 5/23 by Dr. Lalla Brothers) she continues with her remote interrogations.  Has been encouraged to continue outpatient follow-ups with the EP.  Primary hypertension with blood pressure today 114/72.  Blood pressures remain stable.  She is continue carvedilol 6.25 mg twice daily and Entresto 24/26 mg twice daily until labs have returned and she will increase her dose to 49/51 mg twice daily.  She has been encouraged to continue to monitor her blood pressures at home 1 to 2 hours postmedication administration as well.  Mixed hyperlipidemia where she has been continued on rosuvastatin 10 mg daily.  She has been sent for an updated lipid panel today.  Obesity with a BMI of 37.03.  She has been encouraged to continue with increasing her activity and incorporate a heart healthy diet.  Tobacco abuse where she continues to smoke approximately 2 cigarettes/day.  Total cessation is recommended but she has been congratulated on continuing to decrease the amount that she smokes daily.       Dispo: Patient return to clinic MD/APP in 3 months or sooner if needed for evaluation of symptoms.  Signed, Damain Broadus, NP

## 2023-09-25 LAB — COMPREHENSIVE METABOLIC PANEL WITH GFR
ALT: 10 IU/L (ref 0–32)
AST: 17 IU/L (ref 0–40)
Albumin: 4.3 g/dL (ref 3.8–4.8)
Alkaline Phosphatase: 64 IU/L (ref 44–121)
BUN/Creatinine Ratio: 14 (ref 12–28)
BUN: 18 mg/dL (ref 8–27)
Bilirubin Total: 0.4 mg/dL (ref 0.0–1.2)
CO2: 21 mmol/L (ref 20–29)
Calcium: 9.1 mg/dL (ref 8.7–10.3)
Chloride: 104 mmol/L (ref 96–106)
Creatinine, Ser: 1.3 mg/dL — ABNORMAL HIGH (ref 0.57–1.00)
Globulin, Total: 2.7 g/dL (ref 1.5–4.5)
Glucose: 158 mg/dL — ABNORMAL HIGH (ref 70–99)
Potassium: 4.2 mmol/L (ref 3.5–5.2)
Sodium: 139 mmol/L (ref 134–144)
Total Protein: 7 g/dL (ref 6.0–8.5)
eGFR: 44 mL/min/{1.73_m2} — ABNORMAL LOW (ref >59)

## 2023-09-25 LAB — CBC
Hematocrit: 42.1 % (ref 34.0–46.6)
Hemoglobin: 14 g/dL (ref 11.1–15.9)
MCH: 31.4 pg (ref 26.6–33.0)
MCHC: 33.3 g/dL (ref 31.5–35.7)
MCV: 94 fL (ref 79–97)
Platelets: 195 10*3/uL (ref 150–450)
RBC: 4.46 x10E6/uL (ref 3.77–5.28)
RDW: 12.5 % (ref 11.7–15.4)
WBC: 5.4 10*3/uL (ref 3.4–10.8)

## 2023-09-25 LAB — LDL CHOLESTEROL, DIRECT: LDL Direct: 72 mg/dL (ref 0–99)

## 2023-09-25 NOTE — Progress Notes (Signed)
 Kidney function remains stable.  No concerns for bleeding with stable blood counts.  No elevated white count concerning for infection.  Direct LDL at 72 close to goal of 70 or less.  Continue current medication regimen without any further changes needed at this time.

## 2023-10-15 LAB — BASIC METABOLIC PANEL WITH GFR
BUN/Creatinine Ratio: 18 (ref 12–28)
BUN: 25 mg/dL (ref 8–27)
CO2: 16 mmol/L — ABNORMAL LOW (ref 20–29)
Calcium: 9.6 mg/dL (ref 8.7–10.3)
Chloride: 106 mmol/L (ref 96–106)
Creatinine, Ser: 1.38 mg/dL — ABNORMAL HIGH (ref 0.57–1.00)
Glucose: 121 mg/dL — ABNORMAL HIGH (ref 70–99)
Potassium: 5.2 mmol/L (ref 3.5–5.2)
Sodium: 140 mmol/L (ref 134–144)
eGFR: 41 mL/min/{1.73_m2} — ABNORMAL LOW (ref >59)

## 2023-10-15 NOTE — Progress Notes (Signed)
 Kidney function is slightly elevated but continues to remain stable.  No further changes needed at this time.

## 2023-10-16 ENCOUNTER — Encounter: Payer: Self-pay | Admitting: *Deleted

## 2023-10-17 ENCOUNTER — Ambulatory Visit (INDEPENDENT_AMBULATORY_CARE_PROVIDER_SITE_OTHER): Payer: Medicare PPO

## 2023-10-17 DIAGNOSIS — I428 Other cardiomyopathies: Secondary | ICD-10-CM | POA: Diagnosis not present

## 2023-10-17 LAB — CUP PACEART REMOTE DEVICE CHECK
Battery Remaining Longevity: 126 mo
Battery Remaining Percentage: 100 %
Brady Statistic RA Percent Paced: 0 %
Brady Statistic RV Percent Paced: 100 %
Date Time Interrogation Session: 20250501011600
HighPow Impedance: 87 Ohm
Implantable Lead Connection Status: 753985
Implantable Lead Connection Status: 753985
Implantable Lead Connection Status: 753985
Implantable Lead Implant Date: 20230501
Implantable Lead Implant Date: 20230501
Implantable Lead Implant Date: 20230501
Implantable Lead Location: 753858
Implantable Lead Location: 753859
Implantable Lead Location: 753860
Implantable Lead Model: 672
Implantable Lead Model: 7841
Implantable Lead Model: 7842
Implantable Lead Serial Number: 1169613
Implantable Lead Serial Number: 1263550
Implantable Lead Serial Number: 185133
Implantable Pulse Generator Implant Date: 20230501
Lead Channel Impedance Value: 541 Ohm
Lead Channel Impedance Value: 628 Ohm
Lead Channel Impedance Value: 777 Ohm
Lead Channel Pacing Threshold Amplitude: 0.4 V
Lead Channel Pacing Threshold Amplitude: 0.4 V
Lead Channel Pacing Threshold Pulse Width: 0.4 ms
Lead Channel Pacing Threshold Pulse Width: 0.4 ms
Lead Channel Setting Pacing Amplitude: 0.1 V
Lead Channel Setting Pacing Amplitude: 1.4 V
Lead Channel Setting Pacing Amplitude: 2 V
Lead Channel Setting Pacing Pulse Width: 0.4 ms
Lead Channel Setting Pacing Pulse Width: 0.4 ms
Lead Channel Setting Sensing Sensitivity: 0.5 mV
Lead Channel Setting Sensing Sensitivity: 1 mV
Pulse Gen Serial Number: 511069

## 2023-10-17 NOTE — Progress Notes (Deleted)
 Electrophysiology Clinic Note    Date:  10/18/2023  Patient ID:  Evelyn Dunn, Evelyn Dunn 15-Nov-1950, MRN [id number] PCP:  Macie Saxon, MD  Cardiologist:  Sammy Crisp, MD Electrophysiologist: Boyce Byes, MD  ***refresh  Discussed the use of AI scribe software for clinical note transcription with the patient, who gave verbal consent to proceed.   Patient Profile    Chief Complaint: ***  History of Present Illness: Evelyn Dunn is a 73 y.o. female with PMH notable for HFrEF, NICM, s/p CRT-D, CAD, HTN, CKD stage III, mod-severe MR, tobacco use ; seen today for Boyce Byes, MD for routine electrophysiology followup.   I last saw her 04/2023 where she was doing very well. She saw Dr. Nolan Battle 07/2023 where BP was slightly elevated, so coreg  increased. She saw NP Hammock 09/2023, tolerating coreg  so entresto  increased.   On follow-up today, *** BP readings *** taking entresto ? *** fluid status, exercise   - consider jardiance/farxiga ??   Since last being seen in our clinic the patient reports doing ***.  she denies chest pain, palpitations, dyspnea, PND, orthopnea, nausea, vomiting, dizziness, syncope, edema, weight gain, or early satiety.      Arrhythmia/Device History Bos Sci CRT-D, imp 10/2021; dx NICM, CHF  - LV lead in LB position d/t tortuous CS    ROS:  Please see the history of present illness. All other systems are reviewed and otherwise negative.    Physical Exam    VS:  LMP  (LMP Unknown)  BMI: There is no height or weight on file to calculate BMI.  Wt Readings from Last 3 Encounters:  09/24/23 229 lb 6.4 oz (104.1 kg)  08/12/23 225 lb 9.6 oz (102.3 kg)  04/30/23 225 lb (102.1 kg)     GEN- The patient is well appearing, alert and oriented x 3 today.   Lungs- Clear to ausculation bilaterally, normal work of breathing.  Heart- {Blank single:19197::"Regular","Irregularly irregular"} rate and rhythm, no murmurs, rubs or gallops Extremities- {EDEMA  LEVEL:28147::"No"} peripheral edema, warm, dry Skin-  *** device pocket well-healed, no tethering   Device interrogation done today and reviewed by myself:  Battery *** Lead thresholds, impedence, sensing stable *** *** episodes *** changes made today   Studies Reviewed   Previous EP, cardiology notes.    EKG is ordered. Personal review of EKG from today shows:  ***         TTE, 02/27/2022  1. Left ventricular ejection fraction, by estimation, is 30 to 35%. The left ventricle has moderately decreased function. The left ventricle demonstrates global hypokinesis. The left ventricular internal cavity size was mildly dilated. There is mild left ventricular hypertrophy. Left ventricular diastolic parameters are consistent with Grade I diastolic dysfunction (impaired relaxation). The  average left ventricular global longitudinal strain is -7.8 %.   2. Right ventricular systolic function is normal. The right ventricular size is normal. There is normal pulmonary artery systolic pressure. The estimated right ventricular systolic pressure is 23.1 mmHg.   3. The mitral valve is normal in structure. Mild to moderate mitral valve regurgitation. No evidence of mitral stenosis.   4. The aortic valve was not well visualized. Aortic valve regurgitation is mild. No aortic stenosis is present.   5. There is borderline dilatation of the ascending aorta, measuring 38 mm.   6. The inferior vena cava is normal in size with greater than 50% respiratory variability, suggesting right atrial pressure of 3 mmHg.   Comparison(s): 06/01/21-EF25-30% with  moderately leaky valve. Pump function is still low.     Assessment and Plan     #) HFrEF  Titrating GDMT per gen cards Continue 49-51 entresto  BID, 6.25 coreg  BID,    #) CRT-D   #) ***   {Are you ordering a CV Procedure (e.g. stress test, cath, DCCV, TEE, etc)?   Press F2        :[id number]}   Current medicines are reviewed at length with the  patient today.   The patient {ACTIONS; HAS/DOES NOT HAVE:19233} concerns regarding her medicines.  The following changes were made today:  {NONE DEFAULTED:18576}  Labs/ tests ordered today include: *** No orders of the defined types were placed in this encounter.    Disposition: Follow up with {EPMDS:28135} or EP APP {EPFOLLOW UP:28173}   Signed, Adaline Holly, NP  10/18/23  8:34 AM  Electrophysiology [facility]

## 2023-10-18 ENCOUNTER — Ambulatory Visit: Payer: Medicare PPO | Attending: Cardiology | Admitting: Cardiology

## 2023-10-18 DIAGNOSIS — Z95810 Presence of automatic (implantable) cardiac defibrillator: Secondary | ICD-10-CM | POA: Insufficient documentation

## 2023-10-22 ENCOUNTER — Telehealth: Payer: Self-pay | Admitting: Internal Medicine

## 2023-10-22 MED ORDER — ENTRESTO 49-51 MG PO TABS: 1.0000 | ORAL_TABLET | Freq: Two times a day (BID) | ORAL | 3 refills | Status: AC

## 2023-10-22 NOTE — Telephone Encounter (Signed)
*  STAT* If patient is at the pharmacy, call can be transferred to refill team.   1. Which medications need to be refilled? (please list name of each medication and dose if known)   sacubitril -valsartan  (ENTRESTO ) 49-51 MG   2. Would you like to learn more about the convenience, safety, & potential cost savings by using the [facility]?   3. Are you open to using the [facility] (Type [facility]. ).  4. Which pharmacy/location (including street and city if local pharmacy) is medication to be sent to?  [facility] - [place] [address]), [place] - [address]   5. Do they need a 30 day or 90 day supply?   90 day  Patient stated her medication was increased and she is now completely out of this medication.

## 2023-11-28 NOTE — Progress Notes (Signed)
 Remote ICD transmission.

## 2023-12-25 ENCOUNTER — Ambulatory Visit: Attending: Cardiology | Admitting: Cardiology

## 2023-12-25 ENCOUNTER — Encounter: Payer: Self-pay | Admitting: Cardiology

## 2023-12-25 VITALS — BP 118/71 | HR 79 | Ht 66.0 in | Wt 235.2 lb

## 2023-12-25 DIAGNOSIS — I2510 Atherosclerotic heart disease of native coronary artery without angina pectoris: Secondary | ICD-10-CM | POA: Diagnosis not present

## 2023-12-25 DIAGNOSIS — I5022 Chronic systolic (congestive) heart failure: Secondary | ICD-10-CM | POA: Diagnosis not present

## 2023-12-25 DIAGNOSIS — E785 Hyperlipidemia, unspecified: Secondary | ICD-10-CM | POA: Diagnosis not present

## 2023-12-25 DIAGNOSIS — I428 Other cardiomyopathies: Secondary | ICD-10-CM

## 2023-12-25 DIAGNOSIS — Z720 Tobacco use: Secondary | ICD-10-CM

## 2023-12-25 DIAGNOSIS — Z95810 Presence of automatic (implantable) cardiac defibrillator: Secondary | ICD-10-CM

## 2023-12-25 DIAGNOSIS — I10 Essential (primary) hypertension: Secondary | ICD-10-CM

## 2023-12-25 NOTE — Progress Notes (Signed)
 Cardiology Office Note   Date:  12/25/2023  ID:  Evelyn Dunn, DOB [birth year], MRN [id number] PCP: Buren Rock HERO, MD  [facility] Providers Cardiologist:  Lonni Hanson, MD Cardiology APP:  Gerard Frederick, NP  Electrophysiologist:  OLE ONEIDA HOLTS, MD     History of Present Illness Evelyn Dunn is a 73 y.o. female with a past medical history monitored for coronary disease, CKD stage III, HFrEF, nonischemic cardiomyopathy, moderately severe MR, send hypertension, hyperlipidemia, morbid obesity, chronic left bundle branch block, tobacco use, seen in follow-up on her coronary artery disease.   Previous left heart catheterization 11/2020 with left main normal, LAD 25%,  proximal LAD 50 to 65%, D2 with 30% stenosis, left circumflex 30% mid stenosis, OM1 moderate disease, RCA disease, RPDA 20%.  Nonischemic cardiomyopathy history of CAD and started on 10/16/2021.  Echocardiogram completed 11/26/2020 revealed EF 20 to 25%, global hypokinesis, moderately reduced right ventricular function, severely dilated left and right atrium, moderate to severe mitral valve regurgitation, mild aortic valve regurgitation.  Repeat echo 06/01/2021 revealed an EF of 25 to 30%, G1 DD, aortic valve regurgitation was mild, mild to moderate mitral valve regurgitation.  10/16/2021 she underwent BiV ICD insertion with a CRT-D HD.  02/27/2022 echo revealed EF 30-25%, global hypokinesis, G1 DD, mild to moderate mitral regurgitation, mild aortic regurgitation, and borderline dilatation of the ascending aorta measuring 30 mm.  She was seen in clinic 08/08/2023 by Dr. Hanson.  She noticed a rare twinge of pain in her chest that was transient not related to any particular activity.  Her carvedilol  was increased to 625 mg twice daily.  Recommendation was to continue with up titration of carvedilol /Entresto  and follow-up as well as stepwise addition of MRA and SGLT2 inhibitor.  Once GDMT has been optimized repeat echocardiogram was to  be ordered.   She was last seen in clinic in April 2025 seems to be doing well with a cardiac perspective.  She been working on decreasing her smoking.  States she had been compliant with her current medications with any undue side effects.  Entresto  was increased and she was concerned about cost of SGLT2 inhibitors and concerns for MRA due to baseline elevated kidney function.  Was to continue with escalation of GDMT as tolerated.  She returns to clinic today stating that she has been doing well from the cardiac perspective.  She denies any chest pain or shortness of breath.  Denies any peripheral edema.  She states that she has noted increasing weight gain but she also notices changes in her appetite as well.  She states she has been compliant with her current medication regimen with any undue side effects.  Continues to work.  Denies any hospitalizations or visits to the emergency department.  ROS: 10 point review of systems has been reviewed and considered negative except ones been listed in the HPI  Studies Reviewed      TTE 02/27/22  1. Left ventricular ejection fraction, by estimation, is 30 to 35%. The  left ventricle has moderately decreased function. The left ventricle  demonstrates global hypokinesis. The left ventricular internal cavity size  was mildly dilated. There is mild  left ventricular hypertrophy. Left ventricular diastolic parameters are  consistent with Grade I diastolic dysfunction (impaired relaxation). The  average left ventricular global longitudinal strain is -7.8 %.   2. Right ventricular systolic function is normal. The right ventricular  size is normal. There is normal pulmonary artery systolic pressure. The  estimated right  ventricular systolic pressure is 23.1 mmHg.   3. The mitral valve is normal in structure. Mild to moderate mitral valve  regurgitation. No evidence of mitral stenosis.   4. The aortic valve was not well visualized. Aortic valve regurgitation   is mild. No aortic stenosis is present.   5. There is borderline dilatation of the ascending aorta, measuring 38  mm.   6. The inferior vena cava is normal in size with greater than 50%  respiratory variability, suggesting right atrial pressure of 3 mmHg.  Risk Assessment/Calculations           Physical Exam VS:  BP 118/71   Pulse 79   Ht 5' 6 (1.676 m)   Wt 235 lb 3.2 oz (106.7 kg)   LMP  (LMP Unknown)   SpO2 96%   BMI 37.96 kg/m        Wt Readings from Last 3 Encounters:  12/25/23 235 lb 3.2 oz (106.7 kg)  09/24/23 229 lb 6.4 oz (104.1 kg)  08/12/23 225 lb 9.6 oz (102.3 kg)    GEN: Well nourished, well developed in no acute distress NECK: No JVD; No carotid bruits CARDIAC: RRR, no murmurs, rubs, gallops RESPIRATORY:  Clear to auscultation without rales, wheezing or rhonchi  ABDOMEN: Soft, non-tender, non-distended EXTREMITIES:  No edema; No deformity   ASSESSMENT AND PLAN HFrEF/nonischemic cardiomyopathy with an LVEF of 30 to 35% on echocardiogram 02/2022.  Improvement noted after BiV ICD placement.  She continues to remain euvolemic on exam today but does suffer from NYHA class I symptoms.  She denies any recurrent shortness of breath and dyspnea on exertion.  She does note some abdominal bloating.  She is continued on carvedilol  6.25 mg twice daily and Entresto  49/51 mg twice daily.  Discussed initiating SGLT2 inhibitor and she is still concerned about cost.  Unfortunately she has not had stable candidate for MRA therapy as her potassium on most recent labs was 5.2 and she had this elevated kidney function.  She has been scheduled for an updated echocardiogram since increasing her carvedilol  and Entresto .  Coronary artery disease with moderate to severe single-vessel coronary artery disease with 20 to 30% proximal and tandem 50 to 70% mid LAD stenosis.  There is mild disease on the left circumflex and RCA.  Severity of cardiomyopathy was totally out of proportion to degree of  coronary artery disease suggesting nonischemic etiology, moderate to severely elevated left heart filling pressures and severely elevated right heart and pulmonary artery pressures with moderately dues Fick cardiac output/index.  She was recommended for medical therapy and risk factor modification to prevent progression of coronary artery disease on left heart catheterization completed 12/05/2020.  She continues to deny angina or anginal equivalents.  She is continued on aspirin  81 mg daily and rosuvastatin  10 mg daily.  No further ischemic testing is needed at this time.  Nonischemic cardiomyopathy/history of BiV ICD [employer] implanted 10/2021 by Dr. Cindie.  She continues with remote interrogations.  She has been arranged for outpatient follow-up with EP.  Her last visit was in November 2024 was recommended she follow-up in 6 months she was never called about that appointment.  Primary hypertension blood pressure today 118/71.  Blood pressure continues to remain stable.  She is continued on carvedilol  and Entresto .  She has also been encouraged to continue to monitor her blood pressures 1 to 2 hours postmedication titration as well.  Mixed hyperlipidemia with an LDL of 72.  Goal is 70 or less 7.  She has not been continued on rosuvastatin  10 mg daily and advised on the importance of some dietary modifications.  Obesity with a BMI of 37.96.  She has been encouraged to decrease her nighttime snacking and increase her activity as tolerated.  Tobacco abuse which she continues to decrease the amount that she smokes daily.  Total cessation continues to be recommended.        Dispo: Patient to return to clinic to see MD/APP in 6 months or sooner if needed for further evaluation.  Signed, Darion Milewski, NP

## 2023-12-25 NOTE — Patient Instructions (Signed)
 Medication Instructions:  Your physician recommends that you continue on your current medications as directed. Please refer to the Current Medication list given to you today.   *If you need a refill on your cardiac medications before your next appointment, please call your pharmacy*  Lab Work: No labs ordered today  If you have labs (blood work) drawn today and your tests are completely normal, you will receive your results only by: MyChart Message (if you have MyChart) OR A paper copy in the mail If you have any lab test that is abnormal or we need to change your treatment, we will call you to review the results.  Testing/Procedures: Your physician has requested that you have an echocardiogram. Echocardiography is a painless test that uses sound waves to create images of your heart. It provides your doctor with information about the size and shape of your heart and how well your heart's chambers and valves are working.   You may receive an ultrasound enhancing agent through an IV if needed to better visualize your heart during the echo. This procedure takes approximately one hour.  There are no restrictions for this procedure.  This will take place at [address] [facility]) [address], [place] [address]  Please note: We ask at that you not bring children with you during ultrasound (echo/ vascular) testing. Due to room size and safety concerns, children are not allowed in the ultrasound rooms during exams. Our front office staff cannot provide observation of children in our lobby area while testing is being conducted. An adult accompanying a patient to their appointment will only be allowed in the ultrasound room at the discretion of the ultrasound technician under special circumstances. We apologize for any inconvenience.   Follow-Up: At [facility], you and your health needs are our priority.  As part of our continuing mission to provide you with exceptional heart  care, our providers are all part of one team.  This team includes your primary Cardiologist (physician) and Advanced Practice Providers or APPs (Physician Assistants and Nurse Practitioners) who all work together to provide you with the care you need, when you need it.  Your next appointment:   6 month(s)  Provider:   Lonni Hanson, MD or Tylene Lunch, NP    We recommend signing up for the patient portal called MyChart.  Sign up information is provided on this After Visit Summary.  MyChart is used to connect with patients for Virtual Visits (Telemedicine).  Patients are able to view lab/test results, encounter notes, upcoming appointments, etc.  Non-urgent messages can be sent to your provider as well.   To learn more about what you can do with MyChart, go to [email or web address].   Other Instructions Schedule EP with SR or CL

## 2024-01-16 ENCOUNTER — Ambulatory Visit: Payer: Medicare PPO

## 2024-01-16 DIAGNOSIS — I428 Other cardiomyopathies: Secondary | ICD-10-CM

## 2024-01-16 LAB — CUP PACEART REMOTE DEVICE CHECK
Battery Remaining Longevity: 120 mo
Battery Remaining Percentage: 100 %
Brady Statistic RA Percent Paced: 0 %
Brady Statistic RV Percent Paced: 100 %
Date Time Interrogation Session: 20250731005200
HighPow Impedance: 81 Ohm
Implantable Lead Connection Status: 753985
Implantable Lead Connection Status: 753985
Implantable Lead Connection Status: 753985
Implantable Lead Implant Date: 20230501
Implantable Lead Implant Date: 20230501
Implantable Lead Implant Date: 20230501
Implantable Lead Location: 753858
Implantable Lead Location: 753859
Implantable Lead Location: 753860
Implantable Lead Model: 672
Implantable Lead Model: 7841
Implantable Lead Model: 7842
Implantable Lead Serial Number: 1169613
Implantable Lead Serial Number: 1263550
Implantable Lead Serial Number: 185133
Implantable Pulse Generator Implant Date: 20230501
Lead Channel Impedance Value: 526 Ohm
Lead Channel Impedance Value: 625 Ohm
Lead Channel Impedance Value: 782 Ohm
Lead Channel Pacing Threshold Amplitude: 0.4 V
Lead Channel Pacing Threshold Amplitude: 0.4 V
Lead Channel Pacing Threshold Pulse Width: 0.4 ms
Lead Channel Pacing Threshold Pulse Width: 0.4 ms
Lead Channel Setting Pacing Amplitude: 0.1 V
Lead Channel Setting Pacing Amplitude: 1.5 V
Lead Channel Setting Pacing Amplitude: 2.6 V
Lead Channel Setting Pacing Pulse Width: 0.4 ms
Lead Channel Setting Pacing Pulse Width: 0.4 ms
Lead Channel Setting Sensing Sensitivity: 0.5 mV
Lead Channel Setting Sensing Sensitivity: 1 mV
Pulse Gen Serial Number: 511069

## 2024-01-20 ENCOUNTER — Ambulatory Visit: Payer: Self-pay | Admitting: Cardiology

## 2024-01-30 ENCOUNTER — Ambulatory Visit: Attending: Cardiology

## 2024-01-30 DIAGNOSIS — I5022 Chronic systolic (congestive) heart failure: Secondary | ICD-10-CM

## 2024-01-30 LAB — ECHOCARDIOGRAM COMPLETE
AR max vel: 2.32 cm2
AV Area VTI: 2.56 cm2
AV Area mean vel: 2.31 cm2
AV Mean grad: 5 mmHg
AV Peak grad: 9.4 mmHg
Ao pk vel: 1.53 m/s
Area-P 1/2: 2.48 cm2
S' Lateral: 3.28 cm

## 2024-01-31 ENCOUNTER — Ambulatory Visit: Payer: Self-pay | Admitting: Cardiology

## 2024-01-31 NOTE — Progress Notes (Signed)
 Heart squeeze is noted at 35-40% which is a slight improvement from prior study of 30-35%.  There is some muscle stiffness from blood pressure and age noted which causes an impaired relaxation.  There is mild to moderate leakage into of the valves which has remained stable.  Recommend continuing surveillance studies every 2-3 years for evaluate valvular function.  Continue with current medication regimen with no changes needed at this time.

## 2024-02-10 NOTE — Progress Notes (Unsigned)
 Electrophysiology Clinic Note    Date:  02/11/2024  Patient ID:  Evelyn, Dunn Nov 11, 1950, MRN [id number] PCP:  Buren Rock HERO, MD  Cardiologist:  Lonni Hanson, MD   Cardiology APP:  Gerard Frederick, NP  Electrophysiologist:  OLE ONEIDA HOLTS, MD  Electrophysiology APP:  Egon Dittus, NP    Discussed the use of AI scribe software for clinical note transcription with the patient, who gave verbal consent to proceed.   Patient Profile    Chief Complaint: CRT follow-up  History of Present Illness: Evelyn Dunn is a 73 y.o. female with PMH notable for HFrEF, NICM, s/p CRT-D, CAD, HTN, CKD stage III, mod-severe MR, tobacco use ; seen today for OLE ONEIDA HOLTS, MD for routine electrophysiology followup.   I last saw her 04/2023 where she was feeling very well.  She saw NP Hammock 12/2023 where weight was increased, ?dietary indiscretions. Planned for updated TTE that showed slight improvement in LVEF to 35-40%.  On follow-up today, she is doing well. She is frustrated by her ongoing weight gain, but has not increased her physical activity or changed her diet. She denies chest pain, chest pressure, palpitations. She denies increased edema   Arrhythmia/Device History Bos Sci CRT-D, imp 10/2021; dx NICM, CHF  - LV lead in LB position d/t tortuous CS    ROS:  Please see the history of present illness. All other systems are reviewed and otherwise negative.    Physical Exam    VS:  BP 102/62 (BP Location: Left Arm, Patient Position: Sitting, Cuff Size: Normal)   Pulse 82   Ht 5' 6 (1.676 m)   Wt 235 lb (106.6 kg)   LMP  (LMP Unknown)   SpO2 97%   BMI 37.93 kg/m  BMI: Body mass index is 37.93 kg/m.      Wt Readings from Last 3 Encounters:  02/11/24 235 lb (106.6 kg)  12/25/23 235 lb 3.2 oz (106.7 kg)  09/24/23 229 lb 6.4 oz (104.1 kg)     GEN- The patient is well appearing, alert and oriented x 3 today.   Lungs- Clear to ausculation bilaterally, normal  work of breathing.  Heart- Regular rate and rhythm, no murmurs, rubs or gallops Extremities- No peripheral edema, warm, dry Skin-  device pocket well-healed, no tethering   Device interrogation done today and reviewed by myself:  Battery 10y Lead thresholds, impedence, sensing stable  No episodes No changes made today   Studies Reviewed   Previous EP, cardiology notes.    EKG is ordered. Personal review of EKG from today shows:    EKG Interpretation Date/Time:  Tuesday February 11 2024 13:11:00 EDT Ventricular Rate:  82 PR Interval:  136 QRS Duration:  160 QT Interval:  460 QTC Calculation: 537 R Axis:   16  Text Interpretation: Atrial-sensed ventricular-paced rhythm Confirmed by Emmalee Solivan [phone]) on 02/11/2024 1:13:05 PM    TTE, 01/30/2024  1. Left ventricular ejection fraction, by estimation, is 35 to 40%. The left ventricle has moderately decreased function. The left ventricle demonstrates global hypokinesis. There is moderate left ventricular hypertrophy. Left ventricular diastolic parameters are consistent with Grade I diastolic dysfunction (impaired relaxation).   2. Right ventricular systolic function is normal. The right ventricular size is normal. There is normal pulmonary artery systolic pressure. The estimated right ventricular systolic pressure is 28.6 mmHg.   3. The mitral valve is normal in structure. No evidence of mitral valve regurgitation. No evidence of mitral stenosis.  4. Tricuspid valve regurgitation is mild to moderate.   5. The aortic valve is normal in structure. Aortic valve regurgitation is mild to moderate. No aortic stenosis is present.   6. The inferior vena cava is normal in size with greater than 50% respiratory variability, suggesting right atrial pressure of 3 mmHg.   TTE, 02/27/2022  1. Left ventricular ejection fraction, by estimation, is 30 to 35%. The left ventricle has moderately decreased function. The left ventricle demonstrates global  hypokinesis. The left ventricular internal cavity size was mildly dilated. There is mild left ventricular hypertrophy. Left ventricular diastolic parameters are consistent with Grade I diastolic dysfunction (impaired relaxation). The average left ventricular global longitudinal strain is -7.8 %.   2. Right ventricular systolic function is normal. The right ventricular size is normal. There is normal pulmonary artery systolic pressure. The estimated right ventricular systolic pressure is 23.1 mmHg.   3. The mitral valve is normal in structure. Mild to moderate mitral valve regurgitation. No evidence of mitral stenosis.   4. The aortic valve was not well visualized. Aortic valve regurgitation is mild. No aortic stenosis is present.   5. There is borderline dilatation of the ascending aorta, measuring 38 mm.   6. The inferior vena cava is normal in size with greater than 50% respiratory variability, suggesting right atrial pressure of 3 mmHg.   Comparison(s): 06/01/21-EF25-30% with moderately leaky valve. Pump function is still low.    Assessment and Plan     #) NICM s/p CRT-D Device functioning well, see paceart for details Lead measurements stable No arrhythmias   #) HFrEF, improved to 35-40%, previously 25-30% Appears warm and euvolemic on exam GDMT: coreg  6.25mg  BID, entresto  49-51 BID Unable to initiate MRA d/t hyperK Consider SGLT2 in future, borderline BP today so will re-assess at follow-up       Current medicines are reviewed at length with the patient today.   The patient does not have concerns regarding her medicines.  The following changes were made today:  none  Labs/ tests ordered today include:  Orders Placed This Encounter  Procedures   EKG 12-Lead     Disposition: Follow up with Dr. Cindie or EP APP in 12 months, continue q14mon remote monitoring   Signed, Chantal Needle, NP  02/11/24  2:08 PM  Electrophysiology [facility]

## 2024-02-11 ENCOUNTER — Other Ambulatory Visit (HOSPITAL_COMMUNITY): Payer: Self-pay

## 2024-02-11 ENCOUNTER — Ambulatory Visit: Attending: Cardiology | Admitting: Cardiology

## 2024-02-11 ENCOUNTER — Encounter: Payer: Self-pay | Admitting: Cardiology

## 2024-02-11 VITALS — BP 102/62 | HR 82 | Ht 66.0 in | Wt 235.0 lb

## 2024-02-11 DIAGNOSIS — Z95810 Presence of automatic (implantable) cardiac defibrillator: Secondary | ICD-10-CM

## 2024-02-11 DIAGNOSIS — I5022 Chronic systolic (congestive) heart failure: Secondary | ICD-10-CM | POA: Diagnosis not present

## 2024-02-11 DIAGNOSIS — I428 Other cardiomyopathies: Secondary | ICD-10-CM

## 2024-02-11 LAB — CUP PACEART INCLINIC DEVICE CHECK
Date Time Interrogation Session: 20250826141341
HighPow Impedance: 91 Ohm
Implantable Lead Connection Status: 753985
Implantable Lead Connection Status: 753985
Implantable Lead Connection Status: 753985
Implantable Lead Implant Date: 20230501
Implantable Lead Implant Date: 20230501
Implantable Lead Implant Date: 20230501
Implantable Lead Location: 753858
Implantable Lead Location: 753859
Implantable Lead Location: 753860
Implantable Lead Model: 672
Implantable Lead Model: 7841
Implantable Lead Model: 7842
Implantable Lead Serial Number: 1169613
Implantable Lead Serial Number: 1263550
Implantable Lead Serial Number: 185133
Implantable Pulse Generator Implant Date: 20230501
Lead Channel Impedance Value: 540 Ohm
Lead Channel Impedance Value: 651 Ohm
Lead Channel Impedance Value: 778 Ohm
Lead Channel Pacing Threshold Amplitude: 0.4 V
Lead Channel Pacing Threshold Amplitude: 0.5 V
Lead Channel Pacing Threshold Amplitude: 0.7 V
Lead Channel Pacing Threshold Pulse Width: 0.4 ms
Lead Channel Pacing Threshold Pulse Width: 0.4 ms
Lead Channel Pacing Threshold Pulse Width: 0.4 ms
Lead Channel Sensing Intrinsic Amplitude: 20.6 mV
Lead Channel Sensing Intrinsic Amplitude: 25 mV
Lead Channel Sensing Intrinsic Amplitude: 5.2 mV
Lead Channel Setting Pacing Amplitude: 0.1 V
Lead Channel Setting Pacing Amplitude: 1.5 V
Lead Channel Setting Pacing Amplitude: 2 V
Lead Channel Setting Pacing Pulse Width: 0.4 ms
Lead Channel Setting Pacing Pulse Width: 0.4 ms
Lead Channel Setting Sensing Sensitivity: 0.5 mV
Lead Channel Setting Sensing Sensitivity: 1 mV
Pulse Gen Serial Number: 511069

## 2024-02-11 NOTE — TOC Benefit Eligibility Note (Signed)
 Pharmacy Patient Advocate Encounter  Insurance verification completed.    The patient is insured through [employer]. Patient has Medicare and is not eligible for a copay card, but may be able to apply for patient assistance or Medicare RX Payment Plan (Patient Must reach out to their plan, if eligible for payment plan), if available.    Ran test claim for Jardiance 10mg   and the current 30 day co-pay is $12.15.   This test claim was processed through [facility]- copay amounts may vary at other pharmacies due to pharmacy/plan contracts, or as the patient moves through the different stages of their insurance plan.

## 2024-02-11 NOTE — Patient Instructions (Signed)
 Medication Instructions:  Your physician recommends that you continue on your current medications as directed. Please refer to the Current Medication list given to you today.    *If you need a refill on your cardiac medications before your next appointment, please call your pharmacy*  Lab Work: No labs ordered today    Testing/Procedures: No test ordered today   Follow-Up: At [facility], you and your health needs are our priority.  As part of our continuing mission to provide you with exceptional heart care, our providers are all part of one team.  This team includes your primary Cardiologist (physician) and Advanced Practice Providers or APPs (Physician Assistants and Nurse Practitioners) who all work together to provide you with the care you need, when you need it.  Your next appointment:   1 year(s)  Provider:   Ole Holts, MD or Suzann Riddle, NP

## 2024-02-12 ENCOUNTER — Ambulatory Visit: Payer: Self-pay | Admitting: Cardiology

## 2024-03-18 NOTE — Progress Notes (Signed)
 Remote ICD Transmission

## 2024-04-16 ENCOUNTER — Ambulatory Visit (INDEPENDENT_AMBULATORY_CARE_PROVIDER_SITE_OTHER): Payer: Medicare PPO

## 2024-04-16 DIAGNOSIS — I428 Other cardiomyopathies: Secondary | ICD-10-CM | POA: Diagnosis not present

## 2024-04-17 LAB — CUP PACEART REMOTE DEVICE CHECK
Battery Remaining Longevity: 114 mo
Battery Remaining Percentage: 100 %
Brady Statistic RA Percent Paced: 0 %
Brady Statistic RV Percent Paced: 100 %
Date Time Interrogation Session: 20251030005000
HighPow Impedance: 85 Ohm
Implantable Lead Connection Status: 753985
Implantable Lead Connection Status: 753985
Implantable Lead Connection Status: 753985
Implantable Lead Implant Date: 20230501
Implantable Lead Implant Date: 20230501
Implantable Lead Implant Date: 20230501
Implantable Lead Location: 753858
Implantable Lead Location: 753859
Implantable Lead Location: 753860
Implantable Lead Model: 672
Implantable Lead Model: 7841
Implantable Lead Model: 7842
Implantable Lead Serial Number: 1169613
Implantable Lead Serial Number: 1263550
Implantable Lead Serial Number: 185133
Implantable Pulse Generator Implant Date: 20230501
Lead Channel Impedance Value: 513 Ohm
Lead Channel Impedance Value: 582 Ohm
Lead Channel Impedance Value: 696 Ohm
Lead Channel Pacing Threshold Amplitude: 0.5 V
Lead Channel Pacing Threshold Amplitude: 0.5 V
Lead Channel Pacing Threshold Pulse Width: 0.4 ms
Lead Channel Pacing Threshold Pulse Width: 0.4 ms
Lead Channel Setting Pacing Amplitude: 0.1 V
Lead Channel Setting Pacing Amplitude: 1.5 V
Lead Channel Setting Pacing Amplitude: 5 V
Lead Channel Setting Pacing Pulse Width: 0.4 ms
Lead Channel Setting Pacing Pulse Width: 0.4 ms
Lead Channel Setting Sensing Sensitivity: 0.5 mV
Lead Channel Setting Sensing Sensitivity: 1 mV
Pulse Gen Serial Number: 511069

## 2024-04-21 ENCOUNTER — Ambulatory Visit: Payer: Self-pay | Admitting: Cardiology

## 2024-04-21 NOTE — Progress Notes (Signed)
 Remote ICD Transmission

## 2024-07-02 ENCOUNTER — Encounter: Payer: Self-pay | Admitting: Cardiology

## 2024-07-02 ENCOUNTER — Ambulatory Visit: Attending: Cardiology | Admitting: Cardiology

## 2024-07-02 VITALS — BP 150/78 | HR 68 | Ht 66.0 in | Wt 237.4 lb

## 2024-07-02 DIAGNOSIS — Z79899 Other long term (current) drug therapy: Secondary | ICD-10-CM | POA: Diagnosis not present

## 2024-07-02 DIAGNOSIS — I5022 Chronic systolic (congestive) heart failure: Secondary | ICD-10-CM | POA: Diagnosis not present

## 2024-07-02 DIAGNOSIS — E785 Hyperlipidemia, unspecified: Secondary | ICD-10-CM

## 2024-07-02 DIAGNOSIS — Z720 Tobacco use: Secondary | ICD-10-CM

## 2024-07-02 DIAGNOSIS — I2510 Atherosclerotic heart disease of native coronary artery without angina pectoris: Secondary | ICD-10-CM

## 2024-07-02 DIAGNOSIS — Z95810 Presence of automatic (implantable) cardiac defibrillator: Secondary | ICD-10-CM

## 2024-07-02 DIAGNOSIS — I10 Essential (primary) hypertension: Secondary | ICD-10-CM

## 2024-07-02 DIAGNOSIS — I428 Other cardiomyopathies: Secondary | ICD-10-CM

## 2024-07-02 MED ORDER — CARVEDILOL 6.25 MG PO TABS: 6.2500 mg | ORAL_TABLET | Freq: Two times a day (BID) | ORAL | 3 refills | Status: AC

## 2024-07-02 MED ORDER — ROSUVASTATIN CALCIUM 10 MG PO TABS: 10.0000 mg | ORAL_TABLET | Freq: Every day | ORAL | 3 refills | Status: AC

## 2024-07-02 NOTE — Progress Notes (Signed)
 " Cardiology Office Note   Date:  07/02/2024  ID:  Evelyn Dunn, DOB 05-04-1951, MRN [id number] PCP: [place]ren Rock HERO, MD  [facility] Providers Cardiologist:  Lonni Hanson, MD Cardiology APP:  Gerard Frederick, NP  Electrophysiologist:  OLE ONEIDA HOLTS, MD (Inactive)  Electrophysiology APP:  Riddle, Suzann, NP     History of Present Illness Evelyn Dunn is a 74 y.o. female with a past medical history of nonobstructive coronary artery disease, CKD stage III, HFrEF, nonischemic cardiomyopathy, status post CRT-D (10/2021),  moderate to severe MR, primary hypertension, hyperlipidemia, morbid obesity, chronic left bundle branch block, tobacco use, who is here today for follow-up.   Previous left heart catheterization 11/2020 with left main normal, LAD 25%,  proximal LAD 50 to 65%, D2 with 30% stenosis, left circumflex 30% mid stenosis, OM1 moderate disease, RCA disease, RPDA 20%.  Nonischemic cardiomyopathy history of CAD and started on 10/16/2021.  Echocardiogram completed 11/26/2020 revealed EF 20 to 25%, global hypokinesis, moderately reduced right ventricular function, severely dilated left and right atrium, moderate to severe mitral valve regurgitation, mild aortic valve regurgitation.  Repeat echo 06/01/2021 revealed an EF of 25 to 30%, G1 DD, aortic valve regurgitation was mild, mild to moderate mitral valve regurgitation.  10/16/2021 she underwent BiV ICD insertion with a CRT-D HD.  02/27/2022 echo revealed EF 30-25%, global hypokinesis, G1 DD, mild to moderate mitral regurgitation, mild aortic regurgitation, and borderline dilatation of the ascending aorta measuring 30 mm.  She was seen in clinic 08/08/2023 by Dr. Hanson.  She noticed a rare twinge of pain in her chest that was transient not related to any particular activity.  Her carvedilol  was increased to 625 mg twice daily.  Recommendation was to continue with up titration of carvedilol /Entresto  and follow-up as well as stepwise addition of  MRA and SGLT2 inhibitor.  Once GDMT has been optimized repeat echocardiogram was to be ordered.  She was evaluated in clinic 09/2023 doing well from a cardiac perspective.  She had been working on decreasing her smoking.  States she had been compliant with her current medications.  Entresto  was increased and she continued to be concerned about the cost of SGLT2 inhibitors.  MRA therapy was not started due to elevated kidney function at baseline.  She was last seen in clinic 12/25/2023 stating she was doing well from a cardiac perspective.  She had been compliant with her current medication regimen without any undue side effects.  She was continued on her current medication regimen and scheduled for an updated echocardiogram.   She returns to clinic today stating that she has been doing well from a cardiac perspective.  She denies any chest pain or shortness of breath.  Denies any peripheral edema palpitations lightheaded or dizziness.  Blood pressure is slightly elevated today and she has not had her medications as of yet.  She is under increased amount of stress dealing with her brother that lives with her that unfortunately is in an amputee and an alcoholic.  She denies any recent hospitalizations or visits to the emergency department.  ROS: 10 point review of systems has been reviewed and considered negative the exception was been listed in the HPI  Studies Reviewed EKG Interpretation Date/Time:  Thursday July 02 2024 11:19:55 EST Ventricular Rate:  68 PR Interval:    QRS Duration:  178 QT Interval:  498 QTC Calculation: 529 R Axis:   -6  Text Interpretation: Ventricular-paced rhythm When compared with ECG of 11-Feb-2024 13:11, No  significant change was found Confirmed by Gerard Frederick ([id number]) on 07/02/2024 11:25:39 AM   2d echo 01/30/2024  1. Left ventricular ejection fraction, by estimation, is 35 to 40%. The  left ventricle has moderately decreased function. The left ventricle   demonstrates global hypokinesis. There is moderate left ventricular  hypertrophy. Left ventricular diastolic  parameters are consistent with Grade I diastolic dysfunction (impaired  relaxation).   2. Right ventricular systolic function is normal. The right ventricular  size is normal. There is normal pulmonary artery systolic pressure. The  estimated right ventricular systolic pressure is 28.6 mmHg.   3. The mitral valve is normal in structure. No evidence of mitral valve  regurgitation. No evidence of mitral stenosis.   4. Tricuspid valve regurgitation is mild to moderate.   5. The aortic valve is normal in structure. Aortic valve regurgitation is  mild to moderate. No aortic stenosis is present.   6. The inferior vena cava is normal in size with greater than 50%  respiratory variability, suggesting right atrial pressure of 3 mmHg.   TTE 02/27/22  1. Left ventricular ejection fraction, by estimation, is 30 to 35%. The  left ventricle has moderately decreased function. The left ventricle  demonstrates global hypokinesis. The left ventricular internal cavity size  was mildly dilated. There is mild  left ventricular hypertrophy. Left ventricular diastolic parameters are  consistent with Grade I diastolic dysfunction (impaired relaxation). The  average left ventricular global longitudinal strain is -7.8 %.   2. Right ventricular systolic function is normal. The right ventricular  size is normal. There is normal pulmonary artery systolic pressure. The  estimated right ventricular systolic pressure is 23.1 mmHg.   3. The mitral valve is normal in structure. Mild to moderate mitral valve  regurgitation. No evidence of mitral stenosis.   4. The aortic valve was not well visualized. Aortic valve regurgitation  is mild. No aortic stenosis is present.   5. There is borderline dilatation of the ascending aorta, measuring 38  mm.   6. The inferior vena cava is normal in size with greater than  50%  respiratory variability, suggesting right atrial pressure of 3 mmHg.   Risk Assessment/Calculations   HYPERTENSION CONTROL Vitals:   07/02/24 1115 07/02/24 1130  BP: (!) 140/76 (!) 150/78    The patient's blood pressure is elevated above target today.  In order to address the patient's elevated BP: Blood pressure will be monitored at home to determine if medication changes need to be made. (Patient has not had any medications at this time)          Physical Exam VS:  BP (!) 150/78 (BP Location: Left Arm, Patient Position: Sitting, Cuff Size: Large)   Pulse 68   Ht 5' 6 (1.676 m)   Wt 237 lb 6.4 oz (107.7 kg)   LMP  (LMP Unknown)   SpO2 95%   BMI 38.32 kg/m        Wt Readings from Last 3 Encounters:  07/02/24 237 lb 6.4 oz (107.7 kg)  02/11/24 235 lb (106.6 kg)  12/25/23 235 lb 3.2 oz (106.7 kg)    GEN: Well nourished, well developed in no acute distress NECK: No JVD; No carotid bruits CARDIAC: RRR, no murmurs, rubs, gallops RESPIRATORY:  Clear to auscultation without rales, wheezing or rhonchi  ABDOMEN: Soft, non-tender, non-distended EXTREMITIES:  No edema; No deformity   ASSESSMENT AND PLAN HFrEF/nonischemic cardiomyopathy with an LVEF of 30-35% on echocardiogram in 02/2022.  Improvement noted after BiV  ICD placement.  She continues to remain euvolemic on exam and suffers from NYHA class I symptoms.  Denies any recurrent shortness of breath or dyspnea on exertion.  She is continued on Entresto  49/51 mg twice daily, carvedilol  6.25 mg twice daily.  She has not had candidate for SGLT2 inhibitor therapy due to cost and with high normal potassium levels MRA therapy has not initiated.  She has been scheduled for an updated CBC and BMP.  Last labs were in April of last year.  Coronary artery disease with moderate to severe single-vessel coronary artery disease in 2020-30% proximal diagonal and 50/70% mid LAD stenosis.  She continues to remain chest pain.  Denies any  symptoms of decompensation.  EKG today reveals ventricular paced rhythm with a rate of 68.  She is continued on aspirin  81 mg daily and rosuvastatin  10 mg daily.  No further ischemic evaluation is needed at this time.    Nonischemic cardiomyopathy/history of BiV ICD [employer] implanted 10/2021 with Dr. Cindie.  She continues to do remote uploads.  I will need regular appointments with EP for in person visit at least once per year. Last visit was 04/2023.  Primary hypertension with a blood pressure today 140/76 and then 150/78.  She has not had any of her medications as of yet today.  She is upset about the actions of her brother that she cares for.  She is continued on carvedilol  6.25 mg twice daily and Entresto  49/51 mg twice daily.  She has been encouraged to continue to monitor pressures 1 to 2 hours postmedication administration at home as well.  Mixed hyperlipidemia with last LDL 72.  Goal is 70 or less.  She is on rosuvastatin  10 mg daily and scheduled for an updated lipid panel in April 2026 with routine labs.  Obesity with a BMI of 38.32.  She has been encouraged to increase her nighttime snacking and increase her activity as tolerated.  Weight loss would be beneficial.  Tobacco abuse with total cessation continue to be recommended.       Dispo: Patient to return to clinic to see MD/APP in 6 months or sooner if needed for further evaluation.  Signed, Randall Rampersad, NP   "

## 2024-07-02 NOTE — Patient Instructions (Signed)
 Medication Instructions:  Your physician recommends that you continue on your current medications as directed. Please refer to the Current Medication list given to you today.   *If you need a refill on your cardiac medications before your next appointment, please call your pharmacy*  Lab Work: Your provider would like for you to return in April to have the following labs drawn: CBC, BMP, Lipid.   Please go to [facility] [address] ([facility] [address], [place] [address] You do not need an appointment.  They are open from 8 am- 4:30 pm.  Lunch from 1:00 pm- 2:00 pm You DO need to be fasting.   If you have labs (blood work) drawn today and your tests are completely normal, you will receive your results only by: MyChart Message (if you have MyChart) OR A paper copy in the mail If you have any lab test that is abnormal or we need to change your treatment, we will call you to review the results.  Testing/Procedures: No test ordered today   Follow-Up: At [facility], you and your health needs are our priority.  As part of our continuing mission to provide you with exceptional heart care, our providers are all part of one team.  This team includes your primary Cardiologist (physician) and Advanced Practice Providers or APPs (Physician Assistants and Nurse Practitioners) who all work together to provide you with the care you need, when you need it.  Your next appointment:   6 month(s)  Provider:   You may see Lonni Hanson, MD or one of the following Advanced Practice Providers on your designated Care Team:   Tylene Lunch, NP   We recommend signing up for the patient portal called MyChart.  Sign up information is provided on this After Visit Summary.  MyChart is used to connect with patients for Virtual Visits (Telemedicine).  Patients are able to view lab/test results, encounter notes, upcoming appointments, etc.  Non-urgent messages can be sent to  your provider as well.   To learn more about what you can do with MyChart, go to [email or web address].

## 2024-07-16 ENCOUNTER — Ambulatory Visit

## 2024-07-16 DIAGNOSIS — I428 Other cardiomyopathies: Secondary | ICD-10-CM

## 2024-07-16 LAB — CUP PACEART REMOTE DEVICE CHECK
Battery Remaining Longevity: 114 mo
Battery Remaining Percentage: 100 %
Brady Statistic RA Percent Paced: 0 %
Brady Statistic RV Percent Paced: 100 %
Date Time Interrogation Session: 20260129005000
HighPow Impedance: 84 Ohm
Implantable Lead Connection Status: 753985
Implantable Lead Connection Status: 753985
Implantable Lead Connection Status: 753985
Implantable Lead Implant Date: 20230501
Implantable Lead Implant Date: 20230501
Implantable Lead Implant Date: 20230501
Implantable Lead Location: 753858
Implantable Lead Location: 753859
Implantable Lead Location: 753860
Implantable Lead Model: 672
Implantable Lead Model: 7841
Implantable Lead Model: 7842
Implantable Lead Serial Number: 1169613
Implantable Lead Serial Number: 1263550
Implantable Lead Serial Number: 185133
Implantable Pulse Generator Implant Date: 20230501
Lead Channel Impedance Value: 518 Ohm
Lead Channel Impedance Value: 578 Ohm
Lead Channel Impedance Value: 730 Ohm
Lead Channel Pacing Threshold Amplitude: 0.5 V
Lead Channel Pacing Threshold Amplitude: 0.5 V
Lead Channel Pacing Threshold Pulse Width: 0.4 ms
Lead Channel Pacing Threshold Pulse Width: 0.4 ms
Lead Channel Setting Pacing Amplitude: 0.1 V
Lead Channel Setting Pacing Amplitude: 1.6 V
Lead Channel Setting Pacing Amplitude: 2 V
Lead Channel Setting Pacing Pulse Width: 0.4 ms
Lead Channel Setting Pacing Pulse Width: 0.4 ms
Lead Channel Setting Sensing Sensitivity: 0.5 mV
Lead Channel Setting Sensing Sensitivity: 1 mV
Pulse Gen Serial Number: 511069

## 2024-07-21 NOTE — Progress Notes (Signed)
 Remote ICD Transmission

## 2024-10-15 ENCOUNTER — Ambulatory Visit

## 2024-12-10 ENCOUNTER — Ambulatory Visit: Admitting: Cardiology

## 2025-01-14 ENCOUNTER — Ambulatory Visit

## 2025-04-15 ENCOUNTER — Ambulatory Visit

## 2025-07-15 ENCOUNTER — Ambulatory Visit

## 2025-10-14 ENCOUNTER — Ambulatory Visit
